# Patient Record
Sex: Female | Born: 1950
Health system: Southern US, Community
[De-identification: ages and names within clinical notes are randomized; demographics above are authoritative.]

## PROBLEM LIST (undated history)

## (undated) DIAGNOSIS — E559 Vitamin D deficiency, unspecified: Secondary | ICD-10-CM

## (undated) DIAGNOSIS — I10 Essential (primary) hypertension: Secondary | ICD-10-CM

## (undated) DIAGNOSIS — R55 Syncope and collapse: Secondary | ICD-10-CM

## (undated) DIAGNOSIS — Z780 Asymptomatic menopausal state: Secondary | ICD-10-CM

## (undated) DIAGNOSIS — R4584 Anhedonia: Secondary | ICD-10-CM

## (undated) DIAGNOSIS — Z9189 Other specified personal risk factors, not elsewhere classified: Secondary | ICD-10-CM

## (undated) DIAGNOSIS — R413 Other amnesia: Secondary | ICD-10-CM

## (undated) DIAGNOSIS — I6529 Occlusion and stenosis of unspecified carotid artery: Secondary | ICD-10-CM

## (undated) DIAGNOSIS — E785 Hyperlipidemia, unspecified: Secondary | ICD-10-CM

## (undated) DIAGNOSIS — B019 Varicella without complication: Secondary | ICD-10-CM

## (undated) DIAGNOSIS — F419 Anxiety disorder, unspecified: Secondary | ICD-10-CM

## (undated) DIAGNOSIS — M810 Age-related osteoporosis without current pathological fracture: Secondary | ICD-10-CM

## (undated) DIAGNOSIS — S43429A Sprain of unspecified rotator cuff capsule, initial encounter: Secondary | ICD-10-CM

## (undated) DIAGNOSIS — R319 Hematuria, unspecified: Secondary | ICD-10-CM

## (undated) HISTORY — DX: Sprain of unspecified rotator cuff capsule, initial encounter: S43.429A

## (undated) HISTORY — DX: Vitamin D deficiency, unspecified: E55.9

## (undated) HISTORY — DX: Other specified personal risk factors, not elsewhere classified: Z91.89

## (undated) HISTORY — DX: Age-related osteoporosis without current pathological fracture: M81.0

## (undated) HISTORY — DX: Varicella without complication: B01.9

## (undated) HISTORY — DX: Anxiety disorder, unspecified: F41.9

## (undated) HISTORY — DX: Essential (primary) hypertension: I10

## (undated) HISTORY — DX: Anhedonia: R45.84

## (undated) HISTORY — DX: Asymptomatic menopausal state: Z78.0

## (undated) HISTORY — DX: Hyperlipidemia, unspecified: E78.5

## (undated) HISTORY — DX: Other amnesia: R41.3

---
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
## 2004-08-11 LAB — HM COLONOSCOPY

## 2015-01-01 LAB — HM COLONOSCOPY

## 2017-12-19 DIAGNOSIS — R319 Hematuria, unspecified: Secondary | ICD-10-CM

## 2017-12-19 HISTORY — DX: Hematuria, unspecified: R31.9

## 2018-02-21 LAB — HM MAMMOGRAPHY

## 2018-09-28 DIAGNOSIS — R55 Syncope and collapse: Secondary | ICD-10-CM

## 2018-09-28 HISTORY — DX: Syncope and collapse: R55

## 2018-10-24 HISTORY — PX: ELECTROCARDIOGRAM: SHX264

## 2018-10-24 HISTORY — PX: US CAROTID DOPPLER BILATERAL (ARMC HX): HXRAD1402

## 2018-10-24 LAB — BASIC METABOLIC PANEL
BUN: 14 (ref 4–21)
Creatinine: 0.9 (ref 0.5–1.1)
Sodium: 133 — AB (ref 137–147)

## 2018-10-24 LAB — TSH: TSH: 1.53 (ref 0.41–5.90)

## 2018-10-24 LAB — CBC AND DIFFERENTIAL: WBC: 4.8

## 2018-10-25 HISTORY — PX: OTHER SURGICAL HISTORY: SHX169

## 2018-11-26 HISTORY — PX: TRANSTHORACIC ECHOCARDIOGRAM: SHX275

## 2019-01-10 HISTORY — PX: OTHER SURGICAL HISTORY: SHX169

## 2019-02-20 ENCOUNTER — Other Ambulatory Visit: Payer: Self-pay

## 2019-02-20 ENCOUNTER — Encounter: Payer: Self-pay | Admitting: Family Medicine

## 2019-02-20 ENCOUNTER — Ambulatory Visit (INDEPENDENT_AMBULATORY_CARE_PROVIDER_SITE_OTHER): Payer: Medicare Other | Admitting: Family Medicine

## 2019-02-20 VITALS — BP 144/88 | HR 63 | Temp 98.2°F | Resp 16 | Ht 63.39 in | Wt 155.2 lb

## 2019-02-20 DIAGNOSIS — I10 Essential (primary) hypertension: Secondary | ICD-10-CM

## 2019-02-20 DIAGNOSIS — Z0000 Encounter for general adult medical examination without abnormal findings: Secondary | ICD-10-CM

## 2019-02-20 DIAGNOSIS — H8110 Benign paroxysmal vertigo, unspecified ear: Secondary | ICD-10-CM

## 2019-02-20 DIAGNOSIS — T671XXA Heat syncope, initial encounter: Secondary | ICD-10-CM

## 2019-02-20 DIAGNOSIS — E663 Overweight: Secondary | ICD-10-CM | POA: Insufficient documentation

## 2019-02-20 DIAGNOSIS — E782 Mixed hyperlipidemia: Secondary | ICD-10-CM

## 2019-02-20 MED ORDER — ROSUVASTATIN CALCIUM 10 MG PO TABS: 10.0000 mg | ORAL_TABLET | Freq: Every day | ORAL | 1 refills | Status: DC

## 2019-02-20 MED ORDER — TRIAMTERENE-HCTZ 37.5-25 MG PO TABS: 1.0000 | ORAL_TABLET | Freq: Every day | ORAL | 1 refills | Status: DC

## 2019-02-20 MED ORDER — BUPROPION HCL ER (XL) 300 MG PO TB24: 300.0000 mg | ORAL_TABLET | Freq: Every day | ORAL | 1 refills | Status: DC

## 2019-02-20 NOTE — Patient Instructions (Addendum)
Hydrate 80 ounces of water a day.  It was a pleasure meeting you today.  Please help [place] help you:  We are honored you have chosen [facility] for your Primary Care home. Below you will find basic instructions that you may need to access in the future. Please help [place] help you by reading the instructions, which cover many of the frequent questions we experience.   Prescription refills and request:  -In order to allow more efficient response time, please call your pharmacy for all refills. They will forward the request electronically to [place]. This allows for the quickest possible response. Request left on a nurse line can take longer to refill, since these are checked as time allows between office patients and other phone calls.  - refill request can take up to 3-5 working days to complete.  - If request is sent electronically and request is appropiate, it is usually completed in 1-2 business days.  - all patients will need to be seen routinely for all chronic medical conditions requiring prescription medications (see follow-up below). If you are overdue for follow up on your condition, you will be asked to make an appointment and we will call in enough medication to cover you until your appointment (up to 30 days).  - all controlled substances will require a face to face visit to request/refill.  - if you desire your prescriptions to go through a new pharmacy, and have an active script at original pharmacy, you will need to call your pharmacy and have scripts transferred to new pharmacy. This is completed between the pharmacy locations and not by your provider.    Results: If any images or labs were ordered, it can take up to 1 week to get results depending on the test ordered and the lab/facility running and resulting the test. - Normal or stable results, which do not need further discussion, may be released to your mychart immediately with attached note to you. A call may not be generated for  normal results. Please make certain to sign up for mychart. If you have questions on how to activate your mychart you can call the front office.  - If your results need further discussion, our office will attempt to contact you via phone, and if unable to reach you after 2 attempts, we will release your abnormal result to your mychart with instructions.  - All results will be automatically released in mychart after 1 week.  - Your provider will provide you with explanation and instruction on all relevant material in your results. Please keep in mind, results and labs may appear confusing or abnormal to the untrained eye, but it does not mean they are actually abnormal for you personally. If you have any questions about your results that are not covered, or you desire more detailed explanation than what was provided, you should make an appointment with your provider to do so.   Our office handles many outgoing and incoming calls daily. If we have not contacted you within 1 week about your results, please check your mychart to see if there is a message first and if not, then contact our office.  In helping with this matter, you help decrease call volume, and therefore allow [place] to be able to respond to patients needs more efficiently.   Acute office visits (sick visit):  An acute visit is intended for a new problem and are scheduled in shorter time slots to allow schedule openings for patients with new problems.  This is the appropriate visit to discuss a new problem. Problems will not be addressed by phone call or Echart message. Appointment is needed if requesting treatment. In order to provide you with excellent quality medical care with proper time for you to explain your problem, have an exam and receive treatment with instructions, these appointments should be limited to one new problem per visit. If you experience a new problem, in which you desire to be addressed, please make an acute office visit, we  save openings on the schedule to accommodate you. Please do not save your new problem for any other type of visit, let us take care of it properly and quickly for you.   Follow up visits:  Depending on your condition(s) your provider will need to see you routinely in order to provide you with quality care and prescribe medication(s). Most chronic conditions (Example: hypertension, Diabetes, depression/anxiety... etc), require visits a couple times a year. Your provider will instruct you on proper follow up for your personal medical conditions and history. Please make certain to make follow up appointments for your condition as instructed. Failing to do so could result in lapse in your medication treatment/refills. If you request a refill, and are overdue to be seen on a condition, we will always provide you with a 30 day script (once) to allow you time to schedule.    Medicare wellness (well visit): - we have a wonderful Nurse Maudie Mercury), that will meet with you and provide you will yearly medicare wellness visits. These visits should occur yearly (can not be scheduled less than 1 calendar year apart) and cover preventive health, immunizations, advance directives and screenings you are entitled to yearly through your medicare benefits. Do not miss out on your entitled benefits, this is when medicare will pay for these benefits to be ordered for you.  These are strongly encouraged by your provider and is the appropriate type of visit to make certain you are up to date with all preventive health benefits. If you have not had your medicare wellness exam in the last 12 months, please make certain to schedule one by calling the office and schedule your medicare wellness with Maudie Mercury as soon as possible.   Yearly physical (well visit):  - Adults are recommended to be seen yearly for physicals. Check with your insurance and date of your last physical, most insurances require one calendar year between physicals. Physicals  include all preventive health topics, screenings, medical exam and labs that are appropriate for gender/age and history. You may have fasting labs needed at this visit. This is a well visit (not a sick visit), new problems should not be covered during this visit (see acute visit).  - Pediatric patients are seen more frequently when they are younger. Your provider will advise you on well child visit timing that is appropriate for your their age. - This is not a medicare wellness visit. Medicare wellness exams do not have an exam portion to the visit. Some medicare companies allow for a physical, some do not allow a yearly physical. If your medicare allows a yearly physical you can schedule the medicare wellness with our nurse Maudie Mercury and have your physical with your provider after, on the same day. Please check with insurance for your full benefits.   Late Policy/No Shows:  - all new patients should arrive 15-30 minutes earlier than appointment to allow [place] time  to  obtain all personal demographics,  insurance information and for you to complete office  paperwork. - All established patients should arrive 10-15 minutes earlier than appointment time to update all information and be checked in .  - In our best efforts to run on time, if you are late for your appointment you will be asked to either reschedule or if able, we will work you back into the schedule. There will be a wait time to work you back in the schedule,  depending on availability.  - If you are unable to make it to your appointment as scheduled, please call 24 hours ahead of time to allow [place] to fill the time slot with someone else who needs to be seen. If you do not cancel your appointment ahead of time, you may be charged a no show fee.       How to Perform the Epley Maneuver The Epley maneuver is an exercise that relieves symptoms of vertigo. Vertigo is the feeling that you or your surroundings are moving when they are not. When you feel  vertigo, you may feel like the room is spinning and have trouble walking. Dizziness is a little different than vertigo. When you are dizzy, you may feel unsteady or light-headed. You can do this maneuver at home whenever you have symptoms of vertigo. You can do it up to 3 times a day until your symptoms go away. Even though the Epley maneuver may relieve your vertigo for a few weeks, it is possible that your symptoms will return. This maneuver relieves vertigo, but it does not relieve dizziness. What are the risks? If it is done correctly, the Epley maneuver is considered safe. Sometimes it can lead to dizziness or nausea that goes away after a short time. If you develop other symptoms, such as changes in vision, weakness, or numbness, stop doing the maneuver and call your health care provider. How to perform the Epley maneuver 1. Sit on the edge of a bed or table with your back straight and your legs extended or hanging over the edge of the bed or table. 2. Turn your head halfway toward the affected ear or side. 3. Lie backward quickly with your head turned until you are lying flat on your back. You may want to position a pillow under your shoulders. 4. Hold this position for 30 seconds. You may experience an attack of vertigo. This is normal. 5. Turn your head to the opposite direction until your unaffected ear is facing the floor. 6. Hold this position for 30 seconds. You may experience an attack of vertigo. This is normal. Hold this position until the vertigo stops. 7. Turn your whole body to the same side as your head. Hold for another 30 seconds. 8. Sit back up. You can repeat this exercise up to 3 times a day. Follow these instructions at home:  After doing the Epley maneuver, you can return to your normal activities.  Ask your health care provider if there is anything you should do at home to prevent vertigo. He or she may recommend that you: ? Keep your head raised (elevated) with two or  more pillows while you sleep. ? Do not sleep on the side of your affected ear. ? Get up slowly from bed. ? Avoid sudden movements during the day. ? Avoid extreme head movement, like looking up or bending over. Contact a health care provider if:  Your vertigo gets worse.  You have other symptoms, including: ? Nausea. ? Vomiting. ? Headache. Get help right away if:  You have vision changes.  You have a severe or worsening headache or neck pain.  You cannot stop vomiting.  You have new numbness or weakness in any part of your body. Summary  Vertigo is the feeling that you or your surroundings are moving when they are not.  The Epley maneuver is an exercise that relieves symptoms of vertigo.  If the Epley maneuver is done correctly, it is considered safe. You can do it up to 3 times a day. This information is not intended to replace advice given to you by your health care provider. Make sure you discuss any questions you have with your health care provider. Document Released: 05/21/2013 Document Revised: 04/28/2017 Document Reviewed: 04/05/2016 Elsevier Patient Education  2020 [employer].

## 2019-02-20 NOTE — Progress Notes (Signed)
Patient ID: Denise Rios, female  DOB: 1951/01/13, 68 y.o.   MRN: [id number] Patient Care Team    Relationship Specialty Notifications Start End  Ma Hillock, DO PCP - General Family Medicine  02/20/19     Chief Complaint  Patient presents with  . Establish Care    patient fainted in may and she went to the hospital. nothing has happened since then. patient brough hospital notes. patient can't remember when she got her dexa, colonoscopy, pap and mamm    Subjective:  Denise Rios is a 68 y.o.  female present for new patient establishment. All past medical history, surgical history, allergies, family history, immunizations, medications and social history were updated in the electronic medical record today. All recent labs, ED visits and hospitalizations within the last year were reviewed.  Anxiety: Patient reports she started to have anxiety and dwelling on dementia about 1 year ago.  She has a strong family history of dementia on her mother side.  6 of 8 children on her mother side have been diagnosed with Alzheimer's.  Her mother died in her 61s with Alzheimer's.  She presented to her PCP with these concerns and she was started on Wellbutrin to help her with her anxiety and focus.  She feels that it is working pretty well at Wellbutrin 300 mg daily.  She states there has been some mild dose changes over the last year and she is happy with the dose she is on currently.  Hypertension/HLD/overweight: Pt reports compliance with Maxide. Blood pressures ranges at home not routinely checked. Patient denies chest pain, shortness of breath or lower extremity edema. Patient reports she was started on this medicine about a year ago.  She has consistently had a higher range blood pressures just above normal.   Syncope/vertigo: Patient reports in May she had an event where she had a syncopal episode.  She reports she had no symptoms prior to passing out other than becoming very dizzy and  then blacked out.  She states it was a an extremely hot day and they were looking for a new home in the area.  She felt extremely hot and tried to walk outside to get some air, and then was found passed out.  She states she was only passed out for less than 2 minutes.  She does think she hit her head on the sidewalk on the way down.  She was taken to the emergency room with a negative work-up.  She states she did hit her head during that time.  Since then she has had some room spinning vertigo when looking towards the left and sitting forward.  She had cardiac echo and event monitoring for work-up for her her syncope without positive findings.  She has no recurrent events.   Event monitoring 01/10/2019 -Predominant normal sinus rhythm.  The heart rate ranged from 54 to 138 bpm and the average heart rate was 75 bpm. There was no atrial fibrillation. There was rare ectopic beats. There was one 5 beat episode of nonsustained ventricular tachycardia. No bradycardia arrhythmias and no pauses. Symptoms correlate with normal sinus rhythm without ectopy.  -Transthoracic echo 11/26/2018: Left ventricle: Cavity size is normal.  Wall thickness is normal.  Systolic function is normal with an estimated EF of 60-65%. Left atrium: Volume index is normal. Right atrium: Normal in size Right ventricle: Cavity size appears normal.  Systolic function is normal.   Depression screen [facility] 2/9 02/20/2019  Decreased Interest 0  Down, Depressed, Hopeless 0  PHQ - 2 Score 0   GAD 7 : Generalized Anxiety Score 02/20/2019  Nervous, Anxious, on Edge 1  Control/stop worrying 1  Worry too much - different things 2  Trouble relaxing 0  Restless 0  Easily annoyed or irritable 0  Afraid - awful might happen 1  Total GAD 7 Score 5  Anxiety Difficulty Not difficult at all          No flowsheet data found.    There is no immunization history on file for this patient.  No exam data present  Past Medical History:   Diagnosis Date  . Anxiety   . Chicken pox   . History of fainting spells of unknown cause   . Hyperlipidemia   . Hypertension   . Syncope 09/2018   No Known Allergies Past Surgical History:  Procedure Laterality Date  . CESAREAN SECTION  1980  . event monitor  01/10/2019   Predominant normal sinus rhythm.  HR 54-1 38.  Average HR 75.  No atrial fib.  Rare ectopic beats.  5 beat episode of nonsustained ventricular tachycardia x1.  No bradycardia arrhythmias.  No pauses.  Normal sinus rhythm without ectopy.  . TRANSTHORACIC ECHOCARDIOGRAM  11/26/2018   EF 60-65%.  Normal study.  Completed presyncope.   Family History  Problem Relation Age of Onset  . Alzheimer's disease Mother        6: 8 children on her mother's side have Alzheimer's.  . Bone cancer Father   . Early death Brother   . Early death Maternal Grandfather   . Heart attack Maternal Grandfather    Social History   Social History Narrative   Marital status/children/pets: married, 2 children.    Education/employment: retired   Engineer, materials:      -smoke alarm in the home:Yes     - wears seatbelt: Yes     - Feels safe in their relationships: Yes    Allergies as of 02/20/2019   No Known Allergies     Medication List       Accurate as of February 20, 2019 11:59 PM. If you have any questions, ask your nurse or doctor.        buPROPion 300 MG 24 hr tablet Commonly known as: WELLBUTRIN XL Take 1 tablet (300 mg total) by mouth daily.   rosuvastatin 10 MG tablet Commonly known as: CRESTOR Take 1 tablet (10 mg total) by mouth at bedtime.   triamterene-hydrochlorothiazide 37.5-25 MG tablet Commonly known as: MAXZIDE-25 Take 1 tablet by mouth daily. What changed: how much to take Changed by: Howard Pouch, DO       All past medical history, surgical history, allergies, family history, immunizations andmedications were updated in the EMR today and reviewed under the history and medication portions of their EMR.     No results found for this or any previous visit (from the past 2160 hour(s)).  Patient was never admitted.   ROS: 14 pt review of systems performed and negative (unless mentioned in an HPI)  Objective: BP (!) 144/88 (BP Location: Left Arm, Patient Position: Sitting, Cuff Size: Normal)   Pulse 63   Temp 98.2 F (36.8 C) (Temporal)   Resp 16   Ht 5' 3.39" (1.61 m)   Wt 155 lb 4 oz (70.4 kg)   SpO2 99%   BMI 27.17 kg/m  Gen: Afebrile. No acute distress. Nontoxic in appearance, well-developed, well-nourished, pleasant Caucasian female. HENT: AT. [place].  MMM Eyes:Pupils Equal  Round Reactive to light, Extraocular movements intact,  Conjunctiva without redness, discharge or icterus. Neck/lymp/endocrine: Supple, no lymphadenopathy, no thyromegaly CV: RRR no murmur, no edema, +2/4 P posterior tibialis pulses.  No carotid bruits. No JVD. Chest: CTAB, no wheeze, rhonchi or crackles.  Abd: Soft. NTND. BS present.  Skin:  Warm and well-perfused. Skin intact. Neuro/Msk:  Normal gait. PERLA. EOMi. Alert. Oriented x3.   Psych: Normal affect, dress and demeanor. Normal speech. Normal thought content and judgment.   Assessment/plan: Denise Rios is a 68 y.o. female present for establish care. Essential hypertension/hyperlipidemia/overweight Mildly above goal today.  Increase Maxide to 1 tab a day from a half a tab.  She will monitor her blood pressure to make sure less than 135/85.  If above goal she will make an appointment to follow-up. -Low-sodium diet and exercise encouraged. - Comp Met (CMET); Future - CBC; Future - TSH; Future - Lipid panel; Future -Follow-up 6 months  Heat syncope, initial encounter -Likely syncope secondary to heat exertion.  Has not recurred and her work-up was normal from a cardiac standpoint.  Benign paroxysmal positional vertigo, unspecified laterality Patient having vertigo symptoms with laying back or sitting up after laying down and looking towards the  left.  Possibly secondary to her syncopal episode and hitting her head.  Discussed further work-up with neurology versus CT of her head.  Also considered vestibular rehab.  She would like to monitor for now.  -Discussed adequate hydration can play a role in BPPV -Patient was given instructions on the Epley maneuver. -Follow-up if desires further work-up.   Greater than 45 minutes was spent with patient, greater than 50% of that time was spent face-to-face    Follow-up 6 months on chronic medical condition.  Note is dictated utilizing voice recognition software. Although note has been proof read prior to signing, occasional typographical errors still can be missed. If any questions arise, please do not hesitate to call for verification.  Electronically signed by: Howard Pouch, DO [place]

## 2019-02-21 ENCOUNTER — Encounter: Payer: Self-pay | Admitting: Family Medicine

## 2019-02-21 DIAGNOSIS — E785 Hyperlipidemia, unspecified: Secondary | ICD-10-CM | POA: Insufficient documentation

## 2019-02-21 DIAGNOSIS — H8110 Benign paroxysmal vertigo, unspecified ear: Secondary | ICD-10-CM | POA: Insufficient documentation

## 2019-02-21 DIAGNOSIS — I10 Essential (primary) hypertension: Secondary | ICD-10-CM | POA: Insufficient documentation

## 2019-02-27 ENCOUNTER — Other Ambulatory Visit: Payer: Self-pay

## 2019-02-27 ENCOUNTER — Ambulatory Visit (INDEPENDENT_AMBULATORY_CARE_PROVIDER_SITE_OTHER): Payer: Medicare Other | Admitting: Family Medicine

## 2019-02-27 DIAGNOSIS — E782 Mixed hyperlipidemia: Secondary | ICD-10-CM | POA: Diagnosis not present

## 2019-02-27 DIAGNOSIS — I10 Essential (primary) hypertension: Secondary | ICD-10-CM

## 2019-02-27 LAB — LIPID PANEL
Cholesterol: 211 mg/dL — ABNORMAL HIGH (ref 0–200)
HDL: 86.6 mg/dL (ref >39.00)
LDL Cholesterol: 108 mg/dL — ABNORMAL HIGH (ref 0–99)
NonHDL: 124.11
Total CHOL/HDL Ratio: 2
Triglycerides: 80 mg/dL (ref 0.0–149.0)
VLDL: 16 mg/dL (ref 0.0–40.0)

## 2019-02-27 LAB — CBC
HCT: 40.1 % (ref 36.0–46.0)
Hemoglobin: 13.1 g/dL (ref 12.0–15.0)
MCHC: 32.8 g/dL (ref 30.0–36.0)
MCV: 86.9 fl (ref 78.0–100.0)
Platelets: 211 10*3/uL (ref 150.0–400.0)
RBC: 4.61 Mil/uL (ref 3.87–5.11)
RDW: 13.6 % (ref 11.5–15.5)
WBC: 4 10*3/uL (ref 4.0–10.5)

## 2019-02-27 LAB — COMPREHENSIVE METABOLIC PANEL
ALT: 13 U/L (ref 0–35)
AST: 17 U/L (ref 0–37)
Albumin: 4.3 g/dL (ref 3.5–5.2)
Alkaline Phosphatase: 58 U/L (ref 39–117)
BUN: 15 mg/dL (ref 6–23)
CO2: 28 mEq/L (ref 19–32)
Calcium: 10 mg/dL (ref 8.4–10.5)
Chloride: 97 mEq/L (ref 96–112)
Creatinine, Ser: 0.97 mg/dL (ref 0.40–1.20)
GFR: 57.38 mL/min — ABNORMAL LOW (ref >60.00)
Glucose, Bld: 82 mg/dL (ref 70–99)
Potassium: 4.3 mEq/L (ref 3.5–5.1)
Sodium: 134 mEq/L — ABNORMAL LOW (ref 135–145)
Total Bilirubin: 0.4 mg/dL (ref 0.2–1.2)
Total Protein: 6.6 g/dL (ref 6.0–8.3)

## 2019-02-27 LAB — TSH: TSH: 1.95 u[IU]/mL (ref 0.35–4.50)

## 2019-03-05 ENCOUNTER — Encounter: Payer: Self-pay | Admitting: Family Medicine

## 2019-03-19 ENCOUNTER — Encounter: Payer: Self-pay | Admitting: Family Medicine

## 2019-03-19 DIAGNOSIS — I6529 Occlusion and stenosis of unspecified carotid artery: Secondary | ICD-10-CM

## 2019-03-19 DIAGNOSIS — M810 Age-related osteoporosis without current pathological fracture: Secondary | ICD-10-CM | POA: Insufficient documentation

## 2019-03-19 HISTORY — DX: Occlusion and stenosis of unspecified carotid artery: I65.29

## 2019-06-21 ENCOUNTER — Ambulatory Visit: Payer: Medicare Other | Attending: Internal Medicine

## 2019-06-21 ENCOUNTER — Ambulatory Visit: Payer: Medicare Other

## 2019-06-21 DIAGNOSIS — Z23 Encounter for immunization: Secondary | ICD-10-CM | POA: Insufficient documentation

## 2019-06-21 NOTE — Progress Notes (Signed)
   Covid-19 Vaccination Clinic  Name:  Denise Rios    MRN: [id number] DOB: [birth year]  06/21/2019  Denise Rios was observed post Covid-19 immunization for 15 minutes without incidence. She was provided with Vaccine Information Sheet and instruction to access the V-Safe system.   Denise Rios was instructed to call 911 with any severe reactions post vaccine: Marland Kitchen Difficulty breathing  . Swelling of your face and throat  . A fast heartbeat  . A bad rash all over your body  . Dizziness and weakness    Immunizations Administered    Name Date Dose VIS Date Route   Pfizer COVID-19 Vaccine 06/21/2019  2:47 PM 0.3 mL 05/10/2019 Intramuscular   Manufacturer: [employer], [employer]   Lot: [id number]   NDC: [address]-0

## 2019-07-11 ENCOUNTER — Ambulatory Visit: Payer: Medicare Other | Attending: Internal Medicine

## 2019-07-11 DIAGNOSIS — Z23 Encounter for immunization: Secondary | ICD-10-CM

## 2019-07-11 NOTE — Progress Notes (Signed)
   Covid-19 Vaccination Clinic  Name:  TIAUNNA BUFORD    MRN: [id number] DOB: 1950-07-05  07/11/2019  Ms. Laneve was observed post Covid-19 immunization for 15 minutes without incidence. She was provided with Vaccine Information Sheet and instruction to access the V-Safe system.   Ms. Ornstein was instructed to call 911 with any severe reactions post vaccine: Marland Kitchen Difficulty breathing  . Swelling of your face and throat  . A fast heartbeat  . A bad rash all over your body  . Dizziness and weakness    Immunizations Administered    Name Date Dose VIS Date Route   Pfizer COVID-19 Vaccine 07/11/2019  1:04 PM 0.3 mL 05/10/2019 Intramuscular   Manufacturer: [employer], [employer]   Lot: [id number]   NDC: [address]-7

## 2019-08-16 ENCOUNTER — Telehealth: Payer: Self-pay

## 2019-08-16 MED ORDER — TRIAMTERENE-HCTZ 37.5-25 MG PO TABS: 1.0000 | ORAL_TABLET | Freq: Every day | ORAL | 0 refills | Status: DC

## 2019-08-16 NOTE — Telephone Encounter (Signed)
Pt was called and she scheduled F/U appt with Provider. 30 day supply sent to pharmacy.

## 2019-08-16 NOTE — Telephone Encounter (Signed)
Patient refill meds    triamterene-hydrochlorothiazide (MAXZIDE-25) 37.5-25 MG tablet [[id number]]   [id number] - [place], 

## 2019-08-23 ENCOUNTER — Encounter: Payer: Self-pay | Admitting: Family Medicine

## 2019-08-23 ENCOUNTER — Ambulatory Visit (INDEPENDENT_AMBULATORY_CARE_PROVIDER_SITE_OTHER): Payer: Medicare Other | Admitting: Family Medicine

## 2019-08-23 ENCOUNTER — Other Ambulatory Visit: Payer: Self-pay

## 2019-08-23 VITALS — BP 142/90 | HR 71 | Temp 98.2°F | Resp 16 | Ht 63.0 in | Wt 156.0 lb

## 2019-08-23 DIAGNOSIS — E663 Overweight: Secondary | ICD-10-CM

## 2019-08-23 DIAGNOSIS — E782 Mixed hyperlipidemia: Secondary | ICD-10-CM | POA: Diagnosis not present

## 2019-08-23 DIAGNOSIS — I10 Essential (primary) hypertension: Secondary | ICD-10-CM | POA: Diagnosis not present

## 2019-08-23 MED ORDER — ROSUVASTATIN CALCIUM 10 MG PO TABS: 10.0000 mg | ORAL_TABLET | Freq: Every day | ORAL | 1 refills | Status: DC

## 2019-08-23 MED ORDER — BUPROPION HCL ER (XL) 300 MG PO TB24: 300.0000 mg | ORAL_TABLET | Freq: Every day | ORAL | 1 refills | Status: DC

## 2019-08-23 MED ORDER — LISINOPRIL 5 MG PO TABS: 5.0000 mg | ORAL_TABLET | Freq: Every day | ORAL | 1 refills | Status: DC

## 2019-08-23 NOTE — Progress Notes (Signed)
Patient ID: Denise Rios, female  DOB: [birth year], 69 y.o.   MRN: [id number] Patient Care Team    Relationship Specialty Notifications Start End  Natalia Leatherwood, DO PCP - General Family Medicine  02/20/19     Chief Complaint  Patient presents with  . Anxiety    Needs refills   . Hypertension    Subjective: Denise Rios is a 69 y.o.  female present for chronic medical condition follow-up Anxiety: Patient reports she started to have anxiety and dwelling on dementia about 1 year ago.  She has a strong family history of dementia on her mother side.  6 of 8 children on her mother side have been diagnosed with Alzheimer's.  Her mother died in her 1s with Alzheimer's.  She presented to her PCP with these concerns and she was started on Wellbutrin to help her with her anxiety and focus.   Patient reports she was she did not have to take the medication but feels it is working for her and is fearful to lower dose or discontinue.  He would like to remain on current dose.  Hypertension/HLD/overweight: Pt reports compliance with Maxide. Blood pressures ranges at home not routinely checked. Patient denies chest pain, shortness of breath, dizziness or lower extremity edema.   Event monitoring 01/10/2019 -Predominant normal sinus rhythm.  The heart rate ranged from 54 to 138 bpm and the average heart rate was 75 bpm. There was no atrial fibrillation. There was rare ectopic beats. There was one 5 beat episode of nonsustained ventricular tachycardia. No bradycardia arrhythmias and no pauses. Symptoms correlate with normal sinus rhythm without ectopy.  -Transthoracic echo 11/26/2018: Left ventricle: Cavity size is normal.  Wall thickness is normal.  Systolic function is normal with an estimated EF of 60-65%. Left atrium: Volume index is normal. Right atrium: Normal in size Right ventricle: Cavity size appears normal.  Systolic function is normal.   Depression screen Denise Rios 2/9 08/23/2019  02/20/2019  Decreased Interest 0 0  Down, Depressed, Hopeless 0 0  PHQ - 2 Score 0 0   GAD 7 : Generalized Anxiety Score 08/23/2019 02/20/2019  Nervous, Anxious, on Edge 0 1  Control/stop worrying 0 1  Worry too much - different things 0 2  Trouble relaxing 0 0  Restless 0 0  Easily annoyed or irritable 0 0  Afraid - awful might happen 0 1  Total GAD 7 Score 0 5  Anxiety Difficulty Not difficult at all Not difficult at all       No flowsheet data found.   Immunization History  Administered Date(s) Administered  . PFIZER SARS-COV-2 Vaccination 06/21/2019, 07/11/2019  . Zoster Recombinat (Shingrix) 03/26/2013    No exam data present  Past Medical History:  Diagnosis Date  . Anhedonia   . Anxiety   . Arteriosclerosis of carotid artery 03/19/2019   Less than 50%  . Chicken pox   . Hematuria 12/19/2017  . History of fainting spells of unknown cause   . Hyperlipidemia   . Hypertension   . Memory loss    prioor PCP records indicate neuro referral was made  . Menopause   . Osteoporosis   . Rotator cuff (capsule) sprain    R>L  . Syncope 09/2018  . Vitamin D deficiency    No Known Allergies Past Surgical History:  Procedure Laterality Date  . CESAREAN SECTION  1980  . ELECTROCARDIOGRAM  10/24/2018   SR. HR74, PR 167, QTC 406, NL-EKG  .  event monitor  01/10/2019   Predominant normal sinus rhythm.  HR 54-1 38.  Average HR 75.  No atrial fib.  Rare ectopic beats.  5 beat episode of nonsustained ventricular tachycardia x1.  No bradycardia arrhythmias.  No pauses.  Normal sinus rhythm without ectopy.  . Image: CT chest  10/25/2018   normal- r/o PE  . TRANSTHORACIC ECHOCARDIOGRAM  11/26/2018   EF 60-65%.  Normal study.  Completed presyncope.  . US CAROTID DOPPLER BILATERAL ([facility] HX)  10/24/2018   Arteriolosclerosis.  Findings consistent with less than 50% stenosis.  Bilateral vertebral blood flow demonstrated.   Family History  Problem Relation Age of Onset  .  Alzheimer's disease Mother        6: 8 children on her mother's side have Alzheimer's.  . Bone cancer Father   . Early death Brother   . Early death Maternal Grandfather   . Heart attack Maternal Grandfather    Social History   Social History Narrative   Marital status/children/pets: married, 2 children.    Education/employment: retired   Engineer, materials:      -smoke alarm in the home:Yes     - wears seatbelt: Yes     - Feels safe in their relationships: Yes    Allergies as of 08/23/2019   No Known Allergies     Medication List       Accurate as of August 23, 2019  3:01 PM. If you have any questions, ask your nurse or doctor.        buPROPion 300 MG 24 hr tablet Commonly known as: WELLBUTRIN XL Take 1 tablet (300 mg total) by mouth daily.   Durezol 0.05 % Emul Generic drug: Difluprednate Place 1 drop into the left eye 3 (three) times daily.   Prolensa 0.07 % Soln Generic drug: Bromfenac Sodium   rosuvastatin 10 MG tablet Commonly known as: CRESTOR Take 1 tablet (10 mg total) by mouth at bedtime.   triamterene-hydrochlorothiazide 37.5-25 MG tablet Commonly known as: MAXZIDE-25 Take 1 tablet by mouth daily.       All past medical history, surgical history, allergies, family history, immunizations andmedications were updated in the EMR today and reviewed under the history and medication portions of their EMR.    No results found for this or any previous visit (from the past 2160 hour(s)).  Patient was never admitted.   ROS: 14 pt review of systems performed and negative (unless mentioned in an HPI)  Objective: BP (!) 142/90 (BP Location: Right Arm, Patient Position: Sitting, Cuff Size: Normal)   Pulse 71   Temp 98.2 F (36.8 C) (Temporal)   Resp 16   Ht 5\' 3"  (1.6 m)   Wt 156 lb (70.8 kg)   SpO2 98%   BMI 27.63 kg/m  Gen: Afebrile. No acute distress.  HENT: AT. [place].  Eyes:Pupils Equal Round Reactive to light, Extraocular movements intact,  Conjunctiva without  redness, discharge or icterus. Neck/lymp/endocrine: Supple, no lymphadenopathy, no thyromegaly CV: RRR no murmur, no edema Chest: CTAB, no wheeze or crackles Neuro:  Normal gait. PERLA. EOMi. Alert. Oriented x3  Psych: Normal affect, dress and demeanor. Normal speech. Normal thought content and judgment.   Assessment/plan: TAFFIE ECKMANN is a 69 y.o. female present for establish care. Essential hypertension/hyperlipidemia/overweight Blood pressures are still above goal despite increase in Maxide last appointment.   Continue Maxide 1 tab daily  Start lisinopril 5 mg daily Continue low-dose Crestor -Low-sodium diet and exercise encouraged. Follow-up 5.5 months for Medicare  wellness along with chronic medical conditions and labs will be collected at that visit.  Return in about 6 months (around 02/23/2020) for medicare wellness (45 min), [facility] (30 min). No orders of the defined types were placed in this encounter.  Meds ordered this encounter  Medications  . buPROPion (WELLBUTRIN XL) 300 MG 24 hr tablet    Sig: Take 1 tablet (300 mg total) by mouth daily.    Dispense:  90 tablet    Refill:  1  . rosuvastatin (CRESTOR) 10 MG tablet    Sig: Take 1 tablet (10 mg total) by mouth at bedtime.    Dispense:  90 tablet    Refill:  1  . lisinopril (ZESTRIL) 5 MG tablet    Sig: Take 1 tablet (5 mg total) by mouth daily.    Dispense:  90 tablet    Refill:  1   Referral Orders  No referral(s) requested today       Note is dictated utilizing voice recognition software. Although note has been proof read prior to signing, occasional typographical errors still can be missed. If any questions arise, please do not hesitate to call for verification.  Electronically signed by: Felix Pacini, DO [facility] [place]

## 2019-08-23 NOTE — Patient Instructions (Signed)
Great to see you today.  I have refilled your meds. Start the lisinopril for added blood pressure protection.    Follow in 6 months

## 2019-08-26 ENCOUNTER — Other Ambulatory Visit: Payer: Self-pay | Admitting: Family Medicine

## 2019-08-27 NOTE — Telephone Encounter (Signed)
MyChart message read.

## 2019-09-13 ENCOUNTER — Other Ambulatory Visit: Payer: Self-pay | Admitting: Family Medicine

## 2019-09-16 ENCOUNTER — Encounter: Payer: Self-pay | Admitting: Family Medicine

## 2019-09-16 NOTE — Telephone Encounter (Signed)
Pt started Lisinopril 08/23/2019. Please advise.

## 2019-09-17 MED ORDER — AMLODIPINE BESYLATE 2.5 MG PO TABS: 2.5000 mg | ORAL_TABLET | Freq: Every day | ORAL | 1 refills | Status: DC

## 2019-09-17 NOTE — Telephone Encounter (Signed)
Spoke w/ Pt- informed of recommendations. Pt verbalized understanding.  

## 2019-09-17 NOTE — Telephone Encounter (Signed)
Please call patient Sorry she is having a side effect of the medication.  The only way to improve the symptoms is to discontinue the medication and start a different blood pressure pill.  Please have her discontinue the lisinopril and start the new medication called amlodipine daily.  Keep same timeline for follow-up per last office visit note.

## 2019-09-17 NOTE — Addendum Note (Signed)
Addended by: Felix Pacini A on: 09/17/2019 12:17 PM   Modules accepted: Orders

## 2019-11-27 ENCOUNTER — Encounter: Payer: Self-pay | Admitting: Family Medicine

## 2019-11-27 ENCOUNTER — Other Ambulatory Visit: Payer: Self-pay

## 2019-11-27 ENCOUNTER — Ambulatory Visit (INDEPENDENT_AMBULATORY_CARE_PROVIDER_SITE_OTHER): Payer: Medicare Other | Admitting: Family Medicine

## 2019-11-27 VITALS — BP 131/85 | HR 73 | Temp 98.3°F | Resp 17 | Ht 63.0 in | Wt 155.5 lb

## 2019-11-27 DIAGNOSIS — R059 Cough, unspecified: Secondary | ICD-10-CM

## 2019-11-27 DIAGNOSIS — R05 Cough: Secondary | ICD-10-CM

## 2019-11-27 MED ORDER — CETIRIZINE HCL 10 MG PO TABS: 10.0000 mg | ORAL_TABLET | Freq: Every day | ORAL | 11 refills | Status: DC

## 2019-11-27 MED ORDER — FLUTICASONE PROPIONATE 50 MCG/ACT NA SUSP
2.0000 | Freq: Every day | NASAL | 6 refills | Status: DC
Start: 2019-11-27 — End: 2020-08-12

## 2019-11-27 NOTE — Progress Notes (Signed)
This visit occurred during the SARS-CoV-2 public health emergency.  Safety protocols were in place, including screening questions prior to the visit, additional usage of staff PPE, and extensive cleaning of exam room while observing appropriate contact time as indicated for disinfecting solutions.    Denise Rios , Aug 05, 1950, 69 y.o., female MRN: [id number] Patient Care Team    Relationship Specialty Notifications Start End  Natalia Leatherwood, DO PCP - General Family Medicine  02/20/19     Chief Complaint  Patient presents with  . Cough    Pt is going to [place] and is worried about her chronic cough. Dry cough.      Subjective: Pt presents for an OV with complaints of daily cough since around March. She was started on lisinopril the day prior to cough onset. Lisinopril was stopped immediately and switched to amlodipine. Pt reports cough has remained. Can occur anytime throughout the day, but worse when she first lays down at night. Cough is not productive, no fever, chills, sinus/allergy symptoms or reflux symptoms. She is not taking an antihistamine. She has had a h/o reflux in the past, with more classic symptoms.   Depression screen [facility] 2/9 08/23/2019 02/20/2019  Decreased Interest 0 0  Down, Depressed, Hopeless 0 0  PHQ - 2 Score 0 0    Allergies  Allergen Reactions  . Lisinopril Cough   Social History   Social History Narrative   Marital status/children/pets: married, 2 children.    Education/employment: retired   Field seismologist:      -smoke alarm in the home:Yes     - wears seatbelt: Yes     - Feels safe in their relationships: Yes   Past Medical History:  Diagnosis Date  . Anhedonia   . Anxiety   . Arteriosclerosis of carotid artery 03/19/2019   Less than 50%  . Chicken pox   . Hematuria 12/19/2017  . History of fainting spells of unknown cause   . Hyperlipidemia   . Hypertension   . Memory loss    prioor PCP records indicate neuro referral was made  .  Menopause   . Osteoporosis   . Rotator cuff (capsule) sprain    R>L  . Syncope 09/2018  . Vitamin D deficiency    Past Surgical History:  Procedure Laterality Date  . CESAREAN SECTION  1980  . ELECTROCARDIOGRAM  10/24/2018   SR. HR74, PR 167, QTC 406, NL-EKG  . event monitor  01/10/2019   Predominant normal sinus rhythm.  HR 54-1 38.  Average HR 75.  No atrial fib.  Rare ectopic beats.  5 beat episode of nonsustained ventricular tachycardia x1.  No bradycardia arrhythmias.  No pauses.  Normal sinus rhythm without ectopy.  . Image: CT chest  10/25/2018   normal- r/o PE  . TRANSTHORACIC ECHOCARDIOGRAM  11/26/2018   EF 60-65%.  Normal study.  Completed presyncope.  . US CAROTID DOPPLER BILATERAL ([facility] HX)  10/24/2018   Arteriolosclerosis.  Findings consistent with less than 50% stenosis.  Bilateral vertebral blood flow demonstrated.   Family History  Problem Relation Age of Onset  . Alzheimer's disease Mother        6: 8 children on her mother's side have Alzheimer's.  . Bone cancer Father   . Early death Brother   . Early death Maternal Grandfather   . Heart attack Maternal Grandfather    Allergies as of 11/27/2019      Reactions   Lisinopril Cough  Medication List       Accurate as of November 27, 2019 11:25 AM. If you have any questions, ask your nurse or doctor.        amLODipine 2.5 MG tablet Commonly known as: NORVASC Take 1 tablet (2.5 mg total) by mouth daily.   buPROPion 300 MG 24 hr tablet Commonly known as: WELLBUTRIN XL Take 1 tablet (300 mg total) by mouth daily.   Durezol 0.05 % Emul Generic drug: Difluprednate Place 1 drop into the left eye 3 (three) times daily.   Prolensa 0.07 % Soln Generic drug: Bromfenac Sodium   rosuvastatin 10 MG tablet Commonly known as: CRESTOR Take 1 tablet (10 mg total) by mouth at bedtime.   triamterene-hydrochlorothiazide 37.5-25 MG tablet Commonly known as: MAXZIDE-25 TAKE 1 TABLET BY MOUTH DAILY       All  past medical history, surgical history, allergies, family history, immunizations andmedications were updated in the EMR today and reviewed under the history and medication portions of their EMR.     ROS: Negative, with the exception of above mentioned in HPI   Objective:  BP 131/85 (BP Location: Left Arm, Patient Position: Sitting, Cuff Size: Normal)   Pulse 73   Temp 98.3 F (36.8 C) (Temporal)   Resp 17   Ht 5\' 3"  (1.6 m)   Wt 155 lb 8 oz (70.5 kg)   SpO2 99%   BMI 27.55 kg/m  Body mass index is 27.55 kg/m. Gen: Afebrile. No acute distress. Nontoxic in appearance, well developed, well nourished. pleasant female.  HENT: AT. [place]. Bilateral TM visualized w/o erythema or effusions.  MMM, no oral lesions. Bilateral nares without erythema, no swelling, mild drainage present.  Throat without erythema or exudates. No cough or hoarseness on exam.  Eyes:Pupils Equal Round Reactive to light, Extraocular movements intact,  Conjunctiva without redness, discharge or icterus. Neck/lymp/endocrine: Supple,no lymphadenopathy  No exam data present No results found. No results found for this or any previous visit (from the past 24 hour(s)).  Assessment/Plan: Denise Rios is a 69 y.o. female present for OV for  Cough Initially cough was thought to be from her start of lisinopril 3 months ago. However tickle cough has remained and occurs daily.  - Start zyrtec QHS - start Flonase nasal spray.  - consider OTC prilosec for silent reflux coverage.  - consider mucinex DM. - Discussed 2 most common causes of noninfectious cough such as hers is allergies vs GERD.   Reviewed expectations re: course of current medical issues.  Discussed self-management of symptoms.  Outlined signs and symptoms indicating need for more acute intervention.  Patient verbalized understanding and all questions were answered.  Patient received an After-Visit Summary.    No orders of the defined types were placed in  this encounter.  No orders of the defined types were placed in this encounter.  Referral Orders  No referral(s) requested today     Note is dictated utilizing voice recognition software. Although note has been proof read prior to signing, occasional typographical errors still can be missed. If any questions arise, please do not hesitate to call for verification.   electronically signed by:  81, DO   [facility] [place]

## 2019-11-27 NOTE — Patient Instructions (Addendum)
Start zyrtec before bed.  Flonase nasal spray each nostril twice a day.   If needed you could elect to go ahead treat the second possible cause of silent reflux with OTC the Prilosec once daily for 2 weeks.   2 most common causes are al;lergies draining down or reflux creeping up.

## 2019-12-18 ENCOUNTER — Other Ambulatory Visit: Payer: Self-pay | Admitting: Family Medicine

## 2020-02-08 ENCOUNTER — Other Ambulatory Visit: Payer: Self-pay | Admitting: Family Medicine

## 2020-03-11 ENCOUNTER — Other Ambulatory Visit: Payer: Self-pay | Admitting: Family Medicine

## 2020-03-16 ENCOUNTER — Other Ambulatory Visit: Payer: Self-pay | Admitting: Family Medicine

## 2020-03-30 ENCOUNTER — Ambulatory Visit (INDEPENDENT_AMBULATORY_CARE_PROVIDER_SITE_OTHER): Payer: Medicare Other | Admitting: Family Medicine

## 2020-03-30 ENCOUNTER — Other Ambulatory Visit: Payer: Self-pay

## 2020-03-30 ENCOUNTER — Encounter: Payer: Self-pay | Admitting: Family Medicine

## 2020-03-30 ENCOUNTER — Ambulatory Visit (HOSPITAL_BASED_OUTPATIENT_CLINIC_OR_DEPARTMENT_OTHER)
Admission: RE | Admit: 2020-03-30 | Discharge: 2020-03-30 | Disposition: A | Discharge status: Home / Self Care | Payer: Medicare Other | Source: Ambulatory Visit | Attending: Family Medicine | Admitting: Family Medicine

## 2020-03-30 VITALS — BP 121/77 | HR 66 | Temp 98.2°F | Ht 62.0 in | Wt 144.0 lb

## 2020-03-30 DIAGNOSIS — M545 Low back pain, unspecified: Secondary | ICD-10-CM | POA: Diagnosis present

## 2020-03-30 MED ORDER — DICLOFENAC SODIUM 75 MG PO TBEC: 75.0000 mg | DELAYED_RELEASE_TABLET | Freq: Two times a day (BID) | ORAL | 5 refills | Status: DC

## 2020-03-30 NOTE — Patient Instructions (Addendum)
Start diclofenac every 12 hours with food.  Have xray completed today and we will call you with results.   If normal we will also refer you to PT.    Lumbar Sprain A lumbar sprain, which is sometimes called a low-back sprain, is a stretch or tear in the bands of tissue that hold bones and joints together (ligaments) in the lower back (lumbar spine). This type of injury occurs when you overextend or stretch these ligaments beyond their limits. Lumbar sprains can range from mild to severe. Mild sprains may involve stretching a ligament without tearing it. These may heal in 1-2 weeks. More severe sprains involve tearing of the ligament. These will cause more pain and may take 6-8 weeks to heal. What are the causes? This condition may be caused by:  Trauma, such as a fall or a hit to the body.  Twisting or overstretching the back. This may result from doing activities that need a lot of energy, such as lifting heavy objects. What increases the risk? This injury is more common in:  Athletes.  People with obesity.  People who do repeated lifting, bending, or other movements that involve their back. What are the signs or symptoms? Symptoms of this condition may include:  Sharp or dull pain in the lower back that does not go away. The pain may extend to the buttocks.  Stiffness or limited range of motion.  Sudden muscle tightening (spasms). How is this diagnosed? This condition may be diagnosed based on:  Your symptoms.  Your medical history.  A physical exam.  Imaging tests, such as: ? X-rays. ? MRI. How is this treated? Treatment for this condition may include:  Resting the injured area.  Applying heat and cold to the affected area.  Medicines for pain and inflammation, such as NSAIDs.  Prescription pain medicine and muscle relaxants may be needed for a short time.  Physical therapy. Follow these instructions at home: Managing pain, stiffness, and swelling       If directed, put ice on the injured area during the first 24 hours after your injury. ? Put ice in a plastic bag. ? Place a towel between your skin and the bag. ? Leave the ice on for 20 minutes, 2-3 times a day.  If directed, apply heat to the affected area as often as told by your health care provider. Use the heat source that your health care provider recommends, such as a moist heat pack or a heating pad. ? Place a towel between your skin and the heat source. ? Leave the heat on for 20-30 minutes. ? Remove the heat if your skin turns bright red. This is especially important if you are unable to feel pain, heat, or cold. You may have a greater risk of getting burned. Activity  Rest and return to your normal activities as told by your health care provider. Ask your health care provider what activities are safe for you.  Do exercises as told by your health care provider. Medicines  Take over-the-counter and prescription medicines only as told by your health care provider.  Ask your health care provider if the medicine prescribed to you: ? Requires you to avoid driving or using heavy machinery. ? Can cause constipation. You may need to take these actions to prevent or treat constipation:  Drink enough fluid to keep your urine pale yellow.  Take over-the-counter or prescription medicines.  Eat foods that are high in fiber, such as beans, whole grains, and fresh  fruits and vegetables.  Limit foods that are high in fat and processed sugars, such as fried or sweet foods. Injury prevention To prevent a future low-back injury:  Always warm up properly before physical activity or sports.  Cool down and stretch after being active.  Use correct form when playing sports and lifting heavy objects. Bend your knees before you lift heavy objects.  Use good posture when sitting and standing.  Stay physically fit and keep a healthy weight. ? Do at least 150 minutes of  moderate-intensity exercise each week, such as brisk walking or water aerobics. ? Do strength exercises at least 2 times each week.  General instructions  Do not use any products that contain nicotine or tobacco, such as cigarettes, e-cigarettes, and chewing tobacco. If you need help quitting, ask your health care provider.  Keep all follow-up visits as told by your health care provider. This is important. Contact a health care provider if:  Your back pain does not improve after 6 weeks of treatment.  Your symptoms get worse. Get help right away if:  Your back pain is severe.  You are unable to stand or walk.  You develop pain in your legs.  You develop weakness in your buttocks or legs.  You have difficulty controlling when you urinate or when you have a bowel movement. ? You have frequent, painful, or bloody urination. ? You have a temperature over 101.93F (38.3C). Summary  A lumbar sprain, which is sometimes called a low-back sprain, is a stretch or tear in the bands of tissue that hold bones and joints together (ligaments) in the lower back (lumbar spine).  Rest and return to your normal activities as told by your health care provider. Ask your health care provider what activities are safe for you.  If directed, apply heat and ice to the affected area as often as told by your health care provider.  Take over-the-counter and prescription medicines only as told by your health care provider.  Contact a health care provider if you have new or worsening symptoms. This information is not intended to replace advice given to you by your health care provider. Make sure you discuss any questions you have with your health care provider. Document Revised: 03/15/2018 Document Reviewed: 03/15/2018 Elsevier Patient Education  2020 [employer].

## 2020-03-30 NOTE — Progress Notes (Signed)
This visit occurred during the SARS-CoV-2 public health emergency.  Safety protocols were in place, including screening questions prior to the visit, additional usage of staff PPE, and extensive cleaning of exam room while observing appropriate contact time as indicated for disinfecting solutions.    Denise Rios , 05-01-1951, 69 y.o., female MRN: [id number] Patient Care Team    Relationship Specialty Notifications Start End  Natalia Leatherwood, DO PCP - General Family Medicine  02/20/19     Chief Complaint  Patient presents with  . Back Pain    pt c/o mid to LBP x years but recently worsen within the last 2 week, but states it is worse when she stands; pt describes pain as moderate dull throbbing pain     Subjective: Pt presents for an OV with complaints of low back pain for many years.  She states she would describe it as her back feeling "tired."She would rest and sit down and the symptoms would resolve.  However,  over the last 2 weeks she has noticed worsening back discomfort in her lower lumbar spine.  She reports the pain is on both sides of her lower back and she feels like her back is spasming whenever she stands for even a few minutes at a time.  Denies any increased activity, lifting or injury over the past 2 weeks or prior to her back.  She denies any prior back surgeries.  She denies any radiation of pain anteriorly or down her legs.  She denies any bladder or bowel discomfort.  She describes the pain as a dull throbbing pain with occasional muscle spasm.  Depression screen [facility] 2/9 03/30/2020 08/23/2019 02/20/2019  Decreased Interest 0 0 0  Down, Depressed, Hopeless 0 0 0  PHQ - 2 Score 0 0 0    Allergies  Allergen Reactions  . Lisinopril Cough   Social History   Social History Narrative   Marital status/children/pets: married, 2 children.    Education/employment: retired   Field seismologist:      -smoke alarm in the home:Yes     - wears seatbelt: Yes     - Feels safe in their  relationships: Yes   Past Medical History:  Diagnosis Date  . Anhedonia   . Anxiety   . Arteriosclerosis of carotid artery 03/19/2019   Less than 50%  . Chicken pox   . Hematuria 12/19/2017  . History of fainting spells of unknown cause   . Hyperlipidemia   . Hypertension   . Memory loss    prioor PCP records indicate neuro referral was made  . Menopause   . Osteoporosis   . Rotator cuff (capsule) sprain    R>L  . Syncope 09/2018  . Vitamin D deficiency    Past Surgical History:  Procedure Laterality Date  . CESAREAN SECTION  1980  . ELECTROCARDIOGRAM  10/24/2018   SR. HR74, PR 167, QTC 406, NL-EKG  . event monitor  01/10/2019   Predominant normal sinus rhythm.  HR 54-1 38.  Average HR 75.  No atrial fib.  Rare ectopic beats.  5 beat episode of nonsustained ventricular tachycardia x1.  No bradycardia arrhythmias.  No pauses.  Normal sinus rhythm without ectopy.  . Image: CT chest  10/25/2018   normal- r/o PE  . TRANSTHORACIC ECHOCARDIOGRAM  11/26/2018   EF 60-65%.  Normal study.  Completed presyncope.  . US CAROTID DOPPLER BILATERAL ([facility] HX)  10/24/2018   Arteriolosclerosis.  Findings consistent with less than 50%  stenosis.  Bilateral vertebral blood flow demonstrated.   Family History  Problem Relation Age of Onset  . Alzheimer's disease Mother        6: 8 children on her mother's side have Alzheimer's.  . Bone cancer Father   . Early death Brother   . Early death Maternal Grandfather   . Heart attack Maternal Grandfather    Allergies as of 03/30/2020      Reactions   Lisinopril Cough      Medication List       Accurate as of March 30, 2020  1:33 PM. If you have any questions, ask your nurse or doctor.        STOP taking these medications   Durezol 0.05 % Emul Generic drug: Difluprednate Stopped by: Felix Pacini, DO   Prolensa 0.07 % Soln Generic drug: Bromfenac Sodium Stopped by: Felix Pacini, DO     TAKE these medications   amLODipine 2.5 MG  tablet Commonly known as: NORVASC TAKE 1 TABLET(2.5 MG) BY MOUTH DAILY   buPROPion 300 MG 24 hr tablet Commonly known as: WELLBUTRIN XL TAKE 1 TABLET(300 MG) BY MOUTH DAILY   cetirizine 10 MG tablet Commonly known as: ZYRTEC Take 1 tablet (10 mg total) by mouth at bedtime.   diclofenac 75 MG EC tablet Commonly known as: VOLTAREN Take 1 tablet (75 mg total) by mouth 2 (two) times daily. Started by: Felix Pacini, DO   fluticasone 50 MCG/ACT nasal spray Commonly known as: FLONASE Place 2 sprays into both nostrils daily.   rosuvastatin 10 MG tablet Commonly known as: CRESTOR TAKE 1 TABLET(10 MG) BY MOUTH AT BEDTIME   triamterene-hydrochlorothiazide 37.5-25 MG tablet Commonly known as: MAXZIDE-25 TAKE 1 TABLET BY MOUTH DAILY       All past medical history, surgical history, allergies, family history, immunizations andmedications were updated in the EMR today and reviewed under the history and medication portions of their EMR.     ROS: Negative, with the exception of above mentioned in HPI   Objective:  BP 121/77   Pulse 66   Temp 98.2 F (36.8 C) (Oral)   Ht 5\' 2"  (1.575 m)   Wt 144 lb (65.3 kg)   SpO2 98%   BMI 26.34 kg/m  Body mass index is 26.34 kg/m. Gen: Afebrile. No acute distress. Nontoxic in appearance, well developed, well nourished.  HENT: AT. [place].  Neuro: Normal gait. PERLA. EOMi. Alert. Oriented x3. Lumbar: No swelling or erythema. Ropiness bilateral lumbar paraspinal muscles.   Muscle strength 5/5 BLE. negative FABRE and negative SLR bilateral bilateral. NV intact distally.  Psych: Normal affect, dress and demeanor. Normal speech. Normal thought content and judgment.  No exam data present No results found. No results found for this or any previous visit (from the past 24 hour(s)).  Assessment/Plan: NAILANI FULL is a 69 y.o. female present for OV for  Lumbar pain Rest, heat.  voltaren BID with food.  Xray today since acutely worsening over last  2 weeks rule out compression fx or disc complication.  - diclofenac (VOLTAREN) 75 MG EC tablet; Take 1 tablet (75 mg total) by mouth 2 (two) times daily.  Dispense: 60 tablet; Refill: 5 - DG Lumbar Spine Complete; Future - consider PT if xray is without acute process.  F/u PRN   Reviewed expectations re: course of current medical issues.  Discussed self-management of symptoms.  Outlined signs and symptoms indicating need for more acute intervention.  Patient verbalized understanding and all questions were  answered.  Patient received an After-Visit Summary.    Orders Placed This Encounter  Procedures  . DG Lumbar Spine Complete   Meds ordered this encounter  Medications  . diclofenac (VOLTAREN) 75 MG EC tablet    Sig: Take 1 tablet (75 mg total) by mouth 2 (two) times daily.    Dispense:  60 tablet    Refill:  5   Referral Orders  No referral(s) requested today     Note is dictated utilizing voice recognition software. Although note has been proof read prior to signing, occasional typographical errors still can be missed. If any questions arise, please do not hesitate to call for verification.   electronically signed by:  Felix Pacini, DO  [facility] - [place]

## 2020-04-01 ENCOUNTER — Telehealth: Payer: Self-pay | Admitting: Family Medicine

## 2020-04-01 DIAGNOSIS — M545 Low back pain, unspecified: Secondary | ICD-10-CM

## 2020-04-01 DIAGNOSIS — M47816 Spondylosis without myelopathy or radiculopathy, lumbar region: Secondary | ICD-10-CM

## 2020-04-01 NOTE — Telephone Encounter (Signed)
Please inform patient her x-ray of her lumbar spine resulted with mild lower back arthritis. Continue the diclofenac twice daily with food to help with daily discomfort. I also placed the order for physical therapy near [facility] area for her. Follow-up after physical therapy if not seeing improvement with medication and physical therapy, sooner if worsening.

## 2020-04-01 NOTE — Telephone Encounter (Signed)
Pt aware of results and understands that PT will call to schedule

## 2020-04-13 ENCOUNTER — Encounter: Payer: Self-pay | Admitting: Physical Therapy

## 2020-04-13 ENCOUNTER — Other Ambulatory Visit: Payer: Self-pay

## 2020-04-13 ENCOUNTER — Ambulatory Visit: Payer: Medicare Other | Attending: Family Medicine | Admitting: Physical Therapy

## 2020-04-13 DIAGNOSIS — M6281 Muscle weakness (generalized): Secondary | ICD-10-CM | POA: Diagnosis present

## 2020-04-13 DIAGNOSIS — M62830 Muscle spasm of back: Secondary | ICD-10-CM | POA: Diagnosis present

## 2020-04-13 DIAGNOSIS — M545 Low back pain, unspecified: Secondary | ICD-10-CM | POA: Insufficient documentation

## 2020-04-13 DIAGNOSIS — R29898 Other symptoms and signs involving the musculoskeletal system: Secondary | ICD-10-CM | POA: Diagnosis present

## 2020-04-13 NOTE — Patient Instructions (Signed)
    Home exercise program created by Denise Rios, PT.  For questions, please contact Denise Rios via phone at [phone] or email at [email or web address]  [facility] [place] [address]  [address] [place], [place], [address] Phone: [phone]   Fax:  [phone]    

## 2020-04-13 NOTE — Therapy (Signed)
[facility] [address]  [address] [place], [place], [address] Phone: [phone]   Fax:  [phone]  Physical Therapy Evaluation  Patient Details  Name: Denise Rios MRN: [id number] Date of Birth: Jun 03, 1950 Referring Provider (PT): Felix Pacini, DO   Encounter Date: 04/13/2020   PT End of Session - 04/13/20 1340    Visit Number 1    Authorization Type Medicare & [employer]etna    PT Start Time 1340    PT Stop Time 1444    PT Time Calculation (min) 64 min    Activity Tolerance Patient tolerated treatment well    Behavior During Therapy [facility] for tasks assessed/performed           Past Medical History:  Diagnosis Date  . Anhedonia   . Anxiety   . Arteriosclerosis of carotid artery 03/19/2019   Less than 50%  . Chicken pox   . Hematuria 12/19/2017  . History of fainting spells of unknown cause   . Hyperlipidemia   . Hypertension   . Memory loss    prioor PCP records indicate neuro referral was made  . Menopause   . Osteoporosis   . Rotator cuff (capsule) sprain    R>L  . Syncope 09/2018  . Vitamin D deficiency     Past Surgical History:  Procedure Laterality Date  . CESAREAN SECTION  1980  . ELECTROCARDIOGRAM  10/24/2018   SR. HR74, PR 167, QTC 406, NL-EKG  . event monitor  01/10/2019   Predominant normal sinus rhythm.  HR 54-1 38.  Average HR 75.  No atrial fib.  Rare ectopic beats.  5 beat episode of nonsustained ventricular tachycardia x1.  No bradycardia arrhythmias.  No pauses.  Normal sinus rhythm without ectopy.  . Image: CT chest  10/25/2018   normal- r/o PE  . TRANSTHORACIC ECHOCARDIOGRAM  11/26/2018   EF 60-65%.  Normal study.  Completed presyncope.  . US CAROTID DOPPLER BILATERAL ([facility] HX)  10/24/2018   Arteriolosclerosis.  Findings consistent with less than 50% stenosis.  Bilateral vertebral blood flow demonstrated.    There were no vitals filed for this visit.    Subjective Assessment -  04/13/20 1344    Subjective Pt reports for the past "how many" years her back would feel tired - if she sat down for a few minutes, back would be relieved. For past month, unable to get this relief so saw MD and diagnosed with facet OA. Stared on Voltaren with good relief - pain much better.    Limitations Standing;House hold activities    How long can you stand comfortably? 5 minutes    Diagnostic tests Lumbar x-ray 03/30/20: Mild bilateral lower lumbar facet arthropathy. No lumbar spine fracture or spondylolisthesis.    Patient Stated Goals "preventative" (to keep pain from coming back)    Currently in Pain? No/denies    Pain Score 0-No pain   up to 10/10 prior to starting Voltaren   Pain Location Back    Pain Orientation Lower    Pain Descriptors / Indicators Throbbing    Pain Type Acute pain;Chronic pain    Pain Radiating Towards n/a    Pain Onset 1 to 4 weeks ago    Pain Frequency Intermittent    Aggravating Factors  standing activities (washing hair)    Pain Relieving Factors Voltaren; sitting (up until ~1 month ago)    Effect of Pain on Daily Activities limits tolerance for standing activites  [facility] - 04/13/20 1340      Assessment   Medical Diagnosis Lumbar pain 2 facet arthropathy    Referring Provider (PT) Felix Pacinienee Kuneff, DO    Onset Date/Surgical Date --   ~1 month   Hand Dominance Right    Next MD Visit March 2022    Prior Therapy none for current condition; remote h/o PT for shoulder issues      Precautions   Precautions None      Restrictions   Weight Bearing Restrictions No      Balance Screen   Has the patient fallen in the past 6 months No    Has the patient had a decrease in activity level because of a fear of falling?  No    Is the patient reluctant to leave their home because of a fear of falling?  No      Home Environment   Living Environment Private residence    Living Arrangements Spouse/significant other    Type of Home  House    Home Access Stairs to enter    Entrance Stairs-Number of Steps 1    Home Layout One level      Prior Function   Level of Independence Independent    Vocation Retired    Leisure no regular exercise, mostly sedentary      Cognition   Overall Cognitive Status Within Functional Limits for tasks assessed      Observation/Other Assessments   Focus on Therapeutic Outcomes (FOTO)  Lumbar - 74% (26% limitation); Predicted 79% (21% limitation)      ROM / Strength   AROM / PROM / Strength AROM;Strength      AROM   AROM Assessment Site Lumbar    Lumbar Flexion hands to mid shins    Lumbar Extension 25% limited    Lumbar - Right Side Bend hand to lateral jt line of knee    Lumbar - Left Side Bend hand to fibular head    Lumbar - Right Rotation 25% limited    Lumbar - Left Rotation 10% limited      Strength   Strength Assessment Site Hip;Knee;Ankle    Right/Left Hip Right;Left    Right Hip Flexion 4/5    Right Hip Extension 4-/5    Right Hip External Rotation  4-/5    Right Hip Internal Rotation 4-/5    Right Hip ABduction 4-/5    Right Hip ADduction 4/5    Left Hip Flexion 4-/5    Left Hip Extension 4-/5    Left Hip External Rotation 4-/5    Left Hip Internal Rotation 4-/5    Left Hip ABduction 4-/5    Left Hip ADduction 4/5    Right/Left Knee Right;Left    Right Knee Flexion 4/5    Right Knee Extension 4+/5    Left Knee Flexion 4/5    Left Knee Extension 4+/5    Right/Left Ankle Right;Left    Right Ankle Dorsiflexion 4-/5    Right Ankle Plantar Flexion 5/5    Left Ankle Dorsiflexion 4-/5    Left Ankle Plantar Flexion 5/5      Flexibility   Soft Tissue Assessment /Muscle Length yes    Hamstrings mod tightness L>R    Quadriceps mild tight quads & hip flexors B    ITB mild tight B    Piriformis very mild tight B      Palpation   Palpation comment Increased muscle tension B lumbar paraspinals but denies ttp  Objective  measurements completed on examination: See above findings.       [facility] Adult PT Treatment/Exercise - 04/13/20 1340      Exercises   Exercises Lumbar      Lumbar Exercises: Stretches   Passive Hamstring Stretch Right;Left;30 seconds;1 rep    Passive Hamstring Stretch Limitations supine with strap    Single Knee to Chest Stretch Right;Left;30 seconds;1 rep    Lower Trunk Rotation Limitations 5 x 5"    Hip Flexor Stretch Right;Left;30 seconds;1 rep    Hip Flexor Stretch Limitations mod thomas with strap    Piriformis Stretch Right;Left;30 seconds;2 reps    Piriformis Stretch Limitations supine KTOS + hip IR/ER    Figure 4 Stretch 30 seconds;1 rep;Supine;With overpressure    Figure 4 Stretch Limitations pt reporting better stretch with KTOS      Lumbar Exercises: Supine   Pelvic Tilt 5 reps;5 seconds                  PT Education - 04/13/20 1442    Person(s) Educated Patient    Methods Explanation;Demonstration;Verbal cues;Tactile cues;Handout    Comprehension Verbalized understanding;Verbal cues required;Tactile cues required;Returned demonstration;Need further instruction            PT Short Term Goals - 04/13/20 1340      PT SHORT TERM GOAL #1   Title Patient will be independent with initial HEP    Status New    Target Date 05/04/20      PT SHORT TERM GOAL #2   Title Patient will verbalize/demonstrate understanding of neutral spine posture and proper body mechanics to reduce strain on lumbar spine    Status New    Target Date 05/04/20             PT Long Term Goals - 04/13/20 1444      PT LONG TERM GOAL #1   Title Patient will be independent with ongoing/advanced HEP    Status New    Target Date 05/25/20      PT LONG TERM GOAL #2   Title Patient to demonstrate appropriate posture and body mechanics needed for daily activities    Status New    Target Date 05/25/20      PT LONG TERM GOAL #3   Title Patient to improve lumbar AROM to [facility] without  pain provocation    Status New    Target Date 05/25/20      PT LONG TERM GOAL #4   Title Patient will demonstrate improved B LE strength to >/= 4+/5 for improved stability and ease of mobility    Status New    Target Date 05/25/20      PT LONG TERM GOAL #5   Title Patient will improve standing tolerance to >/= 30 minutes w/o low back pain interference to allow resumption of normal daily activities    Status New    Target Date 05/25/20      PT LONG TERM GOAL #6   Title Patient to report ability to perform ADLs, household tasks and leisure activities without limitation due to low back pain or fatigue    Status New    Target Date 05/25/20                  Plan - 04/13/20 1444    Clinical Impression Statement Denise Rios is a 69 y/o female who presents to OP PT for acute LBP. She notes a long h/o "tiredness" in her back with prolonged standing  which she could previously relieve by sitting down but as of ~1 month ago she was no longer able to find relief until started on Voltaren by MD.  Deficits include mildly decreased lumbar ROM more due to tightness than pain, limited proximal LE flexibility, increased muscle tension in lumbar paraspinals, and proximal LE weakness. Denise Rios will benefit from skilled PT to address above deficits to reduce myofascial pain and tightness and improve flexibility and core strength to decrease LBP pain and allow for increased participation in desired activities with decreased pain interference.    Personal Factors and Comorbidities Comorbidity 3+;Fitness;Past/Current Experience;Time since onset of injury/illness/exacerbation    Comorbidities OA, osteoporosis, HTN, syncope, anxiety, h/p RTC strain, carotid stenosis    Examination-Activity Limitations Stand    Examination-Participation Restrictions Cleaning;Laundry;Meal Prep;Shop    Clinical Decision Making Low    Rehab Potential Excellent    PT Frequency 2x / week   hopefully tapering to 1x/wk   PT Duration 6 weeks      PT Treatment/Interventions ADLs/Self Care Home Management;Cryotherapy;Electrical Stimulation;Moist Heat;Traction;Ultrasound;Functional mobility training;Therapeutic activities;Therapeutic exercise;Neuromuscular re-education;Patient/family education;Manual techniques;Passive range of motion;Dry needling;Taping;Spinal Manipulations    PT Next Visit Plan Review initial HEP; posture and body mechanics education; lumbopelvic flexibility and strengthening; manual therapy & modalities PRN    PT Home Exercise Plan 11/15 - HS, hip flexor, piriformis/glute stretches, LTR, pelvic tilt    Consulted and Agree with Plan of Care Patient           Patient will benefit from skilled therapeutic intervention in order to improve the following deficits and impairments:  Decreased activity tolerance, Decreased knowledge of precautions, Decreased range of motion, Decreased safety awareness, Decreased strength, Increased fascial restricitons, Increased muscle spasms, Impaired flexibility, Improper body mechanics, Postural dysfunction, Pain  Visit Diagnosis: Acute bilateral low back pain without sciatica  Muscle weakness (generalized)  Muscle spasm of back  Other symptoms and signs involving the musculoskeletal system     Problem List Patient Active Problem List   Diagnosis Date Noted  . Osteoporosis 03/19/2019  . Benign paroxysmal positional vertigo 02/21/2019  . Hyperlipidemia   . Hypertension   . Overweight (BMI 25.0-29.9) 02/20/2019  . Syncope 09/2018    Marry Guan, PT, MPT 04/13/2020, 7:05 PM  [facility] [address]  [address] [place], [place], [address] Phone: [phone]   Fax:  [phone]  Name: Denise Rios MRN: [id number] Date of Birth: August 26, 1950

## 2020-04-15 ENCOUNTER — Other Ambulatory Visit: Payer: Self-pay

## 2020-04-15 ENCOUNTER — Ambulatory Visit: Payer: Medicare Other

## 2020-04-15 DIAGNOSIS — M545 Low back pain, unspecified: Secondary | ICD-10-CM | POA: Diagnosis not present

## 2020-04-15 DIAGNOSIS — R29898 Other symptoms and signs involving the musculoskeletal system: Secondary | ICD-10-CM

## 2020-04-15 DIAGNOSIS — M62830 Muscle spasm of back: Secondary | ICD-10-CM

## 2020-04-15 DIAGNOSIS — M6281 Muscle weakness (generalized): Secondary | ICD-10-CM

## 2020-04-15 NOTE — Therapy (Signed)
[facility] Rehabilitation [facility] [address]  [address] [place], [place], [address] Phone: [phone]   Fax:  [phone]  Physical Therapy Treatment  Patient Details  Name: Denise Rios MRN: [id number] Date of Birth: 03/26/1951 Referring Provider (PT): Felix Pacini, DO   Encounter Date: 04/15/2020   PT End of Session - 04/15/20 1347    Visit Number 2    Number of Visits 12    Date for PT Re-Evaluation 05/25/20    Authorization Type Medicare & Aetna    PT Start Time 1326   Pt. arrived late   PT Stop Time 1359    PT Time Calculation (min) 33 min    Activity Tolerance Patient tolerated treatment well    Behavior During Therapy [facility] for tasks assessed/performed           Past Medical History:  Diagnosis Date  . Anhedonia   . Anxiety   . Arteriosclerosis of carotid artery 03/19/2019   Less than 50%  . Chicken pox   . Hematuria 12/19/2017  . History of fainting spells of unknown cause   . Hyperlipidemia   . Hypertension   . Memory loss    prioor PCP records indicate neuro referral was made  . Menopause   . Osteoporosis   . Rotator cuff (capsule) sprain    R>L  . Syncope 09/2018  . Vitamin D deficiency     Past Surgical History:  Procedure Laterality Date  . CESAREAN SECTION  1980  . ELECTROCARDIOGRAM  10/24/2018   SR. HR74, PR 167, QTC 406, NL-EKG  . event monitor  01/10/2019   Predominant normal sinus rhythm.  HR 54-1 38.  Average HR 75.  No atrial fib.  Rare ectopic beats.  5 beat episode of nonsustained ventricular tachycardia x1.  No bradycardia arrhythmias.  No pauses.  Normal sinus rhythm without ectopy.  . Image: CT chest  10/25/2018   normal- r/o PE  . TRANSTHORACIC ECHOCARDIOGRAM  11/26/2018   EF 60-65%.  Normal study.  Completed presyncope.  . US CAROTID DOPPLER BILATERAL ([facility] HX)  10/24/2018   Arteriolosclerosis.  Findings consistent with less than 50% stenosis.  Bilateral vertebral blood flow demonstrated.     There were no vitals filed for this visit.   Subjective Assessment - 04/15/20 1330    Subjective Pt. doing ok.  Has most pain with prolonged standing.    Diagnostic tests Lumbar x-ray 03/30/20: Mild bilateral lower lumbar facet arthropathy. No lumbar spine fracture or spondylolisthesis.    Patient Stated Goals "preventative" (to keep pain from coming back)    Currently in Pain? No/denies    Pain Score 0-No pain   pain up to a 10/10   Pain Location Back    Pain Orientation Lower    Pain Descriptors / Indicators Throbbing    Pain Type Acute pain;Chronic pain    Pain Frequency Intermittent    Aggravating Factors  Prolonged standing activities                             [facility] Adult PT Treatment/Exercise - 04/15/20 0001      Lumbar Exercises: Stretches   Passive Hamstring Stretch Right;Left;30 seconds;1 rep   cues for appropriate pressure and ROM   Passive Hamstring Stretch Limitations supine with strap    Single Knee to Chest Stretch Right;Left;30 seconds;1 rep    Lower Trunk Rotation Limitations 10 x 5"  Hip Flexor Stretch Right;Left;30 seconds;1 rep   cues for LE support on floor   Hip Flexor Stretch Limitations mod thomas with strap    Piriformis Stretch Right;Left;30 seconds;2 reps   cues required for angle    Piriformis Stretch Limitations supine KTOS + hip IR/ER    Figure 4 Stretch 30 seconds;1 rep;Supine;With overpressure    Figure 4 Stretch Limitations pt reporting better stretch with KTOS      Lumbar Exercises: Aerobic   Nustep vl 4, 6 min (UE/LE)                    PT Short Term Goals - 04/15/20 1347      PT SHORT TERM GOAL #1   Title Patient will be independent with initial HEP    Status On-going    Target Date 05/04/20      PT SHORT TERM GOAL #2   Title Patient will verbalize/demonstrate understanding of neutral spine posture and proper body mechanics to reduce strain on lumbar spine    Status On-going    Target Date 05/04/20              PT Long Term Goals - 04/15/20 1349      PT LONG TERM GOAL #1   Title Patient will be independent with ongoing/advanced HEP    Status On-going      PT LONG TERM GOAL #2   Title Patient to demonstrate appropriate posture and body mechanics needed for daily activities    Status On-going      PT LONG TERM GOAL #3   Title Patient to improve lumbar AROM to [facility] without pain provocation    Status On-going      PT LONG TERM GOAL #4   Title Patient will demonstrate improved B LE strength to >/= 4+/5 for improved stability and ease of mobility    Status On-going      PT LONG TERM GOAL #5   Title Patient will improve standing tolerance to >/= 30 minutes w/o low back pain interference to allow resumption of normal daily activities    Status On-going      PT LONG TERM GOAL #6   Title Patient to report ability to perform ADLs, household tasks and leisure activities without limitation due to low back pain or fatigue    Status On-going                 Plan - 04/15/20 1352    Clinical Impression Statement Pt. arrived late to session thus tx time limited. reviewed HEP with majority of shortened treatment session as pt. required instruction for proper techinque with LE stretches.  Dennie Bible was able to verbalize/demo good understanding of HEP by end of session instrucion.    Comorbidities OA, osteoporosis, HTN, syncope, anxiety, h/p RTC strain, carotid stenosis    Rehab Potential Excellent    PT Frequency 2x / week   Hopefully tapering to 1x/week   PT Duration 6 weeks    PT Treatment/Interventions ADLs/Self Care Home Management;Cryotherapy;Electrical Stimulation;Moist Heat;Traction;Ultrasound;Functional mobility training;Therapeutic activities;Therapeutic exercise;Neuromuscular re-education;Patient/family education;Manual techniques;Passive range of motion;Dry needling;Taping;Spinal Manipulations    PT Next Visit Plan Posture and body mechanics education; lumbopelvic flexibility and  strengthening; manual therapy & modalities PRN    PT Home Exercise Plan 11/15 - HS, hip flexor, piriformis/glute stretches, LTR, pelvic tilt    Consulted and Agree with Plan of Care Patient           Patient will benefit from skilled therapeutic  intervention in order to improve the following deficits and impairments:  Decreased activity tolerance, Decreased knowledge of precautions, Decreased range of motion, Decreased safety awareness, Decreased strength, Increased fascial restricitons, Increased muscle spasms, Impaired flexibility, Improper body mechanics, Postural dysfunction, Pain  Visit Diagnosis: Acute bilateral low back pain without sciatica  Muscle weakness (generalized)  Muscle spasm of back  Other symptoms and signs involving the musculoskeletal system     Problem List Patient Active Problem List   Diagnosis Date Noted  . Osteoporosis 03/19/2019  . Benign paroxysmal positional vertigo 02/21/2019  . Hyperlipidemia   . Hypertension   . Overweight (BMI 25.0-29.9) 02/20/2019  . Syncope 09/2018    Kermit Balo, PTA 04/15/20 3:13 PM   [facility] Outpatient Rehabilitation [facility] [address]  [address] [place], [place], [address] Phone: [phone]   Fax:  [phone]  Name: EMMAJEAN RATLEDGE MRN: [id number] Date of Birth: 1950-07-25

## 2020-04-29 ENCOUNTER — Ambulatory Visit: Payer: Medicare Other | Attending: Family Medicine

## 2020-04-29 ENCOUNTER — Other Ambulatory Visit: Payer: Self-pay

## 2020-04-29 DIAGNOSIS — R29898 Other symptoms and signs involving the musculoskeletal system: Secondary | ICD-10-CM | POA: Insufficient documentation

## 2020-04-29 DIAGNOSIS — M62830 Muscle spasm of back: Secondary | ICD-10-CM | POA: Diagnosis present

## 2020-04-29 DIAGNOSIS — M545 Low back pain, unspecified: Secondary | ICD-10-CM | POA: Diagnosis not present

## 2020-04-29 DIAGNOSIS — M6281 Muscle weakness (generalized): Secondary | ICD-10-CM | POA: Insufficient documentation

## 2020-04-29 NOTE — Therapy (Signed)
[facility] Outpatient Rehabilitation [facility] [address]  [address] [place], [place], [address] Phone: [phone]   Fax:  [phone]  Physical Therapy Treatment  Patient Details  Name: Denise Rios MRN: [id number] Date of Birth: April 28, 1951 Referring Provider (PT): Felix Pacini, DO   Encounter Date: 04/29/2020   PT End of Session - 04/29/20 1320    Visit Number 3    Number of Visits 12    Date for PT Re-Evaluation 05/25/20    Authorization Type Medicare & Aetna    PT Start Time 1316    PT Stop Time 1403    PT Time Calculation (min) 47 min    Activity Tolerance Patient tolerated treatment well    Behavior During Therapy [facility] for tasks assessed/performed           Past Medical History:  Diagnosis Date  . Anhedonia   . Anxiety   . Arteriosclerosis of carotid artery 03/19/2019   Less than 50%  . Chicken pox   . Hematuria 12/19/2017  . History of fainting spells of unknown cause   . Hyperlipidemia   . Hypertension   . Memory loss    prioor PCP records indicate neuro referral was made  . Menopause   . Osteoporosis   . Rotator cuff (capsule) sprain    R>L  . Syncope 09/2018  . Vitamin D deficiency     Past Surgical History:  Procedure Laterality Date  . CESAREAN SECTION  1980  . ELECTROCARDIOGRAM  10/24/2018   SR. HR74, PR 167, QTC 406, NL-EKG  . event monitor  01/10/2019   Predominant normal sinus rhythm.  HR 54-1 38.  Average HR 75.  No atrial fib.  Rare ectopic beats.  5 beat episode of nonsustained ventricular tachycardia x1.  No bradycardia arrhythmias.  No pauses.  Normal sinus rhythm without ectopy.  . Image: CT chest  10/25/2018   normal- r/o PE  . TRANSTHORACIC ECHOCARDIOGRAM  11/26/2018   EF 60-65%.  Normal study.  Completed presyncope.  . US CAROTID DOPPLER BILATERAL ([facility] HX)  10/24/2018   Arteriolosclerosis.  Findings consistent with less than 50% stenosis.  Bilateral vertebral blood flow demonstrated.    There were no  vitals filed for this visit.   Subjective Assessment - 04/29/20 1318    Subjective Pt. noting she has performed her HEP the last three days however has not been getting them done before this.    Diagnostic tests Lumbar x-ray 03/30/20: Mild bilateral lower lumbar facet arthropathy. No lumbar spine fracture or spondylolisthesis.    Patient Stated Goals "preventative" (to keep pain from coming back)    Currently in Pain? No/denies    Pain Score 1    10/10 at worst;  pain up to a 3-4/10 average   Pain Location Back    Pain Orientation Lower    Pain Descriptors / Indicators Throbbing    Pain Type Acute pain;Chronic pain    Aggravating Factors  prolonged standing (after no time at all)    Multiple Pain Sites No                             [facility] Adult PT Treatment/Exercise - 04/29/20 0001      Self-Care   Self-Care Other Self-Care Comments;Posture    Posture Instruction in proper desk posture to reduce cervical strain as pt. sits for prolonged times at a desktop computer     Other Self-Care  Comments  instruction in proper posture and body mechanics with daily activities to reduce lumbar strain       Lumbar Exercises: Stretches   Passive Hamstring Stretch Right;Left;1 rep;30 seconds    Passive Hamstring Stretch Limitations supine with strap    Lower Trunk Rotation Limitations 10 x 5"    Figure 4 Stretch 30 seconds;1 rep;Supine;With overpressure    Figure 4 Stretch Limitations strap assisted - pt. noting better stretch with strap thus updated HEP      Lumbar Exercises: Aerobic   Nustep vl 4, 6 min (UE/LE)      Lumbar Exercises: Standing   Functional Squats 10 reps;3 seconds    Functional Squats Limitations counter       Lumbar Exercises: Seated   Sit to Stand 10 reps    Sit to Stand Limitations sit to stand from mat table without hands       Lumbar Exercises: Supine   Bridge 15 reps    Bridge Limitations half ROM                   PT Education -  04/29/20 1550    Education Details HEP update; counter squat, strap assisted piri stretch    Person(s) Educated Patient    Methods Explanation;Demonstration;Verbal cues;Handout    Comprehension Verbalized understanding;Returned demonstration;Verbal cues required            PT Short Term Goals - 04/29/20 1348      PT SHORT TERM GOAL #1   Title Patient will be independent with initial HEP    Status Achieved    Target Date 05/04/20      PT SHORT TERM GOAL #2   Title Patient will verbalize/demonstrate understanding of neutral spine posture and proper body mechanics to reduce strain on lumbar spine    Status On-going    Target Date 05/04/20             PT Long Term Goals - 04/15/20 1349      PT LONG TERM GOAL #1   Title Patient will be independent with ongoing/advanced HEP    Status On-going      PT LONG TERM GOAL #2   Title Patient to demonstrate appropriate posture and body mechanics needed for daily activities    Status On-going      PT LONG TERM GOAL #3   Title Patient to improve lumbar AROM to [facility] without pain provocation    Status On-going      PT LONG TERM GOAL #4   Title Patient will demonstrate improved B LE strength to >/= 4+/5 for improved stability and ease of mobility    Status On-going      PT LONG TERM GOAL #5   Title Patient will improve standing tolerance to >/= 30 minutes w/o low back pain interference to allow resumption of normal daily activities    Status On-going      PT LONG TERM GOAL #6   Title Patient to report ability to perform ADLs, household tasks and leisure activities without limitation due to low back pain or fatigue    Status On-going                 Plan - 04/29/20 1335    Clinical Impression Statement Pt. noting no issues with HEP.  STG #1.  Reviewed of HEP did reveal that pt. able to report a greater glute stretch laying supine with addition of strap assistance (pt. uses her husbands belt).  HEP  updated with strap-assisted  glute/piri stretch.  Progressed LE strengthening along with counter squat which was tolerated well.  Ended visit with pt. noting no increased pain thus modalities deferred.    Comorbidities OA, osteoporosis, HTN, syncope, anxiety, h/p RTC strain, carotid stenosis    Rehab Potential Excellent    PT Frequency 2x / week   hopefully tapering to 1x/week   PT Duration 6 weeks    PT Treatment/Interventions ADLs/Self Care Home Management;Cryotherapy;Electrical Stimulation;Moist Heat;Traction;Ultrasound;Functional mobility training;Therapeutic activities;Therapeutic exercise;Neuromuscular re-education;Patient/family education;Manual techniques;Passive range of motion;Dry needling;Taping;Spinal Manipulations    PT Next Visit Plan Lumbopelvic flexibility and strengthening; manual therapy & modalities PRN    PT Home Exercise Plan 11/15 - HS, hip flexor, piriformis/glute stretches, LTR, pelvic tilt; 12/01 - strap assisted piri stretch, counter squat    Consulted and Agree with Plan of Care Patient           Patient will benefit from skilled therapeutic intervention in order to improve the following deficits and impairments:  Decreased activity tolerance, Decreased knowledge of precautions, Decreased range of motion, Decreased safety awareness, Decreased strength, Increased fascial restricitons, Increased muscle spasms, Impaired flexibility, Improper body mechanics, Postural dysfunction, Pain  Visit Diagnosis: Acute bilateral low back pain without sciatica  Muscle weakness (generalized)  Muscle spasm of back  Other symptoms and signs involving the musculoskeletal system     Problem List Patient Active Problem List   Diagnosis Date Noted  . Osteoporosis 03/19/2019  . Benign paroxysmal positional vertigo 02/21/2019  . Hyperlipidemia   . Hypertension   . Overweight (BMI 25.0-29.9) 02/20/2019  . Syncope 09/2018    Kermit Balo, PTA 04/29/20 3:55 PM   [facility] [address]  [address] [place], [place], [address] Phone: [phone]   Fax:  [phone]  Name: Denise Rios MRN: [id number] Date of Birth: 06/06/1950

## 2020-04-29 NOTE — Patient Instructions (Addendum)

## 2020-05-05 ENCOUNTER — Ambulatory Visit: Payer: Medicare Other | Admitting: Physical Therapy

## 2020-05-05 ENCOUNTER — Encounter: Payer: Self-pay | Admitting: Physical Therapy

## 2020-05-05 ENCOUNTER — Other Ambulatory Visit: Payer: Self-pay | Admitting: Family Medicine

## 2020-05-05 ENCOUNTER — Other Ambulatory Visit: Payer: Self-pay

## 2020-05-05 DIAGNOSIS — R29898 Other symptoms and signs involving the musculoskeletal system: Secondary | ICD-10-CM

## 2020-05-05 DIAGNOSIS — M62830 Muscle spasm of back: Secondary | ICD-10-CM

## 2020-05-05 DIAGNOSIS — M545 Low back pain, unspecified: Secondary | ICD-10-CM | POA: Diagnosis not present

## 2020-05-05 DIAGNOSIS — M6281 Muscle weakness (generalized): Secondary | ICD-10-CM

## 2020-05-05 NOTE — Patient Instructions (Signed)
    Home exercise program created by Dathan Attia, PT.  For questions, please contact Oakley Kossman via phone at [phone] or email at [email or web address]  [facility] [place] [address]  [address] [place], [place], [address] Phone: [phone]   Fax:  [phone]    

## 2020-05-05 NOTE — Therapy (Signed)
[facility] [place] [address]  [place], [place], [address] Phone: [phone]   Fax:  [phone]  Physical Therapy Treatment  Patient Details  Name: Denise Rios MRN: [id number] Date of Birth: 09-Sep-1950 Referring Provider (PT): Howard Pouch, DO   Encounter Date: 05/05/2020   PT End of Session - 05/05/20 1305    Visit Number 4    Number of Visits 12    Date for PT Re-Evaluation 05/25/20    Authorization Type Medicare & Aetna    PT Start Time 1305    PT Stop Time 1356    PT Time Calculation (min) 51 min    Activity Tolerance Patient tolerated treatment well    Behavior During Therapy [facility] for tasks assessed/performed           Past Medical History:  Diagnosis Date  . Anhedonia   . Anxiety   . Arteriosclerosis of carotid artery 03/19/2019   Less than 50%  . Chicken pox   . Hematuria 12/19/2017  . History of fainting spells of unknown cause   . Hyperlipidemia   . Hypertension   . Memory loss    prioor PCP records indicate neuro referral was made  . Menopause   . Osteoporosis   . Rotator cuff (capsule) sprain    R>L  . Syncope 09/2018  . Vitamin D deficiency     Past Surgical History:  Procedure Laterality Date  . CESAREAN SECTION  1980  . ELECTROCARDIOGRAM  10/24/2018   SR. HR74, PR 167, QTC 406, NL-EKG  . event monitor  01/10/2019   Predominant normal sinus rhythm.  HR 54-1 38.  Average HR 75.  No atrial fib.  Rare ectopic beats.  5 beat episode of nonsustained ventricular tachycardia x1.  No bradycardia arrhythmias.  No pauses.  Normal sinus rhythm without ectopy.  . Image: CT chest  10/25/2018   normal- r/o PE  . TRANSTHORACIC ECHOCARDIOGRAM  11/26/2018   EF 60-65%.  Normal study.  Completed presyncope.  . US CAROTID DOPPLER BILATERAL ([facility] HX)  10/24/2018   Arteriolosclerosis.  Findings consistent with less than 50% stenosis.  Bilateral vertebral blood flow demonstrated.    There were no  vitals filed for this visit.   Subjective Assessment - 05/05/20 1308    Subjective Pt reports pain earlier when she was working in the [place], but no pain currently. She states she took some Rx pain meds and used a back brace that she has purchased OTC which she will use for up to a few hours when the pain get bad. She notes that it doesn't take long for the pain to kick in and this make her really angry.    Diagnostic tests Lumbar x-ray 03/30/20: Mild bilateral lower lumbar facet arthropathy. No lumbar spine fracture or spondylolisthesis.    Patient Stated Goals "preventative" (to keep pain from coming back)    Currently in Pain? No/denies    Pain Score 0-No pain   up to 8/10 while working in the kitchen earlier   Pain Location Back    Pain Orientation Lower                             [facility] Adult PT Treatment/Exercise - 05/05/20 1305      Exercises   Exercises Lumbar      Lumbar Exercises: Stretches   Prone on Elbows Stretch 30 seconds;1 rep    Press  Ups 5 reps;5 seconds    Quadruped Mid Back Stretch 30 seconds;3 reps    Quadruped Mid Back Stretch Limitations 3-way prayer stretch    standing version provided for HEP   Piriformis Stretch Right;Left;30 seconds;2 reps    Piriformis Stretch Limitations supine KTOS + hip ER      Lumbar Exercises: Aerobic   Nustep L5 x 6 min (UE/LE)      Lumbar Exercises: Standing   Functional Squats 10 reps;3 seconds    Functional Squats Limitations cues for posterior wt shift, avoiding knees past toes - pt still reporting knee discomfort      Lumbar Exercises: Supine   Ab Set 10 reps;5 seconds    Pelvic Tilt 10 reps;5 seconds    Clam 10 reps;3 seconds    Clam Limitations alt red TB hip ABD/ER    Bent Knee Raise 10 reps;3 seconds    Bent Knee Raise Limitations red TB brace march    Dead Bug 10 reps;3 seconds    Dead Bug Limitations LE only      Lumbar Exercises: Quadruped   Madcat/Old Horse 10 reps    Straight Leg Raise 10  reps;3 seconds                  PT Education - 05/05/20 1357    Education Details HEP update - red TB brace marching, cat/camel, POE/press up, standing prayer stretch    Person(s) Educated Patient    Methods Explanation;Demonstration;Verbal cues;Handout    Comprehension Verbalized understanding;Verbal cues required;Returned demonstration;Need further instruction            PT Short Term Goals - 05/05/20 1322      PT SHORT TERM GOAL #1   Title Patient will be independent with initial HEP    Status Achieved   04/29/20     PT SHORT TERM GOAL #2   Title Patient will verbalize/demonstrate understanding of neutral spine posture and proper body mechanics to reduce strain on lumbar spine    Status Achieved   05/05/20            PT Long Term Goals - 04/15/20 1349      PT LONG TERM GOAL #1   Title Patient will be independent with ongoing/advanced HEP    Status On-going      PT LONG TERM GOAL #2   Title Patient to demonstrate appropriate posture and body mechanics needed for daily activities    Status On-going      PT LONG TERM GOAL #3   Title Patient to improve lumbar AROM to [facility] without pain provocation    Status On-going      PT LONG TERM GOAL #4   Title Patient will demonstrate improved B LE strength to >/= 4+/5 for improved stability and ease of mobility    Status On-going      PT LONG TERM GOAL #5   Title Patient will improve standing tolerance to >/= 30 minutes w/o low back pain interference to allow resumption of normal daily activities    Status On-going      PT LONG TERM GOAL #6   Title Patient to report ability to perform ADLs, household tasks and leisure activities without limitation due to low back pain or fatigue    Status On-going                 Plan - 05/05/20 1356    Clinical Impression Statement Denise Rios reports good understanding of posture and body mechanics training  and denies any questions/concerns - STG #2 met. She denies pain currently  but continues to note limited tolerance for standing activities to the point where pain sometimes causes her to rely on OTC back brace to be able to complete activities in standing. She reports HEP stretches are going well other than clarification necessary for KTOS piriformis stretch as she was not able to achieve a good stretch. She also notes increased knee pain with counter squat added last visit - review of technique requiring cues to avoid knees forward of toes with better comfort noted but still not pain free at R knee - pt instructed to defer exercise if increased pain persists. Remainder of session focusing on lumbopelvic flexibility and strengthening to improve standing tolerance with HEP updated based on exercises where pt noting most benefit. Session completed with pt denying any pain.    Comorbidities OA, osteoporosis, HTN, syncope, anxiety, h/p RTC strain, carotid stenosis    Rehab Potential Excellent    PT Frequency 2x / week   hopefully tapering to 1x/week   PT Duration 6 weeks    PT Treatment/Interventions ADLs/Self Care Home Management;Cryotherapy;Electrical Stimulation;Moist Heat;Traction;Ultrasound;Functional mobility training;Therapeutic activities;Therapeutic exercise;Neuromuscular re-education;Patient/family education;Manual techniques;Passive range of motion;Dry needling;Taping;Spinal Manipulations    PT Next Visit Plan Lumbopelvic flexibility and strengthening; manual therapy & modalities PRN    PT Home Exercise Plan 11/15 - HS, hip flexor, piriformis/glute stretches, LTR, pelvic tilt; 12/1 - strap assisted piri stretch, counter squat; 12/7 - red TB brace marching, cat/camel, POE/press up, standing prayer stretch    Consulted and Agree with Plan of Care Patient           Patient will benefit from skilled therapeutic intervention in order to improve the following deficits and impairments:  Decreased activity tolerance, Decreased knowledge of precautions, Decreased range of  motion, Decreased safety awareness, Decreased strength, Increased fascial restricitons, Increased muscle spasms, Impaired flexibility, Improper body mechanics, Postural dysfunction, Pain  Visit Diagnosis: Acute bilateral low back pain without sciatica  Muscle weakness (generalized)  Muscle spasm of back  Other symptoms and signs involving the musculoskeletal system     Problem List Patient Active Problem List   Diagnosis Date Noted  . Osteoporosis 03/19/2019  . Benign paroxysmal positional vertigo 02/21/2019  . Hyperlipidemia   . Hypertension   . Overweight (BMI 25.0-29.9) 02/20/2019  . Syncope 09/2018    Percival Spanish, PT, MPT 05/05/2020, 6:35 PM  [facility] [address]  [place], [place], [address] Phone: [phone]   Fax:  [phone]  Name: Denise Rios MRN: [id number] Date of Birth: 1950-07-23

## 2020-05-08 ENCOUNTER — Encounter: Payer: Self-pay | Admitting: Physical Therapy

## 2020-05-08 ENCOUNTER — Ambulatory Visit: Payer: Medicare Other | Admitting: Physical Therapy

## 2020-05-08 ENCOUNTER — Other Ambulatory Visit: Payer: Self-pay

## 2020-05-08 DIAGNOSIS — M545 Low back pain, unspecified: Secondary | ICD-10-CM | POA: Diagnosis not present

## 2020-05-08 DIAGNOSIS — M6281 Muscle weakness (generalized): Secondary | ICD-10-CM

## 2020-05-08 DIAGNOSIS — M62830 Muscle spasm of back: Secondary | ICD-10-CM

## 2020-05-08 DIAGNOSIS — R29898 Other symptoms and signs involving the musculoskeletal system: Secondary | ICD-10-CM

## 2020-05-08 NOTE — Therapy (Signed)
[facility] [place] [address]  [place], [place], [address] Phone: [phone]   Fax:  [phone]  Physical Therapy Treatment  Patient Details  Name: Denise Rios MRN: [id number] Date of Birth: 1951-03-11 Referring Provider (PT): Howard Pouch, DO   Encounter Date: 05/08/2020   PT End of Session - 05/08/20 1101    Visit Number 5    Number of Visits 12    Date for PT Re-Evaluation 05/25/20    Authorization Type Medicare & Aetna    PT Start Time 1101    PT Stop Time 1148    PT Time Calculation (min) 47 min    Activity Tolerance Patient tolerated treatment well    Behavior During Therapy [facility] for tasks assessed/performed           Past Medical History:  Diagnosis Date  . Anhedonia   . Anxiety   . Arteriosclerosis of carotid artery 03/19/2019   Less than 50%  . Chicken pox   . Hematuria 12/19/2017  . History of fainting spells of unknown cause   . Hyperlipidemia   . Hypertension   . Memory loss    prioor PCP records indicate neuro referral was made  . Menopause   . Osteoporosis   . Rotator cuff (capsule) sprain    R>L  . Syncope 09/2018  . Vitamin D deficiency     Past Surgical History:  Procedure Laterality Date  . CESAREAN SECTION  1980  . ELECTROCARDIOGRAM  10/24/2018   SR. HR74, PR 167, QTC 406, NL-EKG  . event monitor  01/10/2019   Predominant normal sinus rhythm.  HR 54-1 38.  Average HR 75.  No atrial fib.  Rare ectopic beats.  5 beat episode of nonsustained ventricular tachycardia x1.  No bradycardia arrhythmias.  No pauses.  Normal sinus rhythm without ectopy.  . Image: CT chest  10/25/2018   normal- r/o PE  . TRANSTHORACIC ECHOCARDIOGRAM  11/26/2018   EF 60-65%.  Normal study.  Completed presyncope.  . US CAROTID DOPPLER BILATERAL ([place] HX)  10/24/2018   Arteriolosclerosis.  Findings consistent with less than 50% stenosis.  Bilateral vertebral blood flow demonstrated.    There were no  vitals filed for this visit.   Subjective Assessment - 05/08/20 1106    Subjective Pt still noting intermittent pain with daily activities but denies pain with exercises.    Diagnostic tests Lumbar x-ray 03/30/20: Mild bilateral lower lumbar facet arthropathy. No lumbar spine fracture or spondylolisthesis.    Patient Stated Goals "preventative" (to keep pain from coming back)    Currently in Pain? No/denies                             [facility] Adult PT Treatment/Exercise - 05/08/20 1102      Exercises   Exercises Lumbar      Lumbar Exercises: Stretches   Piriformis Stretch Left;30 seconds;1 rep    Piriformis Stretch Limitations seated KTOS    Figure 4 Stretch 30 seconds;1 rep;With overpressure;Seated    Figure 4 Stretch Limitations seated figure 4 hip hinge      Lumbar Exercises: Aerobic   Nustep L5 x 6 min (UE/LE)      Lumbar Exercises: Supine   Clam 10 reps;3 seconds    Clam Limitations alt red TB hip ABD/ER    Bent Knee Raise 10 reps;3 seconds    Bent Knee Raise Limitations red TB brace  march - cues for abdominal activation and to stop at 90/90 position    Dead Bug 10 reps;3 seconds   2 sets   Dead Bug Limitations 1st set - LE only; 2nd set - UE/LE    Bridge 10 reps;5 seconds    Bridge Limitations + red TB hip ABD isometric      Lumbar Exercises: Sidelying   Other Sidelying Lumbar Exercises R/L open book stretch 10 x 5"      Lumbar Exercises: Quadruped   Straight Leg Raise 10 reps;3 seconds    Opposite Arm/Leg Raise Right arm/Left leg;Left arm/Right leg;5 reps;3 seconds    Opposite Arm/Leg Raise Limitations more unsteady with difficulty maintaining level torso even with ball added under torso    Other Quadruped Lumbar Exercises R/L fire hydrants 10 x 3"                    PT Short Term Goals - 05/05/20 1322      PT SHORT TERM GOAL #1   Title Patient will be independent with initial HEP    Status Achieved   04/29/20     PT SHORT TERM GOAL #2    Title Patient will verbalize/demonstrate understanding of neutral spine posture and proper body mechanics to reduce strain on lumbar spine    Status Achieved   05/05/20            PT Long Term Goals - 05/08/20 1108      PT LONG TERM GOAL #1   Title Patient will be independent with ongoing/advanced HEP    Status Partially Met    Target Date 05/25/20      PT LONG TERM GOAL #2   Title Patient to demonstrate appropriate posture and body mechanics needed for daily activities    Status Partially Met    Target Date 05/25/20      PT LONG TERM GOAL #3   Title Patient to improve lumbar AROM to [facility] without pain provocation    Status On-going    Target Date 05/25/20      PT LONG TERM GOAL #4   Title Patient will demonstrate improved B LE strength to >/= 4+/5 for improved stability and ease of mobility    Status On-going    Target Date 05/25/20      PT LONG TERM GOAL #5   Title Patient will improve standing tolerance to >/= 30 minutes w/o low back pain interference to allow resumption of normal daily activities    Status On-going    Target Date 05/25/20      PT LONG TERM GOAL #6   Title Patient to report ability to perform ADLs, household tasks and leisure activities without limitation due to low back pain or fatigue    Status On-going    Target Date 05/25/20                 Plan - 05/08/20 1148    Clinical Impression Statement Fraser Din denies pain with exercises but still having intermittent pain with daily activities. She states some stretches no longer creating much sensation of the stretch, therefore provided instruction in alternative versions to help intensify stretches. She reports good understanding of posture and body mechanics but does not note much benefit beyond the hint to place foot on bottom edge of cabinet opening with standing activities at the counter. Continued lumbopelvic strengthening progression with pt continuing to have some difficulty coordinating combined  UE/LE movements but no pain reported with exercises.  Comorbidities OA, osteoporosis, HTN, syncope, anxiety, h/p RTC strain, carotid stenosis    Rehab Potential Excellent    PT Frequency 2x / week   hopefully tapering to 1x/week   PT Duration 6 weeks    PT Treatment/Interventions ADLs/Self Care Home Management;Cryotherapy;Electrical Stimulation;Moist Heat;Traction;Ultrasound;Functional mobility training;Therapeutic activities;Therapeutic exercise;Neuromuscular re-education;Patient/family education;Manual techniques;Passive range of motion;Dry needling;Taping;Spinal Manipulations    PT Next Visit Plan Lumbopelvic flexibility and strengthening; manual therapy & modalities PRN    PT Home Exercise Plan 11/15 - HS, hip flexor, piriformis/glute stretches, LTR, pelvic tilt; 12/1 - strap assisted piri stretch, counter squat; 12/7 - red TB brace marching, cat/camel, POE/press up, standing prayer stretch    Consulted and Agree with Plan of Care Patient           Patient will benefit from skilled therapeutic intervention in order to improve the following deficits and impairments:  Decreased activity tolerance,Decreased knowledge of precautions,Decreased range of motion,Decreased safety awareness,Decreased strength,Increased fascial restricitons,Increased muscle spasms,Impaired flexibility,Improper body mechanics,Postural dysfunction,Pain  Visit Diagnosis: Acute bilateral low back pain without sciatica  Muscle weakness (generalized)  Muscle spasm of back  Other symptoms and signs involving the musculoskeletal system     Problem List Patient Active Problem List   Diagnosis Date Noted  . Osteoporosis 03/19/2019  . Benign paroxysmal positional vertigo 02/21/2019  . Hyperlipidemia   . Hypertension   . Overweight (BMI 25.0-29.9) 02/20/2019  . Syncope 09/2018    Percival Spanish, PT, MPT 05/08/2020, 12:13 PM  [facility] [address]   [place], [place], [address] Phone: [phone]   Fax:  [phone]  Name: JESLYNN HOLLANDER MRN: [id number] Date of Birth: 1951-03-10

## 2020-05-12 ENCOUNTER — Other Ambulatory Visit: Payer: Self-pay

## 2020-05-12 ENCOUNTER — Encounter: Payer: Self-pay | Admitting: Physical Therapy

## 2020-05-12 ENCOUNTER — Ambulatory Visit: Payer: Medicare Other | Admitting: Physical Therapy

## 2020-05-12 DIAGNOSIS — M6281 Muscle weakness (generalized): Secondary | ICD-10-CM

## 2020-05-12 DIAGNOSIS — M545 Low back pain, unspecified: Secondary | ICD-10-CM

## 2020-05-12 DIAGNOSIS — R29898 Other symptoms and signs involving the musculoskeletal system: Secondary | ICD-10-CM

## 2020-05-12 DIAGNOSIS — M62830 Muscle spasm of back: Secondary | ICD-10-CM

## 2020-05-12 NOTE — Patient Instructions (Signed)
    Home exercise program created by Syeda Prickett, PT.  For questions, please contact Arryana Tolleson via phone at [phone] or email at [email or web address]  [facility] [address]  [address] [place], [place], [address] Phone: [phone]   Fax:  [phone]    

## 2020-05-12 NOTE — Therapy (Signed)
[facility] [address]  [place], [place], [address] Phone: [phone]   Fax:  [phone]  Physical Therapy Treatment  Patient Details  Name: Denise Rios MRN: [id number] Date of Birth: November 18, 1950 Referring Provider (PT): Howard Pouch, DO   Encounter Date: 05/12/2020   PT End of Session - 05/12/20 1315    Visit Number 6    Number of Visits 12    Date for PT Re-Evaluation 05/25/20    Authorization Type Medicare & Aetna    PT Start Time 1315    PT Stop Time 1400    PT Time Calculation (min) 45 min    Activity Tolerance Patient tolerated treatment well    Behavior During Therapy [facility] for tasks assessed/performed           Past Medical History:  Diagnosis Date  . Anhedonia   . Anxiety   . Arteriosclerosis of carotid artery 03/19/2019   Less than 50%  . Chicken pox   . Hematuria 12/19/2017  . History of fainting spells of unknown cause   . Hyperlipidemia   . Hypertension   . Memory loss    prioor PCP records indicate neuro referral was made  . Menopause   . Osteoporosis   . Rotator cuff (capsule) sprain    R>L  . Syncope 09/2018  . Vitamin D deficiency     Past Surgical History:  Procedure Laterality Date  . CESAREAN SECTION  1980  . ELECTROCARDIOGRAM  10/24/2018   SR. HR74, PR 167, QTC 406, NL-EKG  . event monitor  01/10/2019   Predominant normal sinus rhythm.  HR 54-1 38.  Average HR 75.  No atrial fib.  Rare ectopic beats.  5 beat episode of nonsustained ventricular tachycardia x1.  No bradycardia arrhythmias.  No pauses.  Normal sinus rhythm without ectopy.  . Image: CT chest  10/25/2018   normal- r/o PE  . TRANSTHORACIC ECHOCARDIOGRAM  11/26/2018   EF 60-65%.  Normal study.  Completed presyncope.  . US CAROTID DOPPLER BILATERAL ([place] HX)  10/24/2018   Arteriolosclerosis.  Findings consistent with less than 50% stenosis.  Bilateral vertebral blood flow demonstrated.    There were no  vitals filed for this visit.   Subjective Assessment - 05/12/20 1318    Subjective Pt reports she is "hopeful that things are moving in the right direction" - able to do more housework w/o triggering her pain.    Diagnostic tests Lumbar x-ray 03/30/20: Mild bilateral lower lumbar facet arthropathy. No lumbar spine fracture or spondylolisthesis.    Patient Stated Goals "preventative" (to keep pain from coming back)    Currently in Pain? No/denies                             [facility] Adult PT Treatment/Exercise - 05/12/20 1315      Exercises   Exercises Lumbar      Lumbar Exercises: Aerobic   Nustep L5 x 6 min (UE/LE)      Lumbar Exercises: Standing   Functional Squats 10 reps;3 seconds    Functional Squats Limitations TRX    Row Both;10 reps;Strengthening;Theraband    Theraband Level (Row) Level 2 (Red)    Row Limitations cues for abdominal bracing & scap retraction    Shoulder Extension Both;10 reps;Strengthening;Theraband    Theraband Level (Shoulder Extension) Level 2 (Red)    Shoulder Extension Limitations cues for abdominal bracing & scap  retraction    Other Standing Lumbar Exercises R/L red TB pallof press & short arc trunk rotation x 10 each      Lumbar Exercises: Quadruped   Opposite Arm/Leg Raise Right arm/Left leg;Left arm/Right leg;10 reps;3 seconds    Opposite Arm/Leg Raise Limitations torso supported on peanut ball    Plank 2 x 20 sec hands to toes; 1 x 5 sec elbows to toes                  PT Education - 05/12/20 1400    Education Details HEP update - red TB rows, retraction/extension & short arc rotation    Person(s) Educated Patient    Methods Explanation;Demonstration;Verbal cues;Handout;Tactile cues    Comprehension Verbalized understanding;Verbal cues required;Tactile cues required;Returned demonstration;Need further instruction            PT Short Term Goals - 05/05/20 1322      PT SHORT TERM GOAL #1   Title Patient will be  independent with initial HEP    Status Achieved   04/29/20     PT SHORT TERM GOAL #2   Title Patient will verbalize/demonstrate understanding of neutral spine posture and proper body mechanics to reduce strain on lumbar spine    Status Achieved   05/05/20            PT Long Term Goals - 05/08/20 1108      PT LONG TERM GOAL #1   Title Patient will be independent with ongoing/advanced HEP    Status Partially Met    Target Date 05/25/20      PT LONG TERM GOAL #2   Title Patient to demonstrate appropriate posture and body mechanics needed for daily activities    Status Partially Met    Target Date 05/25/20      PT LONG TERM GOAL #3   Title Patient to improve lumbar AROM to [facility] without pain provocation    Status On-going    Target Date 05/25/20      PT LONG TERM GOAL #4   Title Patient will demonstrate improved B LE strength to >/= 4+/5 for improved stability and ease of mobility    Status On-going    Target Date 05/25/20      PT LONG TERM GOAL #5   Title Patient will improve standing tolerance to >/= 30 minutes w/o low back pain interference to allow resumption of normal daily activities    Status On-going    Target Date 05/25/20      PT LONG TERM GOAL #6   Title Patient to report ability to perform ADLs, household tasks and leisure activities without limitation due to low back pain or fatigue    Status On-going    Target Date 05/25/20                 Plan - 05/12/20 1321    Clinical Impression Statement Fraser Din reports improving tolerance for household chores with no pain noted recently and she is hopeful that things are improving. Progress strengthening to include upright postural strengthening targeting abdominal and scapular stabilizers along with continued progression on mat exercises with good tolerance and improving stability noted at exercise reps progressed. HEP updated accordingly.    Comorbidities OA, osteoporosis, HTN, syncope, anxiety, h/p RTC strain, carotid  stenosis    Rehab Potential Excellent    PT Frequency 2x / week   hopefully tapering to 1x/week   PT Duration 6 weeks    PT Treatment/Interventions ADLs/Self Care Home  Management;Cryotherapy;Electrical Stimulation;Moist Heat;Traction;Ultrasound;Functional mobility training;Therapeutic activities;Therapeutic exercise;Neuromuscular re-education;Patient/family education;Manual techniques;Passive range of motion;Dry needling;Taping;Spinal Manipulations    PT Next Visit Plan Lumbopelvic flexibility and strengthening; manual therapy & modalities PRN    PT Home Exercise Plan 11/15 - HS, hip flexor, piriformis/glute stretches, LTR, pelvic tilt; 12/1 - strap assisted piri stretch, counter squat; 12/7 - red TB brace marching, cat/camel, POE/press up, standing prayer stretch    Consulted and Agree with Plan of Care Patient           Patient will benefit from skilled therapeutic intervention in order to improve the following deficits and impairments:  Decreased activity tolerance,Decreased knowledge of precautions,Decreased range of motion,Decreased safety awareness,Decreased strength,Increased fascial restricitons,Increased muscle spasms,Impaired flexibility,Improper body mechanics,Postural dysfunction,Pain  Visit Diagnosis: Acute bilateral low back pain without sciatica  Muscle weakness (generalized)  Muscle spasm of back  Other symptoms and signs involving the musculoskeletal system     Problem List Patient Active Problem List   Diagnosis Date Noted  . Osteoporosis 03/19/2019  . Benign paroxysmal positional vertigo 02/21/2019  . Hyperlipidemia   . Hypertension   . Overweight (BMI 25.0-29.9) 02/20/2019  . Syncope 09/2018    Percival Spanish, PT, MPT 05/12/2020, 2:17 PM  [facility] [address]  [place], [place], [address] Phone: [phone]   Fax:  [phone]  Name: CAMREIGH MICHIE MRN: [id number] Date of Birth:  1951/02/28

## 2020-05-15 ENCOUNTER — Other Ambulatory Visit: Payer: Self-pay

## 2020-05-15 ENCOUNTER — Ambulatory Visit: Payer: Medicare Other | Admitting: Physical Therapy

## 2020-05-15 ENCOUNTER — Encounter: Payer: Self-pay | Admitting: Physical Therapy

## 2020-05-15 DIAGNOSIS — M6281 Muscle weakness (generalized): Secondary | ICD-10-CM

## 2020-05-15 DIAGNOSIS — R29898 Other symptoms and signs involving the musculoskeletal system: Secondary | ICD-10-CM

## 2020-05-15 DIAGNOSIS — M62830 Muscle spasm of back: Secondary | ICD-10-CM

## 2020-05-15 DIAGNOSIS — M545 Low back pain, unspecified: Secondary | ICD-10-CM | POA: Diagnosis not present

## 2020-05-15 NOTE — Patient Instructions (Signed)
    Home exercise program created by [name], PT.  For questions, please contact [name] via phone at [phone] or email at [name]  [facility] [place] [address]  [address] [place], [place], [address] Phone: [phone]   Fax:  [phone]    

## 2020-05-15 NOTE — Therapy (Signed)
[facility] [place] [address]  [place], [place], [address] Phone: [phone]   Fax:  [phone]  Physical Therapy Treatment  Patient Details  Name: Denise Rios MRN: [id number] Date of Birth: 1950/06/04 Referring Provider (PT): Howard Pouch, DO   Encounter Date: 05/15/2020   PT End of Session - 05/15/20 1108    Visit Number 7    Number of Visits 12    Date for PT Re-Evaluation 05/25/20    Authorization Type Medicare & Aetna    PT Start Time 1105    PT Stop Time 1147    PT Time Calculation (min) 42 min    Activity Tolerance Patient tolerated treatment well    Behavior During Therapy [facility] for tasks assessed/performed           Past Medical History:  Diagnosis Date  . Anhedonia   . Anxiety   . Arteriosclerosis of carotid artery 03/19/2019   Less than 50%  . Chicken pox   . Hematuria 12/19/2017  . History of fainting spells of unknown cause   . Hyperlipidemia   . Hypertension   . Memory loss    prioor PCP records indicate neuro referral was made  . Menopause   . Osteoporosis   . Rotator cuff (capsule) sprain    R>L  . Syncope 09/2018  . Vitamin D deficiency     Past Surgical History:  Procedure Laterality Date  . CESAREAN SECTION  1980  . ELECTROCARDIOGRAM  10/24/2018   SR. HR74, PR 167, QTC 406, NL-EKG  . event monitor  01/10/2019   Predominant normal sinus rhythm.  HR 54-1 38.  Average HR 75.  No atrial fib.  Rare ectopic beats.  5 beat episode of nonsustained ventricular tachycardia x1.  No bradycardia arrhythmias.  No pauses.  Normal sinus rhythm without ectopy.  . Image: CT chest  10/25/2018   normal- r/o PE  . TRANSTHORACIC ECHOCARDIOGRAM  11/26/2018   EF 60-65%.  Normal study.  Completed presyncope.  . US CAROTID DOPPLER BILATERAL ([place] HX)  10/24/2018   Arteriolosclerosis.  Findings consistent with less than 50% stenosis.  Bilateral vertebral blood flow demonstrated.    There were no  vitals filed for this visit.   Subjective Assessment - 05/15/20 1107    Subjective Pt noting increased post-exercise muscle soreness following last visit which is now resolved. She notes improving activity tolerance but sanding to wash her hair is still limited.    How long can you stand comfortably? 10-15 minutes    Diagnostic tests Lumbar x-ray 03/30/20: Mild bilateral lower lumbar facet arthropathy. No lumbar spine fracture or spondylolisthesis.    Patient Stated Goals "preventative" (to keep pain from coming back)    Currently in Pain? No/denies              [facility] PT Assessment - 05/15/20 1105      Assessment   Medical Diagnosis Lumbar pain 2 facet arthropathy    Referring Provider (PT) Howard Pouch, DO    Next MD Visit March 2022      AROM   Lumbar Flexion hands to ankles    Lumbar Extension WFL    Lumbar - Right Side Bend hand to fibular head    Lumbar - Left Side Bend hand to fibular head    Lumbar - Right Rotation 10% limited    Lumbar - Left Rotation [facility]      Strength   Right Hip Flexion 4+/5  Right Hip Extension 4/5    Right Hip External Rotation  4/5    Right Hip Internal Rotation 4+/5    Right Hip ABduction 4+/5    Right Hip ADduction 4+/5    Left Hip Flexion 4/5    Left Hip Extension 4/5    Left Hip External Rotation 4/5    Left Hip Internal Rotation 4+/5    Left Hip ABduction 4+/5    Left Hip ADduction 4+/5    Right Knee Flexion 5/5    Right Knee Extension 5/5    Left Knee Flexion 5/5    Left Knee Extension 5/5    Right Ankle Dorsiflexion 5/5    Right Ankle Plantar Flexion 5/5    Left Ankle Dorsiflexion 4+/5    Left Ankle Plantar Flexion 5/5                         [facility] Adult PT Treatment/Exercise - 05/15/20 1105      Lumbar Exercises: Aerobic   Nustep L5 x 6 min (UE/LE)      Lumbar Exercises: Supine   Bridge with clamshell 10 reps;5 seconds    Bridge with Ball Squeeze Limitations red TB      Lumbar Exercises: Sidelying    Clam Right;Left;10 reps;3 seconds    Clam Limitations red TB                  PT Education - 05/15/20 1145    Education Details HEP update - red TB clam and bridge clam    Person(s) Educated Patient    Methods Explanation;Demonstration;Verbal cues;Handout    Comprehension Verbalized understanding;Verbal cues required;Returned demonstration;Need further instruction            PT Short Term Goals - 05/05/20 1322      PT SHORT TERM GOAL #1   Title Patient will be independent with initial HEP    Status Achieved   04/29/20     PT SHORT TERM GOAL #2   Title Patient will verbalize/demonstrate understanding of neutral spine posture and proper body mechanics to reduce strain on lumbar spine    Status Achieved   05/05/20            PT Long Term Goals - 05/15/20 1115      PT LONG TERM GOAL #1   Title Patient will be independent with ongoing/advanced HEP    Status Partially Met    Target Date 05/25/20      PT LONG TERM GOAL #2   Title Patient to demonstrate appropriate posture and body mechanics needed for daily activities    Status Partially Met    Target Date 05/25/20      PT LONG TERM GOAL #3   Title Patient to improve lumbar AROM to WFL without pain provocation    Status Achieved   05/15/20     PT LONG TERM GOAL #4   Title Patient will demonstrate improved B LE strength to >/= 4+/5 for improved stability and ease of mobility    Status Partially Met   05/15/20   Target Date 05/25/20      PT LONG TERM GOAL #5   Title Patient will improve standing tolerance to >/= 30 minutes w/o low back pain interference to allow resumption of normal daily activities    Status Partially Met   05/15/20 - standing tolerance improved to 10-15 minutes   Target Date 05/25/20      PT LONG TERM GOAL #  6   Title Patient to report ability to perform ADLs, household tasks and leisure activities without limitation due to low back pain or fatigue    Status Partially Met   05/15/20 - still  notes limited tolerance for standing to wash her hair   Target Date 05/25/20                 Plan - 05/15/20 1147    Clinical Impression Statement Pat reports decreasing pain and increasing activity tolerance but still notes limitation with standing tasks such as standing to wash her hair. Lumbar ROM is now essentially WFL other than mild tightness, but no pain reported. B LE strength is improving but she is still demonstrating mild L>R proximal LE weakness most notably with B hip extension and ER as well as L hip flexion. HEP updated to ensure exercises targeting weakness identified on MMT. She is progressing well toward LTGs with all goals at least partially met and ROM goal met. Discussed progress with pt and anticipate pt should be ready to transition to her HEP +/- 30-day hold after a final HEP review and update next week - pt in agreement.    Comorbidities OA, osteoporosis, HTN, syncope, anxiety, h/p RTC strain, carotid stenosis    Rehab Potential Excellent    PT Frequency 2x / week   hopefully tapering to 1x/week   PT Duration 6 weeks    PT Treatment/Interventions ADLs/Self Care Home Management;Cryotherapy;Electrical Stimulation;Moist Heat;Traction;Ultrasound;Functional mobility training;Therapeutic activities;Therapeutic exercise;Neuromuscular re-education;Patient/family education;Manual techniques;Passive range of motion;Dry needling;Taping;Spinal Manipulations    PT Next Visit Plan HEP review/update in prep for transition to HEP on last scheduled visit; Lumbar FOTO on final visit; lumbopelvic flexibility and strengthening; manual therapy & modalities PRN    PT Home Exercise Plan 11/15 - HS, hip flexor, piriformis/glute stretches, LTR, pelvic tilt; 12/1 - strap assisted piri stretch, counter squat; 12/7 - red TB brace marching, cat/camel, POE/press up, standing prayer stretch    Consulted and Agree with Plan of Care Patient           Patient will benefit from skilled therapeutic  intervention in order to improve the following deficits and impairments:  Decreased activity tolerance,Decreased knowledge of precautions,Decreased range of motion,Decreased safety awareness,Decreased strength,Increased fascial restricitons,Increased muscle spasms,Impaired flexibility,Improper body mechanics,Postural dysfunction,Pain  Visit Diagnosis: Acute bilateral low back pain without sciatica  Muscle weakness (generalized)  Muscle spasm of back  Other symptoms and signs involving the musculoskeletal system     Problem List Patient Active Problem List   Diagnosis Date Noted  . Osteoporosis 03/19/2019  . Benign paroxysmal positional vertigo 02/21/2019  . Hyperlipidemia   . Hypertension   . Overweight (BMI 25.0-29.9) 02/20/2019  . Syncope 09/2018    JoAnne M Kreis, PT, MPT 05/15/2020, 12:37 PM  [facility] [place] [address]  [address] [place], [place], [address] Phone: [phone]   Fax:  [phone]  Name: Gyselle A Defina MRN: [id number] Date of Birth: 12/04/1950   

## 2020-05-18 ENCOUNTER — Ambulatory Visit: Payer: Medicare Other

## 2020-05-20 ENCOUNTER — Other Ambulatory Visit: Payer: Self-pay

## 2020-05-20 ENCOUNTER — Ambulatory Visit: Payer: Medicare Other

## 2020-05-20 DIAGNOSIS — M545 Low back pain, unspecified: Secondary | ICD-10-CM | POA: Diagnosis not present

## 2020-05-20 DIAGNOSIS — R29898 Other symptoms and signs involving the musculoskeletal system: Secondary | ICD-10-CM

## 2020-05-20 DIAGNOSIS — M62830 Muscle spasm of back: Secondary | ICD-10-CM

## 2020-05-20 DIAGNOSIS — M6281 Muscle weakness (generalized): Secondary | ICD-10-CM

## 2020-05-20 NOTE — Therapy (Addendum)
[facility] [place] [address]  [place], [place], [address] Phone: [phone]   Fax:  [phone]  Physical Therapy Treatment / Discharge Summary  Patient Details  Name: Denise Rios MRN: [id number] Date of Birth: 01-02-1951 Referring Provider (PT): Howard Pouch, DO   Encounter Date: 05/20/2020   PT End of Session - 05/20/20 1107    Visit Number 8    Number of Visits 12    Date for PT Re-Evaluation 05/25/20    Authorization Type Medicare & Aetna    PT Start Time 1102    PT Stop Time 1150    PT Time Calculation (min) 48 min    Activity Tolerance Patient tolerated treatment well    Behavior During Therapy [facility] for tasks assessed/performed           Past Medical History:  Diagnosis Date  . Anhedonia   . Anxiety   . Arteriosclerosis of carotid artery 03/19/2019   Less than 50%  . Chicken pox   . Hematuria 12/19/2017  . History of fainting spells of unknown cause   . Hyperlipidemia   . Hypertension   . Memory loss    prioor PCP records indicate neuro referral was made  . Menopause   . Osteoporosis   . Rotator cuff (capsule) sprain    R>L  . Syncope 09/2018  . Vitamin D deficiency     Past Surgical History:  Procedure Laterality Date  . CESAREAN SECTION  1980  . ELECTROCARDIOGRAM  10/24/2018   SR. HR74, PR 167, QTC 406, NL-EKG  . event monitor  01/10/2019   Predominant normal sinus rhythm.  HR 54-1 38.  Average HR 75.  No atrial fib.  Rare ectopic beats.  5 beat episode of nonsustained ventricular tachycardia x1.  No bradycardia arrhythmias.  No pauses.  Normal sinus rhythm without ectopy.  . Image: CT chest  10/25/2018   normal- r/o PE  . TRANSTHORACIC ECHOCARDIOGRAM  11/26/2018   EF 60-65%.  Normal study.  Completed presyncope.  . US CAROTID DOPPLER BILATERAL ([place] HX)  10/24/2018   Arteriolosclerosis.  Findings consistent with less than 50% stenosis.  Bilateral vertebral blood flow demonstrated.     There were no vitals filed for this visit.   Subjective Assessment - 05/20/20 1106    Subjective Pt. reporting she wishes to finish up with therapy today and god on a 30-day hold.    Diagnostic tests Lumbar x-ray 03/30/20: Mild bilateral lower lumbar facet arthropathy. No lumbar spine fracture or spondylolisthesis.    Patient Stated Goals "preventative" (to keep pain from coming back)    Currently in Pain? No/denies    Pain Score 0-No pain    Pain Location Back    Pain Orientation Lower    Pain Type Acute pain;Chronic pain    Multiple Pain Sites No              [facility] PT Assessment - 05/20/20 0001      Observation/Other Assessments   Focus on Therapeutic Outcomes (FOTO)  FOTO: 88% (12% limitation); Predicted 79% (21% limitation)      AROM   AROM Assessment Site Lumbar    Lumbar Flexion hands to ankles    Lumbar Extension WFL    Lumbar - Right Side Bend hand to fibular head    Lumbar - Left Side Bend hand to fibular head    Lumbar - Right Rotation 10% limited    Lumbar - Left Rotation [facility]  Strength   Strength Assessment Site Knee;Hip;Ankle    Right/Left Hip Right;Left    Right Hip Flexion 4+/5    Right Hip Extension 4/5    Right Hip External Rotation  4/5    Right Hip Internal Rotation 4+/5    Right Hip ABduction 4+/5    Right Hip ADduction 4+/5    Left Hip Flexion 4/5    Left Hip Extension 4/5    Left Hip External Rotation 4/5    Left Hip Internal Rotation 4+/5    Left Hip ABduction 4+/5    Left Hip ADduction 4+/5    Right/Left Knee Right;Left    Right Knee Flexion 5/5    Right Knee Extension 5/5    Left Knee Flexion 5/5    Left Knee Extension 5/5    Right Ankle Dorsiflexion 5/5    Right Ankle Plantar Flexion 5/5    Left Ankle Dorsiflexion 4+/5    Left Ankle Plantar Flexion 5/5                         [facility] Adult PT Treatment/Exercise - 05/20/20 0001      Self-Care   Self-Care Other Self-Care Comments    Other Self-Care Comments   review of comprehensive HEP with notes made on handouts to focus patient toward activities to target remaining strength deficit      Lumbar Exercises: Stretches   Lower Trunk Rotation Limitations 10 x 5"      Lumbar Exercises: Aerobic   Nustep L5 x 6 min (UE/LE)      Lumbar Exercises: Supine   Clam 10 reps;3 seconds    Clam Limitations alt red TB hip ABD/ER    Bridge 10 reps;5 seconds    Bridge Limitations + red TB hip ABD isometric                    PT Short Term Goals - 05/05/20 1322      PT SHORT TERM GOAL #1   Title Patient will be independent with initial HEP    Status Achieved   04/29/20     PT SHORT TERM GOAL #2   Title Patient will verbalize/demonstrate understanding of neutral spine posture and proper body mechanics to reduce strain on lumbar spine    Status Achieved   05/05/20            PT Long Term Goals - 05/20/20 1112      PT LONG TERM GOAL #1   Title Patient will be independent with ongoing/advanced HEP    Status Achieved   05/20/20     PT LONG TERM GOAL #2   Title Patient to demonstrate appropriate posture and body mechanics needed for daily activities    Status Achieved   05/20/20     PT LONG TERM GOAL #3   Title Patient to improve lumbar AROM to [facility] without pain provocation    Status Achieved   05/15/20     PT LONG TERM GOAL #4   Title Patient will demonstrate improved B LE strength to >/= 4+/5 for improved stability and ease of mobility    Status Partially Met   05/20/20     PT LONG TERM GOAL #5   Title Patient will improve standing tolerance to >/= 30 minutes w/o low back pain interference to allow resumption of normal daily activities    Status Partially Met   05/20/20 - standing tolerance improved to 10-15 minutes  PT LONG TERM GOAL #6   Title Patient to report ability to perform ADLs, household tasks and leisure activities without limitation due to low back pain or fatigue    Status Partially Met   05/20/20 - still notes  limited tolerance for standing to wash her hair                Plan - 05/20/20 1108    Clinical Impression Statement Pt. wanting to go on 30-day hold from therapy.  Supervising PT approving 30-day hold.  Pt. has met LTG #1 as she verbalizes understanding of ongoing HEP.  Pt. has achieved LTG #2 as she demonstrates and verbalizes good understanding of proper posture and body mechanics with daily activities.  Pt. has previously met LTG #3 for improved lumbar AROM on 05/15/20.  LTG #4 partially met as pt. able to demo 4+/5-5/5 B LE strength with MMT with exception of B hip ER, ext., flexion still at 4/5 strength.  HEP updates previously issued to pt. reviewed today to explain to pt. which activities to focus on to improve remaining strength deficits.  Pt. partially met LTG #5 noting tolerance for 10-15 min walking/standing before being limited by LBP.  Pt. still noting she has limited tolerance for prolonged standing and washing her hair however able to perform all other ADLs without limitation from pain.  LTG #6 partially achieved.  Pt. now on 30-day hold from therapy.    Comorbidities OA, osteoporosis, HTN, syncope, anxiety, h/p RTC strain, carotid stenosis    Rehab Potential Excellent    PT Treatment/Interventions ADLs/Self Care Home Management;Cryotherapy;Electrical Stimulation;Moist Heat;Traction;Ultrasound;Functional mobility training;Therapeutic activities;Therapeutic exercise;Neuromuscular re-education;Patient/family education;Manual techniques;Passive range of motion;Dry needling;Taping;Spinal Manipulations    PT Next Visit Plan 30-day hold    PT Home Exercise Plan 11/15 - HS, hip flexor, piriformis/glute stretches, LTR, pelvic tilt; 12/1 - strap assisted piri stretch, counter squat; 12/7 - red TB brace marching, cat/camel, POE/press up, standing prayer stretch    Consulted and Agree with Plan of Care Patient           Patient will benefit from skilled therapeutic intervention in order  to improve the following deficits and impairments:  Decreased activity tolerance,Decreased knowledge of precautions,Decreased range of motion,Decreased safety awareness,Decreased strength,Increased fascial restricitons,Increased muscle spasms,Impaired flexibility,Improper body mechanics,Postural dysfunction,Pain  Visit Diagnosis: Acute bilateral low back pain without sciatica  Muscle weakness (generalized)  Muscle spasm of back  Other symptoms and signs involving the musculoskeletal system     Problem List Patient Active Problem List   Diagnosis Date Noted  . Osteoporosis 03/19/2019  . Benign paroxysmal positional vertigo 02/21/2019  . Hyperlipidemia   . Hypertension   . Overweight (BMI 25.0-29.9) 02/20/2019  . Syncope 09/2018    Bess Harvest, PTA 05/20/20 12:00 PM   [facility] [address]  [place], [place], [address] Phone: [phone]   Fax:  [phone]  Name: Denise Rios MRN: [id number] Date of Birth: Sep 28, 1950  PHYSICAL THERAPY DISCHARGE SUMMARY  Visits from Start of Care: 8  Current functional level related to goals / functional outcomes:   Refer to above clinical impression for status as of last visit on 05/20/2020. Patient was placed on hold for 30 days and has not needed to return to PT, therefore will proceed with discharge from PT for this episode.   Remaining deficits:   As above.   Education / Equipment:   HEP, Training and development officer education  Plan: Patient agrees  to discharge.  Patient goals were partially met. Patient is being discharged due to being pleased with the current functional level.  ?????     Percival Spanish, PT, MPT 06/26/20, 10:35 AM  [facility] [address]  [place], [place], [address] Phone: [phone]   Fax:  [phone]

## 2020-07-31 ENCOUNTER — Encounter: Payer: Self-pay | Admitting: Family Medicine

## 2020-07-31 ENCOUNTER — Telehealth: Payer: Self-pay | Admitting: Family Medicine

## 2020-07-31 ENCOUNTER — Other Ambulatory Visit: Payer: Self-pay | Admitting: Family Medicine

## 2020-07-31 ENCOUNTER — Other Ambulatory Visit: Payer: Self-pay

## 2020-07-31 MED ORDER — TRIAMTERENE-HCTZ 37.5-25 MG PO TABS
1.0000 | ORAL_TABLET | Freq: Every day | ORAL | 0 refills | Status: DC
Start: 2020-07-31 — End: 2020-08-12

## 2020-07-31 NOTE — Telephone Encounter (Signed)
Refill sent for 30 day supply. Patient was made aware

## 2020-07-31 NOTE — Telephone Encounter (Signed)
Patient requesting refill of triamterene/HCTZ. Scheduled office visit for 08/12/20.

## 2020-08-10 ENCOUNTER — Telehealth: Payer: Self-pay | Admitting: Family Medicine

## 2020-08-10 NOTE — Telephone Encounter (Signed)
Left message for patient to schedule Annual Wellness Visit.  Please schedule with Nurse Health Advisor Martha Stanley, RN at [facility]  °

## 2020-08-11 ENCOUNTER — Other Ambulatory Visit: Payer: Self-pay

## 2020-08-12 ENCOUNTER — Encounter: Payer: Self-pay | Admitting: Family Medicine

## 2020-08-12 ENCOUNTER — Ambulatory Visit (INDEPENDENT_AMBULATORY_CARE_PROVIDER_SITE_OTHER): Payer: Medicare Other | Admitting: Family Medicine

## 2020-08-12 VITALS — BP 112/74 | HR 74 | Temp 98.5°F | Ht 62.0 in | Wt 151.0 lb

## 2020-08-12 DIAGNOSIS — E663 Overweight: Secondary | ICD-10-CM | POA: Diagnosis not present

## 2020-08-12 DIAGNOSIS — E782 Mixed hyperlipidemia: Secondary | ICD-10-CM

## 2020-08-12 DIAGNOSIS — I10 Essential (primary) hypertension: Secondary | ICD-10-CM

## 2020-08-12 DIAGNOSIS — M542 Cervicalgia: Secondary | ICD-10-CM | POA: Insufficient documentation

## 2020-08-12 DIAGNOSIS — M545 Low back pain, unspecified: Secondary | ICD-10-CM | POA: Diagnosis not present

## 2020-08-12 LAB — COMPREHENSIVE METABOLIC PANEL
ALT: 21 U/L (ref 0–35)
AST: 21 U/L (ref 0–37)
Albumin: 4.4 g/dL (ref 3.5–5.2)
Alkaline Phosphatase: 59 U/L (ref 39–117)
BUN: 15 mg/dL (ref 6–23)
CO2: 28 mEq/L (ref 19–32)
Calcium: 9.6 mg/dL (ref 8.4–10.5)
Chloride: 99 mEq/L (ref 96–112)
Creatinine, Ser: 0.82 mg/dL (ref 0.40–1.20)
GFR: 73.76 mL/min (ref >60.00)
Glucose, Bld: 83 mg/dL (ref 70–99)
Potassium: 4.1 mEq/L (ref 3.5–5.1)
Sodium: 136 mEq/L (ref 135–145)
Total Bilirubin: 0.4 mg/dL (ref 0.2–1.2)
Total Protein: 7 g/dL (ref 6.0–8.3)

## 2020-08-12 LAB — CBC
HCT: 39.2 % (ref 36.0–46.0)
Hemoglobin: 13.3 g/dL (ref 12.0–15.0)
MCHC: 34 g/dL (ref 30.0–36.0)
MCV: 87 fl (ref 78.0–100.0)
Platelets: 287 10*3/uL (ref 150.0–400.0)
RBC: 4.5 Mil/uL (ref 3.87–5.11)
RDW: 13.2 % (ref 11.5–15.5)
WBC: 4.1 10*3/uL (ref 4.0–10.5)

## 2020-08-12 LAB — LIPID PANEL
Cholesterol: 249 mg/dL — ABNORMAL HIGH (ref 0–200)
HDL: 96.3 mg/dL (ref >39.00)
LDL Cholesterol: 143 mg/dL — ABNORMAL HIGH (ref 0–99)
NonHDL: 153.12
Total CHOL/HDL Ratio: 3
Triglycerides: 50 mg/dL (ref 0.0–149.0)
VLDL: 10 mg/dL (ref 0.0–40.0)

## 2020-08-12 LAB — TSH: TSH: 1.29 u[IU]/mL (ref 0.35–4.50)

## 2020-08-12 MED ORDER — AMLODIPINE BESYLATE 2.5 MG PO TABS: ORAL_TABLET | ORAL | 1 refills | Status: DC

## 2020-08-12 MED ORDER — BUPROPION HCL ER (XL) 300 MG PO TB24: ORAL_TABLET | ORAL | 1 refills | Status: DC

## 2020-08-12 MED ORDER — CETIRIZINE HCL 10 MG PO TABS: 10.0000 mg | ORAL_TABLET | Freq: Every day | ORAL | 11 refills | Status: DC

## 2020-08-12 MED ORDER — DICLOFENAC SODIUM 75 MG PO TBEC: 75.0000 mg | DELAYED_RELEASE_TABLET | Freq: Two times a day (BID) | ORAL | 5 refills | Status: DC

## 2020-08-12 MED ORDER — FLUTICASONE PROPIONATE 50 MCG/ACT NA SUSP: 2.0000 | Freq: Every day | NASAL | 6 refills | Status: DC

## 2020-08-12 MED ORDER — TRIAMTERENE-HCTZ 37.5-25 MG PO TABS: 1.0000 | ORAL_TABLET | Freq: Every day | ORAL | 1 refills | Status: DC

## 2020-08-12 MED ORDER — ROSUVASTATIN CALCIUM 10 MG PO TABS
ORAL_TABLET | ORAL | 1 refills | Status: DC
Start: 2020-08-12 — End: 2020-08-13

## 2020-08-12 NOTE — Patient Instructions (Addendum)
Great to see you today.  I have refilled your meds.  We will call you with lab results.  Bp looks good.   Next appt in 5.5 months for your chronic conditions.    Cervical Strain and Sprain Rehab Ask your health care provider which exercises are safe for you. Do exercises exactly as told by your health care provider and adjust them as directed. It is normal to feel mild stretching, pulling, tightness, or discomfort as you do these exercises. Stop right away if you feel sudden pain or your pain gets worse. Do not begin these exercises until told by your health care provider. Stretching and range-of-motion exercises Cervical side bending 1. Using good posture, sit on a stable chair or stand up. 2. Without moving your shoulders, slowly tilt your left / right ear to your shoulder until you feel a stretch in the opposite side neck muscles. You should be looking straight ahead. 3. Hold for __________ seconds. 4. Repeat with the other side of your neck. Repeat __________ times. Complete this exercise __________ times a day.   Cervical rotation 1. Using good posture, sit on a stable chair or stand up. 2. Slowly turn your head to the side as if you are looking over your left / right shoulder. ? Keep your eyes level with the ground. ? Stop when you feel a stretch along the side and the back of your neck. 3. Hold for __________ seconds. 4. Repeat this by turning to your other side. Repeat __________ times. Complete this exercise __________ times a day.   Thoracic extension and pectoral stretch 1. Roll a towel or a small blanket so it is about 4 inches (10 cm) in diameter. 2. Lie down on your back on a firm surface. 3. Put the towel lengthwise, under your spine in the middle of your back. It should not be under your shoulder blades. The towel should line up with your spine from your middle back to your lower back. 4. Put your hands behind your head and let your elbows fall out to your sides. 5. Hold  for __________ seconds. Repeat __________ times. Complete this exercise __________ times a day. Strengthening exercises Isometric upper cervical flexion 1. Lie on your back with a thin pillow behind your head and a small rolled-up towel under your neck. 2. Gently tuck your chin toward your chest and nod your head down to look toward your feet. Do not lift your head off the pillow. 3. Hold for __________ seconds. 4. Release the tension slowly. Relax your neck muscles completely before you repeat this exercise. Repeat __________ times. Complete this exercise __________ times a day. Isometric cervical extension 1. Stand about 6 inches (15 cm) away from a wall, with your back facing the wall. 2. Place a soft object, about 6-8 inches (15-20 cm) in diameter, between the back of your head and the wall. A soft object could be a small pillow, a ball, or a folded towel. 3. Gently tilt your head back and press into the soft object. Keep your jaw and forehead relaxed. 4. Hold for __________ seconds. 5. Release the tension slowly. Relax your neck muscles completely before you repeat this exercise. Repeat __________ times. Complete this exercise __________ times a day.   Posture and body mechanics Body mechanics refers to the movements and positions of your body while you do your daily activities. Posture is part of body mechanics. Good posture and healthy body mechanics can help to relieve stress in your body's  tissues and joints. Good posture means that your spine is in its natural S-curve position (your spine is neutral), your shoulders are pulled back slightly, and your head is not tipped forward. The following are general guidelines for applying improved posture and body mechanics to your everyday activities. Sitting 1. When sitting, keep your spine neutral and keep your feet flat on the floor. Use a footrest, if necessary, and keep your thighs parallel to the floor. Avoid rounding your shoulders, and  avoid tilting your head forward. 2. When working at a desk or a computer, keep your desk at a height where your hands are slightly lower than your elbows. Slide your chair under your desk so you are close enough to maintain good posture. 3. When working at a computer, place your monitor at a height where you are looking straight ahead and you do not have to tilt your head forward or downward to look at the screen.   Standing  When standing, keep your spine neutral and keep your feet about hip-width apart. Keep a slight bend in your knees. Your ears, shoulders, and hips should line up.  When you do a task in which you stand in one place for a long time, place one foot up on a stable object that is 2-4 inches (5-10 cm) high, such as a footstool. This helps keep your spine neutral.   Resting When lying down and resting, avoid positions that are most painful for you. Try to support your neck in a neutral position. You can use a contour pillow or a small rolled-up towel. Your pillow should support your neck but not push on it. This information is not intended to replace advice given to you by your health care provider. Make sure you discuss any questions you have with your health care provider. Document Revised: 09/05/2018 Document Reviewed: 02/14/2018 Elsevier Patient Education  2021 [employer]   Neck Exercises Ask your health care provider which exercises are safe for you. Do exercises exactly as told by your health care provider and adjust them as directed. It is normal to feel mild stretching, pulling, tightness, or discomfort as you do these exercises. Stop right away if you feel sudden pain or your pain gets worse. Do not begin these exercises until told by your health care provider. Neck exercises can be important for many reasons. They can improve strength and maintain flexibility in your neck, which will help your upper back and prevent neck pain. Stretching exercises Rotation neck  stretching 5. Sit in a chair or stand up. 6. Place your feet flat on the floor, shoulder width apart. 7. Slowly turn your head (rotate) to the right until a slight stretch is felt. Turn it all the way to the right so you can look over your right shoulder. Do not tilt or tip your head. 8. Hold this position for 10-30 seconds. 9. Slowly turn your head (rotate) to the left until a slight stretch is felt. Turn it all the way to the left so you can look over your left shoulder. Do not tilt or tip your head. 10. Hold this position for 10-30 seconds. Repeat __________ times. Complete this exercise __________ times a day.   Neck retraction 5. Sit in a sturdy chair or stand up. 6. Look straight ahead. Do not bend your neck. 7. Use your fingers to push your chin backward (retraction). Do not bend your neck for this movement. Continue to face straight ahead. If you are doing the exercise  properly, you will feel a slight sensation in your throat and a stretch at the back of your neck. 8. Hold the stretch for 1-2 seconds. Repeat __________ times. Complete this exercise __________ times a day. Strengthening exercises Neck press 6. Lie on your back on a firm bed or on the floor with a pillow under your head. 7. Use your neck muscles to push your head down on the pillow and straighten your spine. 8. Hold the position as well as you can. Keep your head facing up (in a neutral position) and your chin tucked. 9. Slowly count to 5 while holding this position. Repeat __________ times. Complete this exercise __________ times a day. Isometrics These are exercises in which you strengthen the muscles in your neck while keeping your neck still (isometrics). 5. Sit in a supportive chair and place your hand on your forehead. 6. Keep your head and face facing straight ahead. Do not flex or extend your neck while doing isometrics. 7. Push forward with your head and neck while pushing back with your hand. Hold for 10  seconds. 8. Do the sequence again, this time putting your hand against the back of your head. Use your head and neck to push backward against the hand pressure. 9. Finally, do the same exercise on either side of your head, pushing sideways against the pressure of your hand. Repeat __________ times. Complete this exercise __________ times a day. Prone head lifts 6. Lie face-down (prone position), resting on your elbows so that your chest and upper back are raised. 7. Start with your head facing downward, near your chest. Position your chin either on or near your chest. 8. Slowly lift your head upward. Lift until you are looking straight ahead. Then continue lifting your head as far back as you can comfortably stretch. 9. Hold your head up for 5 seconds. Then slowly lower it to your starting position. Repeat __________ times. Complete this exercise __________ times a day. Supine head lifts 4. Lie on your back (supine position), bending your knees to point to the ceiling and keeping your feet flat on the floor. 5. Lift your head slowly off the floor, raising your chin toward your chest. 6. Hold for 5 seconds. Repeat __________ times. Complete this exercise __________ times a day. Scapular retraction 1. Stand with your arms at your sides. Look straight ahead. 2. Slowly pull both shoulders (scapulae) backward and downward (retraction) until you feel a stretch between your shoulder blades in your upper back. 3. Hold for 10-30 seconds. 4. Relax and repeat. Repeat __________ times. Complete this exercise __________ times a day. Contact a health care provider if:  Your neck pain or discomfort gets much worse when you do an exercise.  Your neck pain or discomfort does not improve within 2 hours after you exercise. If you have any of these problems, stop exercising right away. Do not do the exercises again unless your health care provider says that you can. Get help right away if:  You develop  sudden, severe neck pain. If this happens, stop exercising right away. Do not do the exercises again unless your health care provider says that you can. This information is not intended to replace advice given to you by your health care provider. Make sure you discuss any questions you have with your health care provider. Document Revised: 03/14/2018 Document Reviewed: 03/14/2018 Elsevier Patient Education  2021 [employer].

## 2020-08-12 NOTE — Progress Notes (Signed)
Patient ID: Denise Rios, female  DOB: May 08, 1951, 70 y.o.   MRN: [id number] Patient Care Team    Relationship Specialty Notifications Start End  Natalia Leatherwood, DO PCP - General Family Medicine  02/20/19     Chief Complaint  Patient presents with  . Follow-up    cmc   Subjective: Denise Rios is a 70 y.o.  female present for [facility] Anxiety: Patient reports she started to have anxiety and dwelling on dementia about 2020.  She has a strong family history of dementia on her mother side.  6 of 8 children on her mother side have been diagnosed with Alzheimer's.  Her mother died in her 49s with Alzheimer's.  She presented to her PCP with these concerns and she was started on Wellbutrin to help her with her anxiety and focus.  She feels that it is still working rather well for her at Wellbutrin 300 mg daily.    Hypertension/HLD/overweight: Pt reports compliance with Maxide. Blood pressures ranges at home not routinely checked. Patient denies chest pain, shortness of breath, dizziness or lower extremity edema.    Syncope/vertigo: Patient reports in May 2021 she had an event where she had a syncopal episode.  She reports she had no symptoms prior to passing out other than becoming very dizzy and then blacked out.  She states it was a an extremely hot day and they were looking for a new home in the area.  She felt extremely hot and tried to walk outside to get some air, and then was found passed out.  She states she was only passed out for less than 2 minutes.  She does think she hit her head on the sidewalk on the way down.  She was taken to the emergency room with a negative work-up.  She states she did hit her head during that time.  Since then she has had some room spinning vertigo when looking towards the left and sitting forward.  She had cardiac echo and event monitoring for work-up for her her syncope without positive findings.  She has no recurrent events.   Event monitoring  01/10/2019 -Predominant normal sinus rhythm.  The heart rate ranged from 54 to 138 bpm and the average heart rate was 75 bpm. There was no atrial fibrillation. There was rare ectopic beats. There was one 5 beat episode of nonsustained ventricular tachycardia. No bradycardia arrhythmias and no pauses. Symptoms correlate with normal sinus rhythm without ectopy.  -Transthoracic echo 11/26/2018: Left ventricle: Cavity size is normal.  Wall thickness is normal.  Systolic function is normal with an estimated EF of 60-65%. Left atrium: Volume index is normal. Right atrium: Normal in size Right ventricle: Cavity size appears normal.  Systolic function is normal.   Depression screen [facility] 2/9 03/30/2020 08/23/2019 02/20/2019  Decreased Interest 0 0 0  Down, Depressed, Hopeless 0 0 0  PHQ - 2 Score 0 0 0   GAD 7 : Generalized Anxiety Score 08/23/2019 02/20/2019  Nervous, Anxious, on Edge 0 1  Control/stop worrying 0 1  Worry too much - different things 0 2  Trouble relaxing 0 0  Restless 0 0  Easily annoyed or irritable 0 0  Afraid - awful might happen 0 1  Total GAD 7 Score 0 5  Anxiety Difficulty Not difficult at all Not difficult at all          Fall Risk  03/30/2020  Falls in the past year? 0  Number falls  in past yr: 0  Injury with Fall? 0     Immunization History  Administered Date(s) Administered  . PFIZER(Purple Top)SARS-COV-2 Vaccination 06/21/2019, 07/11/2019, 04/15/2020  . Zoster Recombinat (Shingrix) 03/26/2013    No exam data present  Past Medical History:  Diagnosis Date  . Anhedonia   . Anxiety   . Arteriosclerosis of carotid artery 03/19/2019   Less than 50%  . Chicken pox   . Hematuria 12/19/2017  . History of fainting spells of unknown cause   . Hyperlipidemia   . Hypertension   . Memory loss    prioor PCP records indicate neuro referral was made  . Menopause   . Osteoporosis   . Rotator cuff (capsule) sprain    R>L  . Syncope 09/2018  . Vitamin D  deficiency    Allergies  Allergen Reactions  . Lisinopril Cough   Past Surgical History:  Procedure Laterality Date  . CESAREAN SECTION  1980  . ELECTROCARDIOGRAM  10/24/2018   SR. HR74, PR 167, QTC 406, NL-EKG  . event monitor  01/10/2019   Predominant normal sinus rhythm.  HR 54-1 38.  Average HR 75.  No atrial fib.  Rare ectopic beats.  5 beat episode of nonsustained ventricular tachycardia x1.  No bradycardia arrhythmias.  No pauses.  Normal sinus rhythm without ectopy.  . Image: CT chest  10/25/2018   normal- r/o PE  . TRANSTHORACIC ECHOCARDIOGRAM  11/26/2018   EF 60-65%.  Normal study.  Completed presyncope.  . US CAROTID DOPPLER BILATERAL ([facility] HX)  10/24/2018   Arteriolosclerosis.  Findings consistent with less than 50% stenosis.  Bilateral vertebral blood flow demonstrated.   Family History  Problem Relation Age of Onset  . Alzheimer's disease Mother        6: 8 children on her mother's side have Alzheimer's.  . Bone cancer Father   . Early death Brother   . Early death Maternal Grandfather   . Heart attack Maternal Grandfather    Social History   Social History Narrative   Marital status/children/pets: married, 2 children.    Education/employment: retired   Field seismologist:      -smoke alarm in the home:Yes     - wears seatbelt: Yes     - Feels safe in their relationships: Yes    Allergies as of 08/12/2020      Reactions   Lisinopril Cough      Medication List       Accurate as of August 12, 2020  3:55 PM. If you have any questions, ask your nurse or doctor.        amLODipine 2.5 MG tablet Commonly known as: NORVASC TAKE 1 TABLET(2.5 MG) BY MOUTH DAILY   buPROPion 300 MG 24 hr tablet Commonly known as: WELLBUTRIN XL TAKE 1 TABLET(300 MG) BY MOUTH DAILY   cetirizine 10 MG tablet Commonly known as: ZYRTEC Take 1 tablet (10 mg total) by mouth at bedtime.   diclofenac 75 MG EC tablet Commonly known as: VOLTAREN Take 1 tablet (75 mg total) by mouth 2 (two)  times daily.   fluticasone 50 MCG/ACT nasal spray Commonly known as: FLONASE Place 2 sprays into both nostrils daily.   rosuvastatin 10 MG tablet Commonly known as: CRESTOR TAKE 1 TABLET(10 MG) BY MOUTH AT BEDTIME   triamterene-hydrochlorothiazide 37.5-25 MG tablet Commonly known as: MAXZIDE-25 Take 1 tablet by mouth daily.       All past medical history, surgical history, allergies, family history, immunizations andmedications were updated in the  EMR today and reviewed under the history and medication portions of their EMR.    No results found for this or any previous visit (from the past 2160 hour(s)).  Patient was never admitted.   ROS: 14 pt review of systems performed and negative (unless mentioned in an HPI)  Objective: BP 112/74   Pulse 74   Temp 98.5 F (36.9 C) (Oral)   Ht 5\' 2"  (1.575 m)   Wt 151 lb (68.5 kg)   SpO2 99%   BMI 27.62 kg/m  Gen: Afebrile. No acute distress.  Nontoxic, pleasant female HENT: AT. [place]. Eyes:Pupils Equal Round Reactive to light, Extraocular movements intact,  Conjunctiva without redness, discharge or icterus. Neck/lymp/endocrine: Supple, no lymphadenopathy, no thyromegaly CV: RRR no murmur, no edema, +2/4 P posterior tibialis pulses Chest: CTAB, no wheeze or crackles Abd: Soft. NTND. BS present.  Skin: No rashes, purpura or petechiae.  Neuro:  Normal gait. PERLA. EOMi. Alert. Oriented x3 Psych: Normal affect, dress and demeanor. Normal speech. Normal thought content and judgment.   Assessment/plan: TENICIA GURAL is a 70 y.o. female present for [facility] Essential hypertension/hyperlipidemia/overweight Stable.  Continue Maxide 1 tab daily Longer pressure to make sure less than 135/85.  If above goal she will make an appointment to follow-up. -Low-sodium diet and exercise encouraged. CBC, CMP, TSH and lipids collected today.  She did eat half of an orange this morning. -Follow-up 5.5 months   Benign paroxysmal positional vertigo,  unspecified laterality Patient having vertigo symptoms with laying back or sitting up after laying down and looking towards the left.  Possibly secondary to her syncopal episode and hitting her head.  Discussed further work-up with neurology versus CT of her head.  Also considered vestibular rehab.  She would like to monitor for now.  -Discussed adequate hydration can play a role in BPPV -Patient was given instructions on the Epley maneuver. -Follow-up if desires further work-up.   Note is dictated utilizing voice recognition software. Although note has been proof read prior to signing, occasional typographical errors still can be missed. If any questions arise, please do not hesitate to call for verification.  Electronically signed by: [facility], DO [facility] [place]

## 2020-08-13 ENCOUNTER — Telehealth: Payer: Self-pay | Admitting: Family Medicine

## 2020-08-13 MED ORDER — ROSUVASTATIN CALCIUM 20 MG PO TABS: ORAL_TABLET | ORAL | 3 refills | Status: DC

## 2020-08-13 NOTE — Telephone Encounter (Signed)
Please call patient Liver, kidney and thyroid function are normal Blood cell counts and electrolytes are normal Glucose was normal. Cholesterol panel was drastically different from when last checked.  Her LDL went up from 108 to 143.   -I have refilled her Crestor at the next higher dose which is 20 mg.  She is currently prescribed 10 mg.  -We will recheck her LDL at her next appointment in 5.5 months.  If she asks: The LDL is not affected on whether she was fasting or not that day.

## 2020-08-13 NOTE — Telephone Encounter (Signed)
Spoke with pt regarding labs and instructions.   

## 2020-09-02 ENCOUNTER — Ambulatory Visit (INDEPENDENT_AMBULATORY_CARE_PROVIDER_SITE_OTHER): Payer: Medicare Other

## 2020-09-02 VITALS — Ht 62.0 in | Wt 151.0 lb

## 2020-09-02 DIAGNOSIS — Z0000 Encounter for general adult medical examination without abnormal findings: Secondary | ICD-10-CM | POA: Diagnosis not present

## 2020-09-02 NOTE — Progress Notes (Signed)
Subjective:   Denise Rios is a 70 y.o. female who presents for an Initial Medicare Annual Wellness Visit.   I connected with Denise Rios today by telephone and verified that I am speaking with the correct person using two identifiers. Location patient: home Location provider: work Persons participating in the virtual visit: patient, Engineer, civil (consulting).    I discussed the limitations, risks, security and privacy concerns of performing an evaluation and management service by telephone and the availability of in person appointments. I also discussed with the patient that there may be a patient responsible charge related to this service. The patient expressed understanding and verbally consented to this telephonic visit.    Interactive audio and video telecommunications were attempted between this provider and patient, however failed, due to patient having technical difficulties OR patient did not have access to video capability.  We continued and completed visit with audio only.  Some vital signs may be absent or patient reported.   Time Spent with patient on telephone encounter: 25 minutes  Review of Systems     Cardiac Risk Factors include: advanced age (>23men, >51 women);hypertension;dyslipidemia;sedentary lifestyle     Objective:    Today's Vitals   09/02/20 1330  Weight: 151 lb (68.5 kg)  Height: 5\' 2"  (1.575 m)   Body mass index is 27.62 kg/m.  Advanced Directives 09/02/2020 04/13/2020  Does Patient Have a Medical Advance Directive? No No  Would patient like information on creating a medical advance directive? Yes (MAU/Ambulatory/Procedural Areas - Information given) No - Patient declined    Current Medications (verified) Outpatient Encounter Medications as of 09/02/2020  Medication Sig  . amLODipine (NORVASC) 2.5 MG tablet TAKE 1 TABLET(2.5 MG) BY MOUTH DAILY  . buPROPion (WELLBUTRIN XL) 300 MG 24 hr tablet TAKE 1 TABLET(300 MG) BY MOUTH DAILY  . cetirizine (ZYRTEC) 10 MG tablet  Take 1 tablet (10 mg total) by mouth at bedtime.  . diclofenac (VOLTAREN) 75 MG EC tablet Take 1 tablet (75 mg total) by mouth 2 (two) times daily.  . fluticasone (FLONASE) 50 MCG/ACT nasal spray Place 2 sprays into both nostrils daily.  . rosuvastatin (CRESTOR) 20 MG tablet TAKE 1 TABLET(10 MG) BY MOUTH AT BEDTIME  . triamterene-hydrochlorothiazide (MAXZIDE-25) 37.5-25 MG tablet Take 1 tablet by mouth daily.   No facility-administered encounter medications on file as of 09/02/2020.    Allergies (verified) Lisinopril   History: Past Medical History:  Diagnosis Date  . Anhedonia   . Anxiety   . Arteriosclerosis of carotid artery 03/19/2019   Less than 50%  . Chicken pox   . Hematuria 12/19/2017  . History of fainting spells of unknown cause   . Hyperlipidemia   . Hypertension   . Memory loss    prioor PCP records indicate neuro referral was made  . Menopause   . Osteoporosis   . Rotator cuff (capsule) sprain    R>L  . Syncope 09/2018  . Vitamin D deficiency    Past Surgical History:  Procedure Laterality Date  . CESAREAN SECTION  1980  . ELECTROCARDIOGRAM  10/24/2018   SR. HR74, PR 167, QTC 406, NL-EKG  . event monitor  01/10/2019   Predominant normal sinus rhythm.  HR 54-1 38.  Average HR 75.  No atrial fib.  Rare ectopic beats.  5 beat episode of nonsustained ventricular tachycardia x1.  No bradycardia arrhythmias.  No pauses.  Normal sinus rhythm without ectopy.  . Image: CT chest  10/25/2018   normal- r/o PE  .  TRANSTHORACIC ECHOCARDIOGRAM  11/26/2018   EF 60-65%.  Normal study.  Completed presyncope.  . US CAROTID DOPPLER BILATERAL ([facility] HX)  10/24/2018   Arteriolosclerosis.  Findings consistent with less than 50% stenosis.  Bilateral vertebral blood flow demonstrated.   Family History  Problem Relation Age of Onset  . Alzheimer's disease Mother        6: 8 children on her mother's side have Alzheimer's.  . Bone cancer Father   . Early death Brother   . Early  death Maternal Grandfather   . Heart attack Maternal Grandfather    Social History   Socioeconomic History  . Marital status: Married    Spouse name: Not on file  . Number of children: Not on file  . Years of education: Not on file  . Highest education level: Not on file  Occupational History  . Occupation: retired  Tobacco Use  . Smoking status: Never Smoker  . Smokeless tobacco: Never Used  Vaping Use  . Vaping Use: Never used  Substance and Sexual Activity  . Alcohol use: Yes  . Drug use: Never  . Sexual activity: Not Currently    Partners: Male  Other Topics Concern  . Not on file  Social History Narrative   Marital status/children/pets: married, 2 children.    Education/employment: retired   Field seismologist:      -smoke alarm in the home:Yes     - wears seatbelt: Yes     - Feels safe in their relationships: Yes   Social Determinants of Health   Financial Resource Strain: Low Risk   . Difficulty of Paying Living Expenses: Not hard at all  Food Insecurity: No Food Insecurity  . Worried About Programme researcher, broadcasting/film/video in the Last Year: Never true  . Ran Out of Food in the Last Year: Never true  Transportation Needs: No Transportation Needs  . Lack of Transportation (Medical): No  . Lack of Transportation (Non-Medical): No  Physical Activity: Inactive  . Days of Exercise per Week: 0 days  . Minutes of Exercise per Session: 0 min  Stress: No Stress Concern Present  . Feeling of Stress : Not at all  Social Connections: Moderately Isolated  . Frequency of Communication with Friends and Family: More than three times a week  . Frequency of Social Gatherings with Friends and Family: More than three times a week  . Attends Religious Services: Never  . Active Member of Clubs or Organizations: No  . Attends Banker Meetings: Never  . Marital Status: Married    Tobacco Counseling Counseling given: Not Answered   Clinical Intake:  Pre-visit preparation completed:  Yes  Pain : No/denies pain     Nutritional Status: BMI 25 -29 Overweight Nutritional Risks: None Diabetes: No  How often do you need to have someone help you when you read instructions, pamphlets, or other written materials from your doctor or pharmacy?: 1 - Never  Diabetic?No  Interpreter Needed?: No  Information entered by :: Thomasenia Sales LPN   Activities of Daily Living In your present state of health, do you have any difficulty performing the following activities: 09/02/2020  Hearing? N  Vision? N  Difficulty concentrating or making decisions? N  Walking or climbing stairs? N  Dressing or bathing? N  Doing errands, shopping? N  Preparing Food and eating ? N  Using the Toilet? N  In the past six months, have you accidently leaked urine? N  Do you have problems with loss of  bowel control? N  Managing your Medications? N  Managing your Finances? N  Housekeeping or managing your Housekeeping? N  Some recent data might be hidden    Patient Care Team: Natalia LeatherwoodKuneff, Renee A, DO as PCP - General (Family Medicine)  Indicate any recent Medical Services you may have received from other than [facility] providers in the past year (date may be approximate).     Assessment:   This is a routine wellness examination for Denise Hashimotoatricia.  Hearing/Vision screen  Hearing Screening   125Hz  250Hz  500Hz  1000Hz  2000Hz  3000Hz  4000Hz  6000Hz  8000Hz   Right ear:           Left ear:           Comments: No issues  Vision Screening Comments: Last eye exam-05/2020-Dr. Sellars  Dietary issues and exercise activities discussed: Exercise limited by: None identified  Goals    . Patient Stated     Would like to lose about 5 pounds, drink more & eat healthier      Depression Screen PHQ 2/9 Scores 09/02/2020 03/30/2020 08/23/2019 02/20/2019  PHQ - 2 Score 0 0 0 0    Fall Risk Fall Risk  09/02/2020 03/30/2020  Falls in the past year? 0 0  Number falls in past yr: 0 0  Injury with Fall? 0 0  Follow up Falls  prevention discussed -    FALL RISK PREVENTION PERTAINING TO THE HOME:  Any stairs in or around the home? No  Home free of loose throw rugs in walkways, pet beds, electrical cords, etc? Yes  Adequate lighting in your home to reduce risk of falls? Yes   ASSISTIVE DEVICES UTILIZED TO PREVENT FALLS:  Life alert? No  Use of a cane, walker or w/c? No  Grab bars in the bathroom? Yes  Shower chair or bench in shower? No  Elevated toilet seat or a handicapped toilet? No   TIMED UP AND GO:  Was the test performed? No phone visit.    Cognitive Function:Normal cognitive status assessed by this Nurse Health Advisor. No abnormalities found.          Immunizations Immunization History  Administered Date(s) Administered  . PFIZER(Purple Top)SARS-COV-2 Vaccination 06/21/2019, 07/11/2019, 04/15/2020  . Zoster Recombinat (Shingrix) 03/26/2013    TDAP status: Due, Education has been provided regarding the importance of this vaccine. Advised may receive this vaccine at local pharmacy or Health Dept. Aware to provide a copy of the vaccination record if obtained from local pharmacy or Health Dept. Verbalized acceptance and understanding.  Flu Vaccine status: Declined, Education has been provided regarding the importance of this vaccine but patient still declined. Advised may receive this vaccine at local pharmacy or Health Dept. Aware to provide a copy of the vaccination record if obtained from local pharmacy or Health Dept. Verbalized acceptance and understanding.  Pneumococcal vaccine status: Declined,  Education has been provided regarding the importance of this vaccine but patient still declined. Advised may receive this vaccine at local pharmacy or Health Dept. Aware to provide a copy of the vaccination record if obtained from local pharmacy or Health Dept. Verbalized acceptance and understanding.   Covid-19 vaccine status: Declined, Education has been provided regarding the importance of  this vaccine but patient still declined. Advised may receive this vaccine at local pharmacy or Health Dept.or vaccine clinic. Aware to provide a copy of the vaccination record if obtained from local pharmacy or Health Dept. Verbalized acceptance and understanding.  Qualifies for Shingles Vaccine? Yes   Zostavax completed  Yes   Shingrix Completed?: No.    Education has been provided regarding the importance of this vaccine. Patient has been advised to call insurance company to determine out of pocket expense if they have not yet received this vaccine. Advised may also receive vaccine at local pharmacy or Health Dept. Verbalized acceptance and understanding.  Screening Tests Health Maintenance  Topic Date Due  . MAMMOGRAM  08/12/2021 (Originally 02/22/2020)  . DEXA SCAN  08/12/2021 (Originally 12/28/2019)  . TETANUS/TDAP  08/12/2021 (Originally 09/16/1969)  . PNA vac Low Risk Adult (1 of 2 - PCV13) 08/12/2021 (Originally 09/15/2017)  . INFLUENZA VACCINE  12/28/2020  . COLONOSCOPY (Pts 45-49yrs Insurance coverage will need to be confirmed)  05/30/2024  . COVID-19 Vaccine  Completed  . HPV VACCINES  Aged Out  . Hepatitis C Screening  Discontinued    Health Maintenance  There are no preventive care reminders to display for this patient.  Colorectal cancer screening: Type of screening: Colonoscopy. Completed 05/30/2014. Repeat every 10 years  Mammogram status: Due-Declined  Bone Density status: Due-Declined  Lung Cancer Screening: (Low Dose CT Chest recommended if Age 53-80 years, 30 pack-year currently smoking OR have quit w/in 15years.) does not qualify.     Additional Screening:  Hepatitis C Screening: does qualify; Declined  Vision Screening: Recommended annual ophthalmology exams for early detection of glaucoma and other disorders of the eye. Is the patient up to date with their annual eye exam?  Yes  Who is the provider or what is the name of the office in which the patient attends  annual eye exams? Dr. Jaquita Rector   Dental Screening: Recommended annual dental exams for proper oral hygiene  Community Resource Referral / Chronic Care Management: CRR required this visit?  No   CCM required this visit?  No      Plan:     I have personally reviewed and noted the following in the patient's chart:   . Medical and social history . Use of alcohol, tobacco or illicit drugs  . Current medications and supplements . Functional ability and status . Nutritional status . Physical activity . Advanced directives . List of other physicians . Hospitalizations, surgeries, and ER visits in previous 12 months . Vitals . Screenings to include cognitive, depression, and falls . Referrals and appointments  In addition, I have reviewed and discussed with patient certain preventive protocols, quality metrics, and best practice recommendations. A written personalized care plan for preventive services as well as general preventive health recommendations were provided to patient.   Due to this being a telephonic visit, the after visit summary with patients personalized plan was offered to patient via mail or my-chart.  Patient would like to access on my-chart.   Roanna Raider, LPN   12/02/2829  Nurse Health Advisor  Nurse Notes: None

## 2020-09-02 NOTE — Patient Instructions (Signed)
Denise Rios , Thank you for taking time to complete your Medicare Wellness Visit. I appreciate your ongoing commitment to your health goals. Please review the following plan we discussed and let me know if I can assist you in the future.   Screening recommendations/referrals: Colonoscopy: Completed 05/30/2014-Due 05/30/2024 Mammogram: Declined today. Please call the office to schedule if you change your mind. Bone Density: Declined today. Please call the office to schedule if you change your mind. Recommended yearly ophthalmology/optometry visit for glaucoma screening and checkup Recommended yearly dental visit for hygiene and checkup  Vaccinations: Influenza vaccine: Declined Pneumococcal vaccine: Due-May obtain vaccine at pur office or your local pharmacy. Tdap vaccine: Discuss with pharmacy Shingles vaccine:  Discuss with pharmacy Covid-19:Completed vaccines  Advanced directives: Information mailed today  Conditions/risks identified: See problem list  Next appointment: Follow up in one year for your annual wellness visit 09/08/2021 @ 1:30   Preventive Care 65 Years and Older, Female Preventive care refers to lifestyle choices and visits with your health care provider that can promote health and wellness. What does preventive care include?  A yearly physical exam. This is also called an annual well check.  Dental exams once or twice a year.  Routine eye exams. Ask your health care provider how often you should have your eyes checked.  Personal lifestyle choices, including:  Daily care of your teeth and gums.  Regular physical activity.  Eating a healthy diet.  Avoiding tobacco and drug use.  Limiting alcohol use.  Practicing safe sex.  Taking low-dose aspirin every day.  Taking vitamin and mineral supplements as recommended by your health care provider. What happens during an annual well check? The services and screenings done by your health care provider during your  annual well check will depend on your age, overall health, lifestyle risk factors, and family history of disease. Counseling  Your health care provider may ask you questions about your:  Alcohol use.  Tobacco use.  Drug use.  Emotional well-being.  Home and relationship well-being.  Sexual activity.  Eating habits.  History of falls.  Memory and ability to understand (cognition).  Work and work Astronomer.  Reproductive health. Screening  You may have the following tests or measurements:  Height, weight, and BMI.  Blood pressure.  Lipid and cholesterol levels. These may be checked every 5 years, or more frequently if you are over 9 years old.  Skin check.  Lung cancer screening. You may have this screening every year starting at age 67 if you have a 30-pack-year history of smoking and currently smoke or have quit within the past 15 years.  Fecal occult blood test (FOBT) of the stool. You may have this test every year starting at age 75.  Flexible sigmoidoscopy or colonoscopy. You may have a sigmoidoscopy every 5 years or a colonoscopy every 10 years starting at age 76.  Hepatitis C blood test.  Hepatitis B blood test.  Sexually transmitted disease (STD) testing.  Diabetes screening. This is done by checking your blood sugar (glucose) after you have not eaten for a while (fasting). You may have this done every 1-3 years.  Bone density scan. This is done to screen for osteoporosis. You may have this done starting at age 29.  Mammogram. This may be done every 1-2 years. Talk to your health care provider about how often you should have regular mammograms. Talk with your health care provider about your test results, treatment options, and if necessary, the need for more tests.  Vaccines  Your health care provider may recommend certain vaccines, such as:  Influenza vaccine. This is recommended every year.  Tetanus, diphtheria, and acellular pertussis (Tdap, Td)  vaccine. You may need a Td booster every 10 years.  Zoster vaccine. You may need this after age 53.  Pneumococcal 13-valent conjugate (PCV13) vaccine. One dose is recommended after age 19.  Pneumococcal polysaccharide (PPSV23) vaccine. One dose is recommended after age 40. Talk to your health care provider about which screenings and vaccines you need and how often you need them. This information is not intended to replace advice given to you by your health care provider. Make sure you discuss any questions you have with your health care provider. Document Released: 06/12/2015 Document Revised: 02/03/2016 Document Reviewed: 03/17/2015 Elsevier Interactive Patient Education  2017 [employer] Prevention in the Home Falls can cause injuries. They can happen to people of all ages. There are many things you can do to make your home safe and to help prevent falls. What can I do on the outside of my home?  Regularly fix the edges of walkways and driveways and fix any cracks.  Remove anything that might make you trip as you walk through a door, such as a raised step or threshold.  Trim any bushes or trees on the path to your home.  Use bright outdoor lighting.  Clear any walking paths of anything that might make someone trip, such as rocks or tools.  Regularly check to see if handrails are loose or broken. Make sure that both sides of any steps have handrails.  Any raised decks and porches should have guardrails on the edges.  Have any leaves, snow, or ice cleared regularly.  Use sand or salt on walking paths during winter.  Clean up any spills in your garage right away. This includes oil or grease spills. What can I do in the bathroom?  Use night lights.  Install grab bars by the toilet and in the tub and shower. Do not use towel bars as grab bars.  Use non-skid mats or decals in the tub or shower.  If you need to sit down in the shower, use a plastic, non-slip  stool.  Keep the floor dry. Clean up any water that spills on the floor as soon as it happens.  Remove soap buildup in the tub or shower regularly.  Attach bath mats securely with double-sided non-slip rug tape.  Do not have throw rugs and other things on the floor that can make you trip. What can I do in the bedroom?  Use night lights.  Make sure that you have a light by your bed that is easy to reach.  Do not use any sheets or blankets that are too big for your bed. They should not hang down onto the floor.  Have a firm chair that has side arms. You can use this for support while you get dressed.  Do not have throw rugs and other things on the floor that can make you trip. What can I do in the kitchen?  Clean up any spills right away.  Avoid walking on wet floors.  Keep items that you use a lot in easy-to-reach places.  If you need to reach something above you, use a strong step stool that has a grab bar.  Keep electrical cords out of the way.  Do not use floor polish or wax that makes floors slippery. If you must use wax, use non-skid floor wax.  Do not have throw rugs and other things on the floor that can make you trip. What can I do with my stairs?  Do not leave any items on the stairs.  Make sure that there are handrails on both sides of the stairs and use them. Fix handrails that are broken or loose. Make sure that handrails are as long as the stairways.  Check any carpeting to make sure that it is firmly attached to the stairs. Fix any carpet that is loose or worn.  Avoid having throw rugs at the top or bottom of the stairs. If you do have throw rugs, attach them to the floor with carpet tape.  Make sure that you have a light switch at the top of the stairs and the bottom of the stairs. If you do not have them, ask someone to add them for you. What else can I do to help prevent falls?  Wear shoes that:  Do not have high heels.  Have rubber bottoms.  Are  comfortable and fit you well.  Are closed at the toe. Do not wear sandals.  If you use a stepladder:  Make sure that it is fully opened. Do not climb a closed stepladder.  Make sure that both sides of the stepladder are locked into place.  Ask someone to hold it for you, if possible.  Clearly mark and make sure that you can see:  Any grab bars or handrails.  First and last steps.  Where the edge of each step is.  Use tools that help you move around (mobility aids) if they are needed. These include:  Canes.  Walkers.  Scooters.  Crutches.  Turn on the lights when you go into a dark area. Replace any light bulbs as soon as they burn out.  Set up your furniture so you have a clear path. Avoid moving your furniture around.  If any of your floors are uneven, fix them.  If there are any pets around you, be aware of where they are.  Review your medicines with your doctor. Some medicines can make you feel dizzy. This can increase your chance of falling. Ask your doctor what other things that you can do to help prevent falls. This information is not intended to replace advice given to you by your health care provider. Make sure you discuss any questions you have with your health care provider. Document Released: 03/12/2009 Document Revised: 10/22/2015 Document Reviewed: 06/20/2014 Elsevier Interactive Patient Education  2017 [employer].

## 2020-10-16 ENCOUNTER — Other Ambulatory Visit: Payer: Self-pay

## 2020-10-16 MED ORDER — BUPROPION HCL ER (XL) 300 MG PO TB24: ORAL_TABLET | ORAL | 1 refills | Status: DC

## 2021-02-12 ENCOUNTER — Ambulatory Visit (INDEPENDENT_AMBULATORY_CARE_PROVIDER_SITE_OTHER): Payer: Medicare Other | Admitting: Family Medicine

## 2021-02-12 ENCOUNTER — Encounter: Payer: Self-pay | Admitting: Family Medicine

## 2021-02-12 ENCOUNTER — Other Ambulatory Visit: Payer: Self-pay

## 2021-02-12 VITALS — BP 129/84 | HR 70 | Temp 98.1°F | Ht 62.0 in | Wt 148.0 lb

## 2021-02-12 DIAGNOSIS — M545 Low back pain, unspecified: Secondary | ICD-10-CM

## 2021-02-12 DIAGNOSIS — Z1231 Encounter for screening mammogram for malignant neoplasm of breast: Secondary | ICD-10-CM

## 2021-02-12 DIAGNOSIS — E663 Overweight: Secondary | ICD-10-CM | POA: Diagnosis not present

## 2021-02-12 DIAGNOSIS — F419 Anxiety disorder, unspecified: Secondary | ICD-10-CM | POA: Insufficient documentation

## 2021-02-12 DIAGNOSIS — M542 Cervicalgia: Secondary | ICD-10-CM

## 2021-02-12 DIAGNOSIS — M810 Age-related osteoporosis without current pathological fracture: Secondary | ICD-10-CM

## 2021-02-12 DIAGNOSIS — I10 Essential (primary) hypertension: Secondary | ICD-10-CM | POA: Diagnosis not present

## 2021-02-12 DIAGNOSIS — E2839 Other primary ovarian failure: Secondary | ICD-10-CM

## 2021-02-12 DIAGNOSIS — E782 Mixed hyperlipidemia: Secondary | ICD-10-CM

## 2021-02-12 DIAGNOSIS — H8110 Benign paroxysmal vertigo, unspecified ear: Secondary | ICD-10-CM

## 2021-02-12 LAB — HEPATIC FUNCTION PANEL
ALT: 15 U/L (ref 0–35)
AST: 20 U/L (ref 0–37)
Albumin: 4.4 g/dL (ref 3.5–5.2)
Alkaline Phosphatase: 57 U/L (ref 39–117)
Bilirubin, Direct: 0.1 mg/dL (ref 0.0–0.3)
Total Bilirubin: 0.3 mg/dL (ref 0.2–1.2)
Total Protein: 6.9 g/dL (ref 6.0–8.3)

## 2021-02-12 LAB — LDL CHOLESTEROL, DIRECT: Direct LDL: 98 mg/dL

## 2021-02-12 MED ORDER — FLUTICASONE PROPIONATE 50 MCG/ACT NA SUSP: 2.0000 | Freq: Every day | NASAL | 6 refills | Status: DC

## 2021-02-12 MED ORDER — BUPROPION HCL ER (XL) 300 MG PO TB24: ORAL_TABLET | ORAL | 1 refills | Status: DC

## 2021-02-12 MED ORDER — CETIRIZINE HCL 10 MG PO TABS: 10.0000 mg | ORAL_TABLET | Freq: Every day | ORAL | 11 refills | Status: DC

## 2021-02-12 MED ORDER — TRIAMTERENE-HCTZ 37.5-25 MG PO TABS: 1.0000 | ORAL_TABLET | Freq: Every day | ORAL | 1 refills | Status: DC

## 2021-02-12 MED ORDER — AMLODIPINE BESYLATE 2.5 MG PO TABS: ORAL_TABLET | ORAL | 1 refills | Status: DC

## 2021-02-12 MED ORDER — ROSUVASTATIN CALCIUM 20 MG PO TABS: ORAL_TABLET | ORAL | 3 refills | Status: DC

## 2021-02-12 MED ORDER — DICLOFENAC SODIUM 75 MG PO TBEC: 75.0000 mg | DELAYED_RELEASE_TABLET | Freq: Two times a day (BID) | ORAL | 5 refills | Status: DC

## 2021-02-12 NOTE — Progress Notes (Addendum)
Patient ID: Denise Rios, female  DOB: May 06, 1951, 70 y.o.   MRN: [id number] Patient Care Team    Relationship Specialty Notifications Start End  Natalia Leatherwood, DO PCP - General Family Medicine  02/20/19     Chief Complaint  Patient presents with   Hyperlipidemia    Pt is not fasting   Subjective: Denise Rios is a 70 y.o.  female present for [facility] Anxiety: Patient reports she started to have anxiety 2/2 to dwelling on dementia about 2020.  She has a strong family history of dementia on her mother side.  6 of 8 children on her mother side have been diagnosed with Alzheimer's.  Her mother died in her 57s with Alzheimer's.  She presented to her PCP with these concerns and she was started on Wellbutrin to help her with her anxiety and focus.  She feels that it is still working rather well for her at Wellbutrin 300 mg daily. She would like to continue medication .    Hypertension/HLD/overweight: Pt reports compliance  with Maxide. Blood pressures ranges at home not routinely checked. Patient denies chest pain, shortness of breath, dizziness or lower extremity edema.  She is now routinely taking crestor for her cholesterol, last LDL increased a fair amount to 143.    Syncope/vertigo: Patient reports in May 2021 she had an event where she had a syncopal episode.  She reports she had no symptoms prior to passing out other than becoming very dizzy and then blacked out.  She states it was a an extremely hot day and they were looking for a new home in the area.  She felt extremely hot and tried to walk outside to get some air, and then was found passed out.  She states she was only passed out for less than 2 minutes.  She does think she hit her head on the sidewalk on the way down.  She was taken to the emergency room with a negative work-up.  She states she did hit her head during that time.  Since then she has had some room spinning vertigo when looking towards the left and sitting forward.   She had cardiac echo and event monitoring for work-up for her her syncope without positive findings.  She has no recurrent events.   Event monitoring 01/10/2019 -Predominant normal sinus rhythm.  The heart rate ranged from 54 to 138 bpm and the average heart rate was 75 bpm. There was no atrial fibrillation. There was rare ectopic beats. There was one 5 beat episode of nonsustained ventricular tachycardia. No bradycardia arrhythmias and no pauses. Symptoms correlate with normal sinus rhythm without ectopy.  -Transthoracic echo 11/26/2018: Left ventricle: Cavity size is normal.  Wall thickness is normal.  Systolic function is normal with an estimated EF of 60-65%. Left atrium: Volume index is normal. Right atrium: Normal in size Right ventricle: Cavity size appears normal.  Systolic function is normal.   Depression screen [facility] 2/9 09/02/2020 03/30/2020 08/23/2019 02/20/2019  Decreased Interest 0 0 0 0  Down, Depressed, Hopeless 0 0 0 0  PHQ - 2 Score 0 0 0 0   GAD 7 : Generalized Anxiety Score 08/23/2019 02/20/2019  Nervous, Anxious, on Edge 0 1  Control/stop worrying 0 1  Worry too much - different things 0 2  Trouble relaxing 0 0  Restless 0 0  Easily annoyed or irritable 0 0  Afraid - awful might happen 0 1  Total GAD 7 Score 0 5  Anxiety Difficulty Not difficult at all Not difficult at all          Fall Risk  09/02/2020 03/30/2020  Falls in the past year? 0 0  Number falls in past yr: 0 0  Injury with Fall? 0 0  Follow up Falls prevention discussed -     Immunization History  Administered Date(s) Administered   PFIZER(Purple Top)SARS-COV-2 Vaccination 06/21/2019, 07/11/2019, 04/15/2020   Zoster Recombinat (Shingrix) 03/26/2013    No results found.  Past Medical History:  Diagnosis Date   Anhedonia    Anxiety    Arteriosclerosis of carotid artery 03/19/2019   Less than 50%   Chicken pox    Hematuria 12/19/2017   History of fainting spells of unknown cause     Hyperlipidemia    Hypertension    Memory loss    prioor PCP records indicate neuro referral was made   Menopause    Osteoporosis    Rotator cuff (capsule) sprain    R>L   Syncope 09/2018   Vitamin D deficiency    Allergies  Allergen Reactions   Lisinopril Cough   Past Surgical History:  Procedure Laterality Date   CESAREAN SECTION  1980   ELECTROCARDIOGRAM  10/24/2018   SR. HR74, PR 167, QTC 406, NL-EKG   event monitor  01/10/2019   Predominant normal sinus rhythm.  HR 54-1 38.  Average HR 75.  No atrial fib.  Rare ectopic beats.  5 beat episode of nonsustained ventricular tachycardia x1.  No bradycardia arrhythmias.  No pauses.  Normal sinus rhythm without ectopy.   Image: CT chest  10/25/2018   normal- r/o PE   TRANSTHORACIC ECHOCARDIOGRAM  11/26/2018   EF 60-65%.  Normal study.  Completed presyncope.   US CAROTID DOPPLER BILATERAL ([facility] HX)  10/24/2018   Arteriolosclerosis.  Findings consistent with less than 50% stenosis.  Bilateral vertebral blood flow demonstrated.   Family History  Problem Relation Age of Onset   Alzheimer's disease Mother        6: 8 children on her mother's side have Alzheimer's.   Bone cancer Father    Early death Brother    Early death Maternal Grandfather    Heart attack Maternal Grandfather    Social History   Social History Narrative   Marital status/children/pets: married, 2 children.    Education/employment: retired   Field seismologist:      -smoke alarm in the home:Yes     - wears seatbelt: Yes     - Feels safe in their relationships: Yes    Allergies as of 02/12/2021       Reactions   Lisinopril Cough        Medication List        Accurate as of February 12, 2021  1:36 PM. If you have any questions, ask your nurse or doctor.          amLODipine 2.5 MG tablet Commonly known as: NORVASC TAKE 1 TABLET(2.5 MG) BY MOUTH DAILY   buPROPion 300 MG 24 hr tablet Commonly known as: WELLBUTRIN XL TAKE 1 TABLET(300 MG) BY MOUTH  DAILY   cetirizine 10 MG tablet Commonly known as: ZYRTEC Take 1 tablet (10 mg total) by mouth at bedtime.   diclofenac 75 MG EC tablet Commonly known as: VOLTAREN Take 1 tablet (75 mg total) by mouth 2 (two) times daily.   fluticasone 50 MCG/ACT nasal spray Commonly known as: FLONASE Place 2 sprays into both nostrils daily.   rosuvastatin 20 MG tablet  Commonly known as: CRESTOR TAKE 1 TABLET(10 MG) BY MOUTH AT BEDTIME   triamterene-hydrochlorothiazide 37.5-25 MG tablet Commonly known as: MAXZIDE-25 Take 1 tablet by mouth daily.        All past medical history, surgical history, allergies, family history, immunizations andmedications were updated in the EMR today and reviewed under the history and medication portions of their EMR.    No results found for this or any previous visit (from the past 2160 hour(s)).  Patient was never admitted.   ROS: 14 pt review of systems performed and negative (unless mentioned in an HPI)  Objective: BP 129/84   Pulse 70   Temp 98.1 F (36.7 C) (Oral)   Ht 5\' 2"  (1.575 m)   Wt 148 lb (67.1 kg)   SpO2 96%   BMI 27.07 kg/m  Gen: Afebrile. No acute distress.  HENT: AT. [place]. No cough or shortness of breath Eyes:Pupils Equal Round Reactive to light, Extraocular movements intact,  Conjunctiva without redness, discharge or icterus. Neck/lymp/endocrine: Supple,no lymphadenopathy, no thyromegaly CV: RRR no murmur, no edema Chest: CTAB, no wheeze or crackles Neuro:Normal gait. PERLA. EOMi. Alert. Oriented x3 Psych: Normal affect, dress and demeanor. Normal speech. Normal thought content and judgment.   Assessment/plan: Denise Rios is a 70 y.o. female present for [facility] Essential hypertension/hyperlipidemia/overweight Stable.  Continue Maxide 1 tab daily Continue Crestor: LFT and LDL collected today Goal : < 135/85.  If above goal she will make an appointment to follow-up. - Direct LDL - Hepatic function panel -Low-sodium diet and  exercise encouraged. -Follow-up 5.5 months  Anxiousness: - stable. continue Wellbutrin 300 mg QD  Benign paroxysmal positional vertigo, unspecified laterality Patient having vertigo symptoms with laying back or sitting up after laying down and looking towards the left.  Possibly secondary to her syncopal episode and hitting her head.  Discussed further work-up with neurology versus CT of her head.  Also considered vestibular rehab.  She would like to monitor for now.  -Discussed adequate hydration can play a role in BPPV -Patient was given instructions on the Epley maneuver. -Follow-up if desires further work-up.  Lumbar pain - diclofenac (VOLTAREN) 75 MG EC tablet; Take 1 tablet (75 mg total) by mouth 2 (two) times daily.  Dispense: 60 tablet; Refill: 5   Breast cancer screening by mammogram - MM 3D SCREEN BREAST BILATERAL; Future Osteoporosis, unspecified osteoporosis type, unspecified pathological fracture presence/Estrogen deficiency/ - DG Bone Density; Future    Note is dictated utilizing voice recognition software. Although note has been proof read prior to signing, occasional typographical errors still can be missed. If any questions arise, please do not hesitate to call for verification.  Electronically signed by: [facility], DO [facility] [place]

## 2021-02-12 NOTE — Patient Instructions (Signed)
Great to see you today.  I have refilled the medication(s) we provide.   If labs were collected, we will inform you of lab results once received either by echart message or telephone call.   - echart message- for normal results that have been seen by the patient already.   - telephone call: abnormal results or if patient has not viewed results in their echart.  

## 2021-03-29 ENCOUNTER — Encounter (HOSPITAL_BASED_OUTPATIENT_CLINIC_OR_DEPARTMENT_OTHER): Payer: Self-pay

## 2021-03-29 ENCOUNTER — Ambulatory Visit (HOSPITAL_BASED_OUTPATIENT_CLINIC_OR_DEPARTMENT_OTHER)
Admission: RE | Admit: 2021-03-29 | Discharge: 2021-03-29 | Disposition: A | Discharge status: Home / Self Care | Payer: Medicare Other | Source: Ambulatory Visit | Attending: Family Medicine | Admitting: Family Medicine

## 2021-03-29 ENCOUNTER — Other Ambulatory Visit: Payer: Self-pay

## 2021-03-29 DIAGNOSIS — Z1231 Encounter for screening mammogram for malignant neoplasm of breast: Secondary | ICD-10-CM

## 2021-03-29 DIAGNOSIS — E2839 Other primary ovarian failure: Secondary | ICD-10-CM | POA: Insufficient documentation

## 2021-03-30 ENCOUNTER — Telehealth: Payer: Self-pay | Admitting: Family Medicine

## 2021-03-30 MED ORDER — ALENDRONATE SODIUM 70 MG PO TABS: 70.0000 mg | ORAL_TABLET | ORAL | 11 refills | Status: DC

## 2021-03-30 NOTE — Telephone Encounter (Signed)
Please inform patient T-score of-3.5. This is rather significant osteoporosis.  He has had osteoporosis for some time, and by her prior records she was offered prescribed medication and had declined. If she would like to consider starting Fosamax or attempt to get Prolia injections every 6 months approved, we can start this process for her.    General recommendations for osteoporosis: 1.) Drink alcohol in moderation only: No more than 4 drinks a day for men and no more than 2 drinks a day for women.  2.) Decrease caffeine consumption to no  More than 2.5 cups per day 3.) Exercise: weight bearing (walking counts), strength and balance training. 4.) No smoking.  5.) Sunlight/Ultraviolet light exposure 30 minutes a day/5 days a week. 6.)  Maintaining adequate vitamin D and calcium levels, supplementing Calcium 1200 mg daily and vitamin D 1000 units daily

## 2021-03-30 NOTE — Telephone Encounter (Signed)
Fosamax prescribed.  

## 2021-03-30 NOTE — Addendum Note (Signed)
Addended by: Felix Pacini A on: 03/30/2021 03:21 PM   Modules accepted: Orders

## 2021-03-30 NOTE — Telephone Encounter (Signed)
Spoke with pt regarding labs and instructions. Pt will like to try the Fosamax. Precautions of med discussed.

## 2021-04-06 ENCOUNTER — Encounter: Payer: Self-pay | Admitting: Family Medicine

## 2021-04-14 ENCOUNTER — Encounter: Payer: Self-pay | Admitting: Family Medicine

## 2021-05-14 ENCOUNTER — Other Ambulatory Visit: Payer: Self-pay | Admitting: Family Medicine

## 2021-06-07 ENCOUNTER — Other Ambulatory Visit: Payer: Self-pay | Admitting: Family Medicine

## 2021-06-07 ENCOUNTER — Encounter: Payer: Self-pay | Admitting: Family Medicine

## 2021-06-08 ENCOUNTER — Other Ambulatory Visit: Payer: Self-pay | Admitting: Family Medicine

## 2021-06-18 ENCOUNTER — Other Ambulatory Visit: Payer: Self-pay | Admitting: Family Medicine

## 2021-06-21 NOTE — Telephone Encounter (Signed)
Spoke with pharmacies and pt regarding transferred medication. Pharmacy will be transferring last medication today.

## 2021-07-30 ENCOUNTER — Ambulatory Visit (INDEPENDENT_AMBULATORY_CARE_PROVIDER_SITE_OTHER): Payer: Medicare Other | Admitting: Family Medicine

## 2021-07-30 ENCOUNTER — Other Ambulatory Visit: Payer: Self-pay

## 2021-07-30 ENCOUNTER — Encounter: Payer: Self-pay | Admitting: Family Medicine

## 2021-07-30 VITALS — BP 106/73 | HR 71 | Temp 98.6°F | Ht 62.0 in | Wt 142.0 lb

## 2021-07-30 DIAGNOSIS — M810 Age-related osteoporosis without current pathological fracture: Secondary | ICD-10-CM

## 2021-07-30 DIAGNOSIS — I10 Essential (primary) hypertension: Secondary | ICD-10-CM | POA: Diagnosis not present

## 2021-07-30 DIAGNOSIS — F419 Anxiety disorder, unspecified: Secondary | ICD-10-CM

## 2021-07-30 DIAGNOSIS — M545 Low back pain, unspecified: Secondary | ICD-10-CM | POA: Insufficient documentation

## 2021-07-30 DIAGNOSIS — E782 Mixed hyperlipidemia: Secondary | ICD-10-CM

## 2021-07-30 MED ORDER — BUPROPION HCL ER (XL) 300 MG PO TB24: ORAL_TABLET | ORAL | 1 refills | Status: DC

## 2021-07-30 MED ORDER — AMLODIPINE BESYLATE 2.5 MG PO TABS: ORAL_TABLET | ORAL | 1 refills | Status: DC

## 2021-07-30 MED ORDER — DICLOFENAC SODIUM 75 MG PO TBEC: 75.0000 mg | DELAYED_RELEASE_TABLET | Freq: Two times a day (BID) | ORAL | 5 refills | Status: DC

## 2021-07-30 MED ORDER — TRIAMTERENE-HCTZ 37.5-25 MG PO TABS: 1.0000 | ORAL_TABLET | Freq: Every day | ORAL | 1 refills | Status: DC

## 2021-07-30 MED ORDER — FLUTICASONE PROPIONATE 50 MCG/ACT NA SUSP: 2.0000 | Freq: Every day | NASAL | 6 refills | Status: DC

## 2021-07-30 NOTE — Progress Notes (Signed)
Patient ID: Denise Rios, female  DOB: 12/20/1950, 71 y.o.   MRN: [id number] Patient Care Team    Relationship Specialty Notifications Start End  Natalia Leatherwood, DO PCP - General Family Medicine  02/20/19     Chief Complaint  Patient presents with   Anxiety    CMC; pt is not fasting    Subjective: Denise Rios is a 71 y.o.  female present for [facility] Anxiety: Patient reports she started to have anxiety 2/2 to dwelling on dementia about 2020.  She has a strong family history of dementia on her mother side.  6 of 8 children on her mother side have been diagnosed with Alzheimer's.  Her mother died in her 65s with Alzheimer's.  She presented to her PCP with these concerns and she was started on Wellbutrin to help her with her anxiety and focus.  She feels that it is working very well  at Wellbutrin 300 mg daily. She would like to continue medication .    Hypertension/HLD/overweight: Pt reports compliance with Maxide. Blood pressures ranges at home not routinely checked. Patient denies chest pain, shortness of breath, dizziness or lower extremity edema.   She is now routinely taking crestor for her cholesterol, last LDL increased a fair amount to 143.    Syncope/vertigo: Patient reports in May 2021 she had an event where she had a syncopal episode.  She reports she had no symptoms prior to passing out other than becoming very dizzy and then blacked out.  She states it was a an extremely hot day and they were looking for a new home in the area.  She felt extremely hot and tried to walk outside to get some air, and then was found passed out.  She states she was only passed out for less than 2 minutes.  She does think she hit her head on the sidewalk on the way down.  She was taken to the emergency room with a negative work-up.  She states she did hit her head during that time.  Since then she has had some room spinning vertigo when looking towards the left and sitting forward.  She had cardiac  echo and event monitoring for work-up for her her syncope without positive findings.  She has no recurrent events.   Event monitoring 01/10/2019 -Predominant normal sinus rhythm.  The heart rate ranged from 54 to 138 bpm and the average heart rate was 75 bpm. There was no atrial fibrillation. There was rare ectopic beats. There was one 5 beat episode of nonsustained ventricular tachycardia. No bradycardia arrhythmias and no pauses. Symptoms correlate with normal sinus rhythm without ectopy.  -Transthoracic echo 11/26/2018: Left ventricle: Cavity size is normal.  Wall thickness is normal.  Systolic function is normal with an estimated EF of 60-65%. Left atrium: Volume index is normal. Right atrium: Normal in size Right ventricle: Cavity size appears normal.  Systolic function is normal.   Depression screen [facility] 2/9 07/30/2021 09/02/2020 03/30/2020 08/23/2019 02/20/2019  Decreased Interest 0 0 0 0 0  Down, Depressed, Hopeless 0 0 0 0 0  PHQ - 2 Score 0 0 0 0 0  Altered sleeping 1 - - - -  Tired, decreased energy 0 - - - -  Change in appetite 2 - - - -  Feeling bad or failure about yourself  0 - - - -  Trouble concentrating 1 - - - -  Moving slowly or fidgety/restless 0 - - - -  Suicidal thoughts 0 - - - -  PHQ-9 Score 4 - - - -   GAD 7 : Generalized Anxiety Score 07/30/2021 08/23/2019 02/20/2019  Nervous, Anxious, on Edge 0 0 1  Control/stop worrying 0 0 1  Worry too much - different things 0 0 2  Trouble relaxing 0 0 0  Restless 0 0 0  Easily annoyed or irritable 0 0 0  Afraid - awful might happen 0 0 1  Total GAD 7 Score 0 0 5  Anxiety Difficulty - Not difficult at all Not difficult at all          Fall Risk  07/30/2021 09/02/2020 03/30/2020  Falls in the past year? 0 0 0  Number falls in past yr: 0 0 0  Injury with Fall? 0 0 0  Risk for fall due to : No Fall Risks - -  Follow up Falls evaluation completed Falls prevention discussed -     Immunization History  Administered  Date(s) Administered   PFIZER(Purple Top)SARS-COV-2 Vaccination 06/21/2019, 07/11/2019, 04/15/2020   Zoster Recombinat (Shingrix) 03/26/2013    No results found.  Past Medical History:  Diagnosis Date   Anhedonia    Anxiety    Arteriosclerosis of carotid artery 03/19/2019   Less than 50%   Chicken pox    Hematuria 12/19/2017   History of fainting spells of unknown cause    Hyperlipidemia    Hypertension    Memory loss    prioor PCP records indicate neuro referral was made   Menopause    Osteoporosis    Rotator cuff (capsule) sprain    R>L   Syncope 09/2018   Vitamin D deficiency    Allergies  Allergen Reactions   Lisinopril Cough   Past Surgical History:  Procedure Laterality Date   CESAREAN SECTION  1980   ELECTROCARDIOGRAM  10/24/2018   SR. HR74, PR 167, QTC 406, NL-EKG   event monitor  01/10/2019   Predominant normal sinus rhythm.  HR 54-1 38.  Average HR 75.  No atrial fib.  Rare ectopic beats.  5 beat episode of nonsustained ventricular tachycardia x1.  No bradycardia arrhythmias.  No pauses.  Normal sinus rhythm without ectopy.   Image: CT chest  10/25/2018   normal- r/o PE   TRANSTHORACIC ECHOCARDIOGRAM  11/26/2018   EF 60-65%.  Normal study.  Completed presyncope.   US CAROTID DOPPLER BILATERAL ([facility] HX)  10/24/2018   Arteriolosclerosis.  Findings consistent with less than 50% stenosis.  Bilateral vertebral blood flow demonstrated.   Family History  Problem Relation Age of Onset   Alzheimer's disease Mother        6: 8 children on her mother's side have Alzheimer's.   Bone cancer Father    Early death Brother    Early death Maternal Grandfather    Heart attack Maternal Grandfather    Social History   Social History Narrative   Marital status/children/pets: married, 2 children.    Education/employment: retired   Field seismologistafety:      -smoke alarm in the home:Yes     - wears seatbelt: Yes     - Feels safe in their relationships: Yes    Allergies as of  07/30/2021       Reactions   Lisinopril Cough        Medication List        Accurate as of July 30, 2021  1:22 PM. If you have any questions, ask your nurse or doctor.  alendronate 70 MG tablet Commonly known as: FOSAMAX Take 1 tablet (70 mg total) by mouth every 7 (seven) days. Take with a full glass of water on an empty stomach. Must remain upright for 30 minutes   amLODipine 2.5 MG tablet Commonly known as: NORVASC TAKE 1 TABLET(2.5 MG) BY MOUTH DAILY   buPROPion 300 MG 24 hr tablet Commonly known as: WELLBUTRIN XL TAKE 1 TABLET(300 MG) BY MOUTH DAILY   cetirizine 10 MG tablet Commonly known as: ZYRTEC Take 1 tablet (10 mg total) by mouth at bedtime.   diclofenac 75 MG EC tablet Commonly known as: VOLTAREN Take 1 tablet (75 mg total) by mouth 2 (two) times daily.   fluticasone 50 MCG/ACT nasal spray Commonly known as: FLONASE Place 2 sprays into both nostrils daily.   rosuvastatin 20 MG tablet Commonly known as: CRESTOR TAKE 1 TABLET(10 MG) BY MOUTH AT BEDTIME   triamterene-hydrochlorothiazide 37.5-25 MG tablet Commonly known as: MAXZIDE-25 Take 1 tablet by mouth daily.        All past medical history, surgical history, allergies, family history, immunizations andmedications were updated in the EMR today and reviewed under the history and medication portions of their EMR.    No results found for this or any previous visit (from the past 2160 hour(s)).  Patient was never admitted.   ROS: 14 pt review of systems performed and negative (unless mentioned in an HPI)  Objective: BP 106/73    Pulse 71    Temp 98.6 F (37 C) (Oral)    Ht 5\' 2"  (1.575 m)    Wt 142 lb (64.4 kg)    SpO2 98%    BMI 25.97 kg/m  Physical Exam Vitals and nursing note reviewed.  Constitutional:      General: She is not in acute distress.    Appearance: Normal appearance. She is not ill-appearing, toxic-appearing or diaphoretic.  HENT:     Head: Normocephalic and  atraumatic.     Mouth/Throat:     Mouth: Mucous membranes are moist.  Eyes:     General: No scleral icterus.       Right eye: No discharge.        Left eye: No discharge.     Extraocular Movements: Extraocular movements intact.     Conjunctiva/sclera: Conjunctivae normal.     Pupils: Pupils are equal, round, and reactive to light.  Cardiovascular:     Rate and Rhythm: Normal rate and regular rhythm.  Pulmonary:     Effort: Pulmonary effort is normal. No respiratory distress.     Breath sounds: Normal breath sounds. No wheezing, rhonchi or rales.  Musculoskeletal:     Cervical back: Neck supple. No tenderness.     Right lower leg: No edema.     Left lower leg: No edema.  Lymphadenopathy:     Cervical: No cervical adenopathy.  Skin:    General: Skin is warm and dry.     Coloration: Skin is not jaundiced or pale.     Findings: No erythema or rash.  Neurological:     Mental Status: She is alert and oriented to person, place, and time. Mental status is at baseline.     Motor: No weakness.     Gait: Gait normal.  Psychiatric:        Mood and Affect: Mood normal.        Behavior: Behavior normal.        Thought Content: Thought content normal.  Judgment: Judgment normal.    Assessment/plan: Denise Rios is a 71 y.o. female present for [facility] Essential hypertension/hyperlipidemia/overweight Stable Continue Maxide 1 tab daily Amlodipine 2.5 mg qd Continue  Crestor: Goal : < 135/85.  If above goal she will make an appointment to follow-up. -Low-sodium diet and exercise encouraged. -Follow-up 5.5 months  Anxiousness: - stable.  Continue Wellbutrin 300 mg QD  Benign paroxysmal positional vertigo, unspecified laterality Patient having vertigo symptoms with laying back or sitting up after laying down and looking towards the left.  Possibly secondary to her syncopal episode and hitting her head.  Discussed further work-up with neurology versus CT of her head.  Also  considered vestibular rehab.  She would like to monitor for now.  -Discussed adequate hydration can play a role in BPPV -Patient was given instructions on the Epley maneuver. -Follow-up if desires further work-up.  Lumbar pain Stable.  - diclofenac (VOLTAREN) 75 MG EC tablet; Take 1 tablet (75 mg total) by mouth 2 (two) times daily.  Dispense: 60 tablet; Refill: 5     Note is dictated utilizing voice recognition software. Although note has been proof read prior to signing, occasional typographical errors still can be missed. If any questions arise, please do not hesitate to call for verification.  Electronically signed by: Felix Pacini, DO [facility] [place]

## 2021-09-08 ENCOUNTER — Ambulatory Visit (INDEPENDENT_AMBULATORY_CARE_PROVIDER_SITE_OTHER): Payer: Medicare Other

## 2021-09-08 ENCOUNTER — Ambulatory Visit: Payer: Medicare Other

## 2021-09-08 DIAGNOSIS — Z0000 Encounter for general adult medical examination without abnormal findings: Secondary | ICD-10-CM

## 2021-09-08 NOTE — Patient Instructions (Signed)
Denise Rios , ?Thank you for taking time to come for your Medicare Wellness Visit. I appreciate your ongoing commitment to your health goals. Please review the following plan we discussed and let me know if I can assist you in the future.  ? ?Screening recommendations/referrals: ?Colonoscopy: Done 05/30/14 repeat every 10 years  ?Mammogram: Done 03/29/21 repeat every year  ?Bone Density: Done 03/29/21 repeat every 3 years  ?Recommended yearly ophthalmology/optometry visit for glaucoma screening and checkup ?Recommended yearly dental visit for hygiene and checkup ? ?Vaccinations: ?Influenza vaccine: Declined  ?Pneumococcal vaccine: declined and discussed  ?Tdap vaccine: Due and discussed  ?Shingles vaccine: 1st dose 03/26/13    ?Covid-19:Completed 1/22, 2/11, 04/15/20  ? ?Advanced directives: Please bring a copy of your health care power of attorney and living will to the office at your convenience. ? ?Conditions/risks identified: None at this time  ? ?Next appointment: Follow up in one year for your annual wellness visit  ? ? ?Preventive Care 69 Years and Older, Female ?Preventive care refers to lifestyle choices and visits with your health care provider that can promote health and wellness. ?What does preventive care include? ?A yearly physical exam. This is also called an annual well check. ?Dental exams once or twice a year. ?Routine eye exams. Ask your health care provider how often you should have your eyes checked. ?Personal lifestyle choices, including: ?Daily care of your teeth and gums. ?Regular physical activity. ?Eating a healthy diet. ?Avoiding tobacco and drug use. ?Limiting alcohol use. ?Practicing safe sex. ?Taking low-dose aspirin every day. ?Taking vitamin and mineral supplements as recommended by your health care provider. ?What happens during an annual well check? ?The services and screenings done by your health care provider during your annual well check will depend on your age, overall health,  lifestyle risk factors, and family history of disease. ?Counseling  ?Your health care provider may ask you questions about your: ?Alcohol use. ?Tobacco use. ?Drug use. ?Emotional well-being. ?Home and relationship well-being. ?Sexual activity. ?Eating habits. ?History of falls. ?Memory and ability to understand (cognition). ?Work and work Astronomer. ?Reproductive health. ?Screening  ?You may have the following tests or measurements: ?Height, weight, and BMI. ?Blood pressure. ?Lipid and cholesterol levels. These may be checked every 5 years, or more frequently if you are over 75 years old. ?Skin check. ?Lung cancer screening. You may have this screening every year starting at age 21 if you have a 30-pack-year history of smoking and currently smoke or have quit within the past 15 years. ?Fecal occult blood test (FOBT) of the stool. You may have this test every year starting at age 38. ?Flexible sigmoidoscopy or colonoscopy. You may have a sigmoidoscopy every 5 years or a colonoscopy every 10 years starting at age 26. ?Hepatitis C blood test. ?Hepatitis B blood test. ?Sexually transmitted disease (STD) testing. ?Diabetes screening. This is done by checking your blood sugar (glucose) after you have not eaten for a while (fasting). You may have this done every 1-3 years. ?Bone density scan. This is done to screen for osteoporosis. You may have this done starting at age 22. ?Mammogram. This may be done every 1-2 years. Talk to your health care provider about how often you should have regular mammograms. ?Talk with your health care provider about your test results, treatment options, and if necessary, the need for more tests. ?Vaccines  ?Your health care provider may recommend certain vaccines, such as: ?Influenza vaccine. This is recommended every year. ?Tetanus, diphtheria, and acellular pertussis (Tdap, Td)  vaccine. You may need a Td booster every 10 years. ?Zoster vaccine. You may need this after age  19. ?Pneumococcal 13-valent conjugate (PCV13) vaccine. One dose is recommended after age 41. ?Pneumococcal polysaccharide (PPSV23) vaccine. One dose is recommended after age 42. ?Talk to your health care provider about which screenings and vaccines you need and how often you need them. ?This information is not intended to replace advice given to you by your health care provider. Make sure you discuss any questions you have with your health care provider. ?Document Released: 06/12/2015 Document Revised: 02/03/2016 Document Reviewed: 03/17/2015 ?Elsevier Interactive Patient Education ? 2017 [place]. ? ?Fall Prevention in the Home ?Falls can cause injuries. They can happen to people of all ages. There are many things you can do to make your home safe and to help prevent falls. ?What can I do on the outside of my home? ?Regularly fix the edges of walkways and driveways and fix any cracks. ?Remove anything that might make you trip as you walk through a door, such as a raised step or threshold. ?Trim any bushes or trees on the path to your home. ?Use bright outdoor lighting. ?Clear any walking paths of anything that might make someone trip, such as rocks or tools. ?Regularly check to see if handrails are loose or broken. Make sure that both sides of any steps have handrails. ?Any raised decks and porches should have guardrails on the edges. ?Have any leaves, snow, or ice cleared regularly. ?Use sand or salt on walking paths during winter. ?Clean up any spills in your garage right away. This includes oil or grease spills. ?What can I do in the bathroom? ?Use night lights. ?Install grab bars by the toilet and in the tub and shower. Do not use towel bars as grab bars. ?Use non-skid mats or decals in the tub or shower. ?If you need to sit down in the shower, use a plastic, non-slip stool. ?Keep the floor dry. Clean up any water that spills on the floor as soon as it happens. ?Remove soap buildup in the tub or shower  regularly. ?Attach bath mats securely with double-sided non-slip rug tape. ?Do not have throw rugs and other things on the floor that can make you trip. ?What can I do in the bedroom? ?Use night lights. ?Make sure that you have a light by your bed that is easy to reach. ?Do not use any sheets or blankets that are too big for your bed. They should not hang down onto the floor. ?Have a firm chair that has side arms. You can use this for support while you get dressed. ?Do not have throw rugs and other things on the floor that can make you trip. ?What can I do in the kitchen? ?Clean up any spills right away. ?Avoid walking on wet floors. ?Keep items that you use a lot in easy-to-reach places. ?If you need to reach something above you, use a strong step stool that has a grab bar. ?Keep electrical cords out of the way. ?Do not use floor polish or wax that makes floors slippery. If you must use wax, use non-skid floor wax. ?Do not have throw rugs and other things on the floor that can make you trip. ?What can I do with my stairs? ?Do not leave any items on the stairs. ?Make sure that there are handrails on both sides of the stairs and use them. Fix handrails that are broken or loose. Make sure that handrails are as long  as the stairways. ?Check any carpeting to make sure that it is firmly attached to the stairs. Fix any carpet that is loose or worn. ?Avoid having throw rugs at the top or bottom of the stairs. If you do have throw rugs, attach them to the floor with carpet tape. ?Make sure that you have a light switch at the top of the stairs and the bottom of the stairs. If you do not have them, ask someone to add them for you. ?What else can I do to help prevent falls? ?Wear shoes that: ?Do not have high heels. ?Have rubber bottoms. ?Are comfortable and fit you well. ?Are closed at the toe. Do not wear sandals. ?If you use a stepladder: ?Make sure that it is fully opened. Do not climb a closed stepladder. ?Make sure that  both sides of the stepladder are locked into place. ?Ask someone to hold it for you, if possible. ?Clearly mark and make sure that you can see: ?Any grab bars or handrails. ?First and last steps. ?Where the edge of each step i

## 2021-09-08 NOTE — Progress Notes (Addendum)
Virtual Visit via Telephone Note ? ?I connected with  Denise Rios on 09/08/21 at  1:00 PM EDT by telephone and verified that I am speaking with the correct person using two identifiers. ? ?Medicare Annual Wellness visit completed telephonically due to Covid-19 pandemic.  ? ?Persons participating in this call: This Health Coach and this patient.  ? ?Location: ?Patient: Home  ?Provider: Office ?  ?I discussed the limitations, risks, security and privacy concerns of performing an evaluation and management service by telephone and the availability of in person appointments. The patient expressed understanding and agreed to proceed. ? ?Unable to perform video visit due to video visit attempted and failed and/or patient does not have video capability.  ? ?Some vital signs may be absent or patient reported.  ? ?Marzella Schleinina H Nasif Bos, LPN ? ? ?Subjective:  ? Denise Mediciatricia A Rios is a 71 y.o. female who presents for Medicare Annual (Subsequent) preventive examination. ? ?Review of Systems    ? ?Cardiac Risk Factors include: advanced age (>2755men, 18>65 women);hypertension;dyslipidemia ? ?   ?Objective:  ?  ?There were no vitals filed for this visit. ?There is no height or weight on file to calculate BMI. ? ? ?  09/08/2021  ?  1:10 PM 09/02/2020  ?  1:33 PM 04/13/2020  ?  1:43 PM  ?Advanced Directives  ?Does Patient Have a Medical Advance Directive? Yes No No  ?Type of Estate agentAdvance Directive Healthcare Power of Attorney    ?Copy of Healthcare Power of Attorney in Chart? No - copy requested    ?Would patient like information on creating a medical advance directive?  Yes (MAU/Ambulatory/Procedural Areas - Information given) No - Patient declined  ? ? ?Current Medications (verified) ?Outpatient Encounter Medications as of 09/08/2021  ?Medication Sig  ? alendronate (FOSAMAX) 70 MG tablet Take 1 tablet (70 mg total) by mouth every 7 (seven) days. Take with a full glass of water on an empty stomach. Must remain upright for 30 minutes  ? amLODipine  (NORVASC) 2.5 MG tablet TAKE 1 TABLET(2.5 MG) BY MOUTH DAILY  ? buPROPion (WELLBUTRIN XL) 300 MG 24 hr tablet TAKE 1 TABLET(300 MG) BY MOUTH DAILY  ? diclofenac (VOLTAREN) 75 MG EC tablet Take 1 tablet (75 mg total) by mouth 2 (two) times daily.  ? fluticasone (FLONASE) 50 MCG/ACT nasal spray Place 2 sprays into both nostrils daily.  ? rosuvastatin (CRESTOR) 20 MG tablet TAKE 1 TABLET(10 MG) BY MOUTH AT BEDTIME  ? triamterene-hydrochlorothiazide (MAXZIDE-25) 37.5-25 MG tablet Take 1 tablet by mouth daily.  ? [DISCONTINUED] cetirizine (ZYRTEC) 10 MG tablet Take 1 tablet (10 mg total) by mouth at bedtime.  ? ?No facility-administered encounter medications on file as of 09/08/2021.  ? ? ?Allergies (verified) ?Lisinopril  ? ?History: ?Past Medical History:  ?Diagnosis Date  ? Anhedonia   ? Anxiety   ? Arteriosclerosis of carotid artery 03/19/2019  ? Less than 50%  ? Chicken pox   ? Hematuria 12/19/2017  ? History of fainting spells of unknown cause   ? Hyperlipidemia   ? Hypertension   ? Memory loss   ? prioor PCP records indicate neuro referral was made  ? Menopause   ? Osteoporosis   ? Rotator cuff (capsule) sprain   ? R>L  ? Syncope 09/2018  ? Vitamin D deficiency   ? ?Past Surgical History:  ?Procedure Laterality Date  ? CESAREAN SECTION  1980  ? ELECTROCARDIOGRAM  10/24/2018  ? SR. HR74, PR 167, QTC 406, NL-EKG  ? event  monitor  01/10/2019  ? Predominant normal sinus rhythm.  HR 54-1 38.  Average HR 75.  No atrial fib.  Rare ectopic beats.  5 beat episode of nonsustained ventricular tachycardia x1.  No bradycardia arrhythmias.  No pauses.  Normal sinus rhythm without ectopy.  ? Image: CT chest  10/25/2018  ? normal- r/o PE  ? TRANSTHORACIC ECHOCARDIOGRAM  11/26/2018  ? EF 60-65%.  Normal study.  Completed presyncope.  ? US CAROTID DOPPLER BILATERAL ([facility] HX)  10/24/2018  ? Arteriolosclerosis.  Findings consistent with less than 50% stenosis.  Bilateral vertebral blood flow demonstrated.  ? ?Family History  ?Problem  Relation Age of Onset  ? Alzheimer's disease Mother   ?     6: 8 children on her mother's side have Alzheimer's.  ? Bone cancer Father   ? Early death Brother   ? Early death Maternal Grandfather   ? Heart attack Maternal Grandfather   ? ?Social History  ? ?Socioeconomic History  ? Marital status: Married  ?  Spouse name: Not on file  ? Number of children: Not on file  ? Years of education: Not on file  ? Highest education level: Not on file  ?Occupational History  ? Occupation: retired  ?Tobacco Use  ? Smoking status: Never  ? Smokeless tobacco: Never  ?Vaping Use  ? Vaping Use: Never used  ?Substance and Sexual Activity  ? Alcohol use: Yes  ? Drug use: Never  ? Sexual activity: Not Currently  ?  Partners: Male  ?Other Topics Concern  ? Not on file  ?Social History Narrative  ? Marital status/children/pets: married, 2 children.   ? Education/employment: retired  ? Safety:   ?   -smoke alarm in the home:Yes  ?   - wears seatbelt: Yes  ?   - Feels safe in their relationships: Yes  ? ?Social Determinants of Health  ? ?Financial Resource Strain: Low Risk   ? Difficulty of Paying Living Expenses: Not hard at all  ?Food Insecurity: No Food Insecurity  ? Worried About Programme researcher, broadcasting/film/video in the Last Year: Never true  ? Ran Out of Food in the Last Year: Never true  ?Transportation Needs: No Transportation Needs  ? Lack of Transportation (Medical): No  ? Lack of Transportation (Non-Medical): No  ?Physical Activity: Inactive  ? Days of Exercise per Week: 0 days  ? Minutes of Exercise per Session: 0 min  ?Stress: No Stress Concern Present  ? Feeling of Stress : Not at all  ?Social Connections: Moderately Integrated  ? Frequency of Communication with Friends and Family: More than three times a week  ? Frequency of Social Gatherings with Friends and Family: Not on file  ? Attends Religious Services: More than 4 times per year  ? Active Member of Clubs or Organizations: No  ? Attends Banker Meetings: Never  ?  Marital Status: Married  ? ? ?Tobacco Counseling ?Counseling given: Not Answered ? ? ?Clinical Intake: ? ?Pre-visit preparation completed: Yes ? ?Pain : No/denies pain ? ?  ? ?BMI - recorded: 25.97 ?Nutritional Status: BMI 25 -29 Overweight ?Nutritional Risks: None ? ?  ? ?Diabetic?no ? ?Interpreter Needed?: No ? ?Information entered by :: Lanier Ensign, LPN ? ? ?Activities of Daily Living ? ?  09/08/2021  ?  1:12 PM  ?In your present state of health, do you have any difficulty performing the following activities:  ?Hearing? 1  ?Comment slight loss  ?Vision? 0  ?Difficulty concentrating or  making decisions? 0  ?Walking or climbing stairs? 0  ?Dressing or bathing? 0  ?Doing errands, shopping? 0  ?Preparing Food and eating ? N  ?Using the Toilet? N  ?In the past six months, have you accidently leaked urine? N  ?Do you have problems with loss of bowel control? N  ?Managing your Medications? N  ?Managing your Finances? N  ?Housekeeping or managing your Housekeeping? N  ? ? ?Patient Care Team: ?Natalia Leatherwood, DO as PCP - General (Family Medicine) ? ?Indicate any recent Medical Services you may have received from other than [facility] providers in the past year (date may be approximate). ? ?   ?Assessment:  ? This is a routine wellness examination for Denise Rios. ? ?Hearing/Vision screen ?Hearing Screening - Comments:: Pt stated maybe slight hearing loss  ?Vision Screening - Comments:: Pt follows up with Dr sellers for annual eye exams  ? ?Dietary issues and exercise activities discussed: ?Current Exercise Habits: The patient does not participate in regular exercise at present ? ? Goals Addressed   ? ?  ?  ?  ?  ? This Visit's Progress  ?  Patient Stated     ?  None at this time ?  ? ?  ? ?Depression Screen ? ?  09/08/2021  ?  1:09 PM 07/30/2021  ?  1:10 PM 09/02/2020  ?  1:37 PM 03/30/2020  ?  1:02 PM 08/23/2019  ?  2:38 PM 02/20/2019  ?  1:12 PM  ?PHQ 2/9 Scores  ?PHQ - 2 Score 0 0 0 0 0 0  ?PHQ- 9 Score  4      ?  ?Fall Risk ? ?   09/08/2021  ?  1:11 PM 07/30/2021  ?  1:03 PM 09/02/2020  ?  1:37 PM 03/30/2020  ?  1:02 PM  ?Fall Risk   ?Falls in the past year? 0 0 0 0  ?Number falls in past yr: 0 0 0 0  ?Injury with Fall? 0 0 0 0  ?Risk for f

## 2021-11-12 ENCOUNTER — Other Ambulatory Visit: Payer: Self-pay | Admitting: Family Medicine

## 2022-01-14 ENCOUNTER — Ambulatory Visit (INDEPENDENT_AMBULATORY_CARE_PROVIDER_SITE_OTHER): Payer: Medicare Other | Admitting: Family Medicine

## 2022-01-14 ENCOUNTER — Encounter: Payer: Self-pay | Admitting: Family Medicine

## 2022-01-14 VITALS — BP 131/80 | HR 77 | Temp 98.6°F | Ht 62.0 in | Wt 142.0 lb

## 2022-01-14 DIAGNOSIS — M949 Disorder of cartilage, unspecified: Secondary | ICD-10-CM

## 2022-01-14 DIAGNOSIS — I10 Essential (primary) hypertension: Secondary | ICD-10-CM

## 2022-01-14 DIAGNOSIS — E782 Mixed hyperlipidemia: Secondary | ICD-10-CM | POA: Diagnosis not present

## 2022-01-14 DIAGNOSIS — M545 Low back pain, unspecified: Secondary | ICD-10-CM | POA: Diagnosis not present

## 2022-01-14 DIAGNOSIS — M810 Age-related osteoporosis without current pathological fracture: Secondary | ICD-10-CM | POA: Diagnosis not present

## 2022-01-14 DIAGNOSIS — M899 Disorder of bone, unspecified: Secondary | ICD-10-CM | POA: Diagnosis not present

## 2022-01-14 DIAGNOSIS — F419 Anxiety disorder, unspecified: Secondary | ICD-10-CM

## 2022-01-14 LAB — COMPREHENSIVE METABOLIC PANEL
ALT: 18 U/L (ref 0–35)
AST: 21 U/L (ref 0–37)
Albumin: 4.3 g/dL (ref 3.5–5.2)
Alkaline Phosphatase: 36 U/L — ABNORMAL LOW (ref 39–117)
BUN: 14 mg/dL (ref 6–23)
CO2: 28 mEq/L (ref 19–32)
Calcium: 9.4 mg/dL (ref 8.4–10.5)
Chloride: 102 mEq/L (ref 96–112)
Creatinine, Ser: 0.89 mg/dL (ref 0.40–1.20)
GFR: 66.19 mL/min (ref >60.00)
Glucose, Bld: 86 mg/dL (ref 70–99)
Potassium: 4.2 mEq/L (ref 3.5–5.1)
Sodium: 137 mEq/L (ref 135–145)
Total Bilirubin: 0.4 mg/dL (ref 0.2–1.2)
Total Protein: 6.5 g/dL (ref 6.0–8.3)

## 2022-01-14 LAB — CBC
HCT: 37.9 % (ref 36.0–46.0)
Hemoglobin: 12.8 g/dL (ref 12.0–15.0)
MCHC: 33.7 g/dL (ref 30.0–36.0)
MCV: 87.7 fl (ref 78.0–100.0)
Platelets: 237 10*3/uL (ref 150.0–400.0)
RBC: 4.32 Mil/uL (ref 3.87–5.11)
RDW: 14.5 % (ref 11.5–15.5)
WBC: 4.7 10*3/uL (ref 4.0–10.5)

## 2022-01-14 LAB — TSH: TSH: 1.26 u[IU]/mL (ref 0.35–5.50)

## 2022-01-14 LAB — LIPID PANEL
Cholesterol: 196 mg/dL (ref 0–200)
HDL: 77.8 mg/dL (ref >39.00)
LDL Cholesterol: 105 mg/dL — ABNORMAL HIGH (ref 0–99)
NonHDL: 117.72
Total CHOL/HDL Ratio: 3
Triglycerides: 65 mg/dL (ref 0.0–149.0)
VLDL: 13 mg/dL (ref 0.0–40.0)

## 2022-01-14 LAB — VITAMIN D 25 HYDROXY (VIT D DEFICIENCY, FRACTURES): VITD: 26.19 ng/mL — ABNORMAL LOW (ref 30.00–100.00)

## 2022-01-14 MED ORDER — TRIAMTERENE-HCTZ 37.5-25 MG PO TABS
1.0000 | ORAL_TABLET | Freq: Every day | ORAL | 1 refills | Status: DC
Start: 2022-01-14 — End: 2022-06-30

## 2022-01-14 MED ORDER — ROSUVASTATIN CALCIUM 20 MG PO TABS
ORAL_TABLET | ORAL | 3 refills | Status: DC
Start: 2022-01-14 — End: 2022-12-21

## 2022-01-14 MED ORDER — BUPROPION HCL ER (XL) 300 MG PO TB24: ORAL_TABLET | ORAL | 1 refills | Status: DC

## 2022-01-14 MED ORDER — FLUTICASONE PROPIONATE 50 MCG/ACT NA SUSP: 2.0000 | Freq: Every day | NASAL | 6 refills | Status: DC

## 2022-01-14 MED ORDER — ALENDRONATE SODIUM 70 MG PO TABS: 70.0000 mg | ORAL_TABLET | ORAL | 3 refills | Status: DC

## 2022-01-14 MED ORDER — AMLODIPINE BESYLATE 2.5 MG PO TABS: ORAL_TABLET | ORAL | 1 refills | Status: DC

## 2022-01-14 MED ORDER — DICLOFENAC SODIUM 75 MG PO TBEC: 75.0000 mg | DELAYED_RELEASE_TABLET | Freq: Two times a day (BID) | ORAL | 5 refills | Status: DC

## 2022-01-14 NOTE — Progress Notes (Signed)
Patient ID: Denise Rios, female  DOB: 11-09-1950, 71 y.o.   MRN: [id number] Patient Care Team    Relationship Specialty Notifications Start End  Ma Hillock, DO PCP - General Family Medicine  02/20/19     Chief Complaint  Patient presents with   Hypertension    Cmc; pt is not fasting   Subjective: Denise Rios is a 71 y.o.  female present for [facility] Anxiety: Patient reports she started to have anxiety 2/2 to dwelling on dementia about 2020.  She has a strong family history of dementia on her mother side.  6 of 8 children on her mother side have been diagnosed with Alzheimer's.  Her mother died in her 52s with Alzheimer's.  She presented to her PCP with these concerns and she was started on Wellbutrin to help her with her anxiety and focus.  She feels that it is working very well  at Wellbutrin 300 mg daily.  Hypertension/HLD/overweight: Pt reports compliance with Maxide. Blood pressures ranges at home not routinely checked. Patient denies chest pain, shortness of breath, dizziness or lower extremity edema.  She is now routinely taking crestor for her cholesterol  Syncope/vertigo:  No current episodes.  Prior note: Patient reports in May 2021 she had an event where she had a syncopal episode.  She reports she had no symptoms prior to passing out other than becoming very dizzy and then blacked out.  She states it was a an extremely hot day and they were looking for a new home in the area.  She felt extremely hot and tried to walk outside to get some air, and then was found passed out.  She states she was only passed out for less than 2 minutes.  She does think she hit her head on the sidewalk on the way down.  She was taken to the emergency room with a negative work-up.  She states she did hit her head during that time.  Since then she has had some room spinning vertigo when looking towards the left and sitting forward.  She had cardiac echo and event monitoring for work-up for her  her syncope without positive findings.   Event monitoring 01/10/2019 -Predominant normal sinus rhythm.  The heart rate ranged from 54 to 138 bpm and the average heart rate was 75 bpm. There was no atrial fibrillation. There was rare ectopic beats. There was one 5 beat episode of nonsustained ventricular tachycardia. No bradycardia arrhythmias and no pauses. Symptoms correlate with normal sinus rhythm without ectopy.  -Transthoracic echo 11/26/2018: Left ventricle: Cavity size is normal.  Wall thickness is normal.  Systolic function is normal with an estimated EF of 60-65%. Left atrium: Volume index is normal. Right atrium: Normal in size Right ventricle: Cavity size appears normal.  Systolic function is normal.      01/14/2022    9:45 AM 09/08/2021    1:09 PM 07/30/2021    1:10 PM 09/02/2020    1:37 PM 03/30/2020    1:02 PM  Depression screen PHQ 2/9  Decreased Interest 0 0 0 0 0  Down, Depressed, Hopeless 0 0 0 0 0  PHQ - 2 Score 0 0 0 0 0  Altered sleeping 2  1    Tired, decreased energy 0  0    Change in appetite 0  2    Feeling bad or failure about yourself  1  0    Trouble concentrating 0  1    Moving  slowly or fidgety/restless 0  0    Suicidal thoughts 0  0    PHQ-9 Score 3  4        01/14/2022    9:45 AM 07/30/2021    1:10 PM 08/23/2019    2:41 PM 02/20/2019    1:13 PM  GAD 7 : Generalized Anxiety Score  Nervous, Anxious, on Edge 0 0 0 1  Control/stop worrying 0 0 0 1  Worry too much - different things 0 0 0 2  Trouble relaxing 1 0 0 0  Restless 0 0 0 0  Easily annoyed or irritable 0 0 0 0  Afraid - awful might happen 1 0 0 1  Total GAD 7 Score 2 0 0 5  Anxiety Difficulty   Not difficult at all Not difficult at all             01/13/2022    9:03 PM 09/08/2021    1:11 PM 07/30/2021    1:03 PM 09/02/2020    1:37 PM 03/30/2020    1:02 PM  [place] in the past year? 0 0 0 0 0  Number falls in past yr:  0 0 0 0  Injury with Fall?  0 0 0 0  Risk for fall due  to :  Impaired vision No Fall Risks    Follow up  Falls prevention discussed Falls evaluation completed Falls prevention discussed      Immunization History  Administered Date(s) Administered   PFIZER(Purple Top)SARS-COV-2 Vaccination 06/21/2019, 07/11/2019, 04/15/2020   Zoster Recombinat (Shingrix) 03/26/2013    No results found.  Past Medical History:  Diagnosis Date   Anhedonia    Anxiety    Arteriosclerosis of carotid artery 03/19/2019   Less than 50%   Chicken pox    Hematuria 12/19/2017   History of fainting spells of unknown cause    Hyperlipidemia    Hypertension    Memory loss    prioor PCP records indicate neuro referral was made   Menopause    Osteoporosis    Rotator cuff (capsule) sprain    R>L   Syncope 09/2018   Vitamin D deficiency    Allergies  Allergen Reactions   Lisinopril Cough   Past Surgical History:  Procedure Laterality Date   CESAREAN SECTION  1980   ELECTROCARDIOGRAM  10/24/2018   SR. HR74, PR 167, QTC 406, NL-EKG   event monitor  01/10/2019   Predominant normal sinus rhythm.  HR 54-1 38.  Average HR 75.  No atrial fib.  Rare ectopic beats.  5 beat episode of nonsustained ventricular tachycardia x1.  No bradycardia arrhythmias.  No pauses.  Normal sinus rhythm without ectopy.   Image: CT chest  10/25/2018   normal- r/o PE   TRANSTHORACIC ECHOCARDIOGRAM  11/26/2018   EF 60-65%.  Normal study.  Completed presyncope.   US CAROTID DOPPLER BILATERAL ([place] HX)  10/24/2018   Arteriolosclerosis.  Findings consistent with less than 50% stenosis.  Bilateral vertebral blood flow demonstrated.   Family History  Problem Relation Age of Onset   Alzheimer's disease Mother        6: 8 children on her mother's side have Alzheimer's.   Bone cancer Father    Early death Brother    Early death Maternal Grandfather    Heart attack Maternal Grandfather    Social History   Social History Narrative   Marital status/children/pets: married, 2 children.     Education/employment: retired  Safety:      -smoke alarm in the home:Yes     - wears seatbelt: Yes     - Feels safe in their relationships: Yes    Allergies as of 01/14/2022       Reactions   Lisinopril Cough        Medication List        Accurate as of January 14, 2022  9:59 AM. If you have any questions, ask your nurse or doctor.          alendronate 70 MG tablet Commonly known as: FOSAMAX Take 1 tablet (70 mg total) by mouth every 7 (seven) days. Take with a full glass of water on an empty stomach. Must remain upright for 30 minutes   amLODipine 2.5 MG tablet Commonly known as: NORVASC TAKE 1 TABLET(2.5 MG) BY MOUTH DAILY   buPROPion 300 MG 24 hr tablet Commonly known as: WELLBUTRIN XL TAKE 1 TABLET(300 MG) BY MOUTH DAILY   diclofenac 75 MG EC tablet Commonly known as: VOLTAREN Take 1 tablet (75 mg total) by mouth 2 (two) times daily.   fluticasone 50 MCG/ACT nasal spray Commonly known as: FLONASE Place 2 sprays into both nostrils daily.   rosuvastatin 20 MG tablet Commonly known as: CRESTOR TAKE 1 TABLET(10 MG) BY MOUTH AT BEDTIME   triamterene-hydrochlorothiazide 37.5-25 MG tablet Commonly known as: MAXZIDE-25 Take 1 tablet by mouth daily.        All past medical history, surgical history, allergies, family history, immunizations andmedications were updated in the EMR today and reviewed under the history and medication portions of their EMR.    No results found for this or any previous visit (from the past 2160 hour(s)).  Patient was never admitted.   ROS: 14 pt review of systems performed and negative (unless mentioned in an HPI)  Objective: BP 131/80   Pulse 77   Temp 98.6 F (37 C) (Oral)   Ht '5\' 2"'  (1.575 m)   Wt 142 lb (64.4 kg)   SpO2 99%   BMI 25.97 kg/m  Physical Exam Vitals and nursing note reviewed.  Constitutional:      General: She is not in acute distress.    Appearance: Normal appearance. She is not ill-appearing,  toxic-appearing or diaphoretic.  HENT:     Head: Normocephalic and atraumatic.     Mouth/Throat:     Mouth: Mucous membranes are moist.  Eyes:     General: No scleral icterus.       Right eye: No discharge.        Left eye: No discharge.     Extraocular Movements: Extraocular movements intact.     Conjunctiva/sclera: Conjunctivae normal.     Pupils: Pupils are equal, round, and reactive to light.  Cardiovascular:     Rate and Rhythm: Normal rate and regular rhythm.  Pulmonary:     Effort: Pulmonary effort is normal. No respiratory distress.     Breath sounds: Normal breath sounds. No wheezing, rhonchi or rales.  Musculoskeletal:     Cervical back: Neck supple. No tenderness.     Right lower leg: No edema.     Left lower leg: No edema.  Lymphadenopathy:     Cervical: No cervical adenopathy.  Skin:    General: Skin is warm and dry.     Coloration: Skin is not jaundiced or pale.     Findings: No erythema or rash.  Neurological:     Mental Status: She is alert and oriented to  person, place, and time. Mental status is at baseline.     Motor: No weakness.     Gait: Gait normal.  Psychiatric:        Mood and Affect: Mood normal.        Behavior: Behavior normal.        Thought Content: Thought content normal.        Judgment: Judgment normal.     Assessment/plan: Denise Rios is a 71 y.o. female present for [facility] Essential hypertension/hyperlipidemia/overweight Stable Continue  Maxide 1 tab daily Continue Amlodipine 2.5 mg qd Continue  Crestor: Goal : < 135/85.  If above goal she will make an appointment to follow-up. -Low-sodium diet and exercise encouraged.  Anxiousness: Stable Continue Wellbutrin 300 mg QD  Benign paroxysmal positional vertigo, unspecified laterality Patient having vertigo symptoms with laying back or sitting up after laying down and looking towards the left.  Possibly secondary to her syncopal episode and hitting her head.  Discussed further  work-up with neurology versus CT of her head.  Also considered vestibular rehab.  She would like to monitor for now.  -Discussed adequate hydration can play a role in BPPV -Patient was given instructions on the Epley maneuver. -Follow-up if desires further work-up.  Lumbar pain stable - continue diclofenac (VOLTAREN) 75 MG EC tablet; Take 1 tablet (75 mg total) by mouth 2 (two) times daily.  Dispense: 60 tablet; Refill: 5  Return in about 24 weeks (around 07/01/2022) for Routine chronic condition follow-up.   Meds ordered this encounter  Medications   amLODipine (NORVASC) 2.5 MG tablet    Sig: TAKE 1 TABLET(2.5 MG) BY MOUTH DAILY    Dispense:  90 tablet    Refill:  1   buPROPion (WELLBUTRIN XL) 300 MG 24 hr tablet    Sig: TAKE 1 TABLET(300 MG) BY MOUTH DAILY    Dispense:  90 tablet    Refill:  1   diclofenac (VOLTAREN) 75 MG EC tablet    Sig: Take 1 tablet (75 mg total) by mouth 2 (two) times daily.    Dispense:  60 tablet    Refill:  5   fluticasone (FLONASE) 50 MCG/ACT nasal spray    Sig: Place 2 sprays into both nostrils daily.    Dispense:  16 g    Refill:  6   alendronate (FOSAMAX) 70 MG tablet    Sig: Take 1 tablet (70 mg total) by mouth every 7 (seven) days. Take with a full glass of water on an empty stomach. Must remain upright for 30 minutes    Dispense:  12 tablet    Refill:  3   rosuvastatin (CRESTOR) 20 MG tablet    Sig: TAKE 1 TABLET(10 MG) BY MOUTH AT BEDTIME    Dispense:  90 tablet    Refill:  3   triamterene-hydrochlorothiazide (MAXZIDE-25) 37.5-25 MG tablet    Sig: Take 1 tablet by mouth daily.    Dispense:  90 tablet    Refill:  1   Orders Placed This Encounter  Procedures   CBC   Comp Met (CMET)   TSH   Lipid panel   Vitamin D (25 hydroxy)      Note is dictated utilizing voice recognition software. Although note has been proof read prior to signing, occasional typographical errors still can be missed. If any questions arise, please do not  hesitate to call for verification.  Electronically signed by: Howard Pouch, DO [place]

## 2022-01-14 NOTE — Patient Instructions (Signed)
No follow-ups on file.        Great to see you today.  I have refilled the medication(s) we provide.   If labs were collected, we will inform you of lab results once received either by echart message or telephone call.   - echart message- for normal results that have been seen by the patient already.   - telephone call: abnormal results or if patient has not viewed results in their echart.  

## 2022-02-10 ENCOUNTER — Encounter: Payer: Self-pay | Admitting: Family Medicine

## 2022-02-10 DIAGNOSIS — H9190 Unspecified hearing loss, unspecified ear: Secondary | ICD-10-CM

## 2022-02-10 NOTE — Telephone Encounter (Signed)
Please advise on referral

## 2022-02-11 NOTE — Telephone Encounter (Signed)
Ok to place

## 2022-03-02 ENCOUNTER — Other Ambulatory Visit: Payer: Self-pay

## 2022-04-19 DIAGNOSIS — H903 Sensorineural hearing loss, bilateral: Secondary | ICD-10-CM | POA: Insufficient documentation

## 2022-04-24 ENCOUNTER — Other Ambulatory Visit: Payer: Self-pay | Admitting: Family Medicine

## 2022-07-01 ENCOUNTER — Ambulatory Visit (INDEPENDENT_AMBULATORY_CARE_PROVIDER_SITE_OTHER): Payer: Medicare Other | Admitting: Family Medicine

## 2022-07-01 ENCOUNTER — Encounter: Payer: Self-pay | Admitting: Family Medicine

## 2022-07-01 VITALS — BP 121/76 | HR 64 | Temp 98.8°F | Wt 147.6 lb

## 2022-07-01 DIAGNOSIS — I10 Essential (primary) hypertension: Secondary | ICD-10-CM

## 2022-07-01 DIAGNOSIS — M545 Low back pain, unspecified: Secondary | ICD-10-CM

## 2022-07-01 DIAGNOSIS — E782 Mixed hyperlipidemia: Secondary | ICD-10-CM

## 2022-07-01 DIAGNOSIS — F419 Anxiety disorder, unspecified: Secondary | ICD-10-CM

## 2022-07-01 MED ORDER — TRIAMTERENE-HCTZ 37.5-25 MG PO TABS
1.0000 | ORAL_TABLET | Freq: Every day | ORAL | 1 refills | Status: DC
Start: 2022-07-01 — End: 2022-12-06

## 2022-07-01 MED ORDER — AMLODIPINE BESYLATE 2.5 MG PO TABS: ORAL_TABLET | ORAL | 1 refills | Status: DC

## 2022-07-01 MED ORDER — DICLOFENAC SODIUM 75 MG PO TBEC: 75.0000 mg | DELAYED_RELEASE_TABLET | Freq: Two times a day (BID) | ORAL | 1 refills | Status: DC

## 2022-07-01 MED ORDER — BUPROPION HCL ER (XL) 300 MG PO TB24: ORAL_TABLET | ORAL | 1 refills | Status: DC

## 2022-07-01 NOTE — Progress Notes (Signed)
Patient ID: Denise Rios, female  DOB: 1950-11-22, 72 y.o.   MRN: [id number] Patient Care Team    Relationship Specialty Notifications Start End  Ma Hillock, DO PCP - General Family Medicine  02/20/19     Chief Complaint  Patient presents with   Hypertension   Subjective: Denise Rios is a 72 y.o.  female present for [facility] Anxiety: Patient reports she started to have anxiety 2/2 to dwelling on dementia about 2020.  She has a strong family history of dementia on her mother side.  6 of 8 children on her mother side have been diagnosed with Alzheimer's.  Her mother died in her 66s with Alzheimer's.  She presented to her PCP with these concerns and she was started on Wellbutrin to help her with her anxiety and focus.   She feels the [place] 300 mg daily is working very well for her.    Hypertension/HLD/overweight: Pt reports compliance with amlodipine 2.5 mg daily, Crestor 20 mg daily and with Maxide. Blood pressures ranges at home not routinely checked. Patient denies chest pain, shortness of breath, dizziness or lower extremity edema.  She is now routinely taking crestor for her cholesterol  Syncope/vertigo:  No current episodes.  Prior note: Patient reports in May 2021 she had an event where she had a syncopal episode.  She reports she had no symptoms prior to passing out other than becoming very dizzy and then blacked out.  She states it was a an extremely hot day and they were looking for a new home in the area.  She felt extremely hot and tried to walk outside to get some air, and then was found passed out.  She states she was only passed out for less than 2 minutes.  She does think she hit her head on the sidewalk on the way down.  She was taken to the emergency room with a negative work-up.  She states she did hit her head during that time.  Since then she has had some room spinning vertigo when looking towards the left and sitting forward.  She had cardiac echo and event  monitoring for work-up for her her syncope without positive findings.   Event monitoring 01/10/2019 -Predominant normal sinus rhythm.  The heart rate ranged from 54 to 138 bpm and the average heart rate was 75 bpm. There was no atrial fibrillation. There was rare ectopic beats. There was one 5 beat episode of nonsustained ventricular tachycardia. No bradycardia arrhythmias and no pauses. Symptoms correlate with normal sinus rhythm without ectopy.  -Transthoracic echo 11/26/2018: Left ventricle: Cavity size is normal.  Wall thickness is normal.  Systolic function is normal with an estimated EF of 60-65%. Left atrium: Volume index is normal. Right atrium: Normal in size Right ventricle: Cavity size appears normal.  Systolic function is normal.      01/14/2022    9:45 AM 09/08/2021    1:09 PM 07/30/2021    1:10 PM 09/02/2020    1:37 PM 03/30/2020    1:02 PM  Depression screen PHQ 2/9  Decreased Interest 0 0 0 0 0  Down, Depressed, Hopeless 0 0 0 0 0  PHQ - 2 Score 0 0 0 0 0  Altered sleeping 2  1    Tired, decreased energy 0  0    Change in appetite 0  2    Feeling bad or failure about yourself  1  0    Trouble concentrating 0  1  Moving slowly or fidgety/restless 0  0    Suicidal thoughts 0  0    PHQ-9 Score 3  4        01/14/2022    9:45 AM 07/30/2021    1:10 PM 08/23/2019    2:41 PM 02/20/2019    1:13 PM  GAD 7 : Generalized Anxiety Score  Nervous, Anxious, on Edge 0 0 0 1  Control/stop worrying 0 0 0 1  Worry too much - different things 0 0 0 2  Trouble relaxing 1 0 0 0  Restless 0 0 0 0  Easily annoyed or irritable 0 0 0 0  Afraid - awful might happen 1 0 0 1  Total GAD 7 Score 2 0 0 5  Anxiety Difficulty   Not difficult at all Not difficult at all           01/13/2022    9:03 PM 09/08/2021    1:11 PM 07/30/2021    1:03 PM 09/02/2020    1:37 PM 03/30/2020    1:02 PM  [place] in the past year? 0 0 0 0 0  Number falls in past yr:  0 0 0 0  Injury with Fall?   0 0 0 0  Risk for fall due to :  Impaired vision No Fall Risks    Follow up  Falls prevention discussed Falls evaluation completed Falls prevention discussed      Immunization History  Administered Date(s) Administered   PFIZER(Purple Top)SARS-COV-2 Vaccination 06/21/2019, 07/11/2019, 04/15/2020   Zoster Recombinat (Shingrix) 03/26/2013, 12/28/2021    No results found.  Past Medical History:  Diagnosis Date   Anhedonia    Anxiety    Arteriosclerosis of carotid artery 03/19/2019   Less than 50%   Chicken pox    Hematuria 12/19/2017   History of fainting spells of unknown cause    Hyperlipidemia    Hypertension    Memory loss    prioor PCP records indicate neuro referral was made   Menopause    Osteoporosis    Rotator cuff (capsule) sprain    R>L   Syncope 09/2018   Vitamin D deficiency    Allergies  Allergen Reactions   Lisinopril Cough   Past Surgical History:  Procedure Laterality Date   CESAREAN SECTION  1980   ELECTROCARDIOGRAM  10/24/2018   SR. HR74, PR 167, QTC 406, NL-EKG   event monitor  01/10/2019   Predominant normal sinus rhythm.  HR 54-1 38.  Average HR 75.  No atrial fib.  Rare ectopic beats.  5 beat episode of nonsustained ventricular tachycardia x1.  No bradycardia arrhythmias.  No pauses.  Normal sinus rhythm without ectopy.   Image: CT chest  10/25/2018   normal- r/o PE   TRANSTHORACIC ECHOCARDIOGRAM  11/26/2018   EF 60-65%.  Normal study.  Completed presyncope.   US CAROTID DOPPLER BILATERAL ([place] HX)  10/24/2018   Arteriolosclerosis.  Findings consistent with less than 50% stenosis.  Bilateral vertebral blood flow demonstrated.   Family History  Problem Relation Age of Onset   Alzheimer's disease Mother        6: 8 children on her mother's side have Alzheimer's.   Bone cancer Father    Early death Brother    Early death Maternal Grandfather    Heart attack Maternal Grandfather    Social History   Social History Narrative   Marital  status/children/pets: married, 2 children.    Education/employment: retired  Safety:      -smoke alarm in the home:Yes     - wears seatbelt: Yes     - Feels safe in their relationships: Yes    Allergies as of 07/01/2022       Reactions   Lisinopril Cough        Medication List        Accurate as of July 01, 2022  1:18 PM. If you have any questions, ask your nurse or doctor.          alendronate 70 MG tablet Commonly known as: FOSAMAX Take 1 tablet (70 mg total) by mouth every 7 (seven) days. Take with a full glass of water on an empty stomach. Must remain upright for 30 minutes   amLODipine 2.5 MG tablet Commonly known as: NORVASC TAKE 1 TABLET(2.5 MG) BY MOUTH DAILY   buPROPion 300 MG 24 hr tablet Commonly known as: WELLBUTRIN XL TAKE 1 TABLET(300 MG) BY MOUTH DAILY   diclofenac 75 MG EC tablet Commonly known as: VOLTAREN Take 1 tablet (75 mg total) by mouth 2 (two) times daily.   fluticasone 50 MCG/ACT nasal spray Commonly known as: FLONASE Place 2 sprays into both nostrils daily.   rosuvastatin 20 MG tablet Commonly known as: CRESTOR TAKE 1 TABLET(10 MG) BY MOUTH AT BEDTIME   triamterene-hydrochlorothiazide 37.5-25 MG tablet Commonly known as: MAXZIDE-25 Take 1 tablet by mouth daily.        All past medical history, surgical history, allergies, family history, immunizations andmedications were updated in the EMR today and reviewed under the history and medication portions of their EMR.    No results found for this or any previous visit (from the past 2160 hour(s)).  Patient was never admitted.   ROS: 14 pt review of systems performed and negative (unless mentioned in an HPI)  Objective: BP 121/76   Pulse 64   Temp 98.8 F (37.1 C)   Wt 147 lb 9.6 oz (67 kg)   BMI 27.00 kg/m  Physical Exam Vitals and nursing note reviewed.  Constitutional:      General: She is not in acute distress.    Appearance: Normal appearance. She is not  ill-appearing, toxic-appearing or diaphoretic.  HENT:     Head: Normocephalic and atraumatic.  Eyes:     General: No scleral icterus.       Right eye: No discharge.        Left eye: No discharge.     Extraocular Movements: Extraocular movements intact.     Conjunctiva/sclera: Conjunctivae normal.     Pupils: Pupils are equal, round, and reactive to light.  Cardiovascular:     Rate and Rhythm: Normal rate and regular rhythm.  Pulmonary:     Effort: Pulmonary effort is normal. No respiratory distress.     Breath sounds: Normal breath sounds. No wheezing, rhonchi or rales.  Musculoskeletal:     Right lower leg: No edema.     Left lower leg: No edema.  Skin:    General: Skin is warm.     Findings: No rash.  Neurological:     Mental Status: She is alert and oriented to person, place, and time. Mental status is at baseline.     Motor: No weakness.     Gait: Gait normal.  Psychiatric:        Mood and Affect: Mood normal.        Behavior: Behavior normal.        Thought Content: Thought content normal.  Judgment: Judgment normal.     Assessment/plan: Denise Rios is a 72 y.o. female present for [facility] Essential hypertension/hyperlipidemia/overweight Stable Continue Maxide 1 tab daily Continue amlodipine 2.5 mg qd Continue Crestor: Goal : < 135/85.  If above goal she will make an appointment to follow-up. -Low-sodium diet and exercise encouraged. Labs due next visit  Anxiousness: Stable Continue Wellbutrin 300 mg QD  Lumbar pain Stable Continue diclofenac (VOLTAREN) 75 MG EC tablet; Take 1 tablet (75 mg total) by mouth 2 (two) times daily.  Dispense: 60 tablet; Refill: 5  Return in about 24 weeks (around 12/16/2022).   Meds ordered this encounter  Medications   triamterene-hydrochlorothiazide (MAXZIDE-25) 37.5-25 MG tablet    Sig: Take 1 tablet by mouth daily.    Dispense:  90 tablet    Refill:  1   diclofenac (VOLTAREN) 75 MG EC tablet    Sig: Take 1 tablet  (75 mg total) by mouth 2 (two) times daily.    Dispense:  180 tablet    Refill:  1   buPROPion (WELLBUTRIN XL) 300 MG 24 hr tablet    Sig: TAKE 1 TABLET(300 MG) BY MOUTH DAILY    Dispense:  90 tablet    Refill:  1   amLODipine (NORVASC) 2.5 MG tablet    Sig: TAKE 1 TABLET(2.5 MG) BY MOUTH DAILY    Dispense:  90 tablet    Refill:  1   No orders of the defined types were placed in this encounter.     Note is dictated utilizing voice recognition software. Although note has been proof read prior to signing, occasional typographical errors still can be missed. If any questions arise, please do not hesitate to call for verification.  Electronically signed by: Felix Pacini, DO [facility] [place]

## 2022-07-01 NOTE — Patient Instructions (Addendum)
Return in about 24 weeks (around 12/16/2022).        Great to see you today.  I have refilled the medication(s) we provide.   If labs were collected, we will inform you of lab results once received either by echart message or telephone call.   - echart message- for normal results that have been seen by the patient already.   - telephone call: abnormal results or if patient has not viewed results in their echart.   Labs will be due at the next visit.

## 2022-08-26 IMAGING — MG MM DIGITAL SCREENING BILAT W/ TOMO AND CAD
8 series · 9 of 24 positions shown · non-contrast
Comparison: Previous exam(s).

CLINICAL DATA: Screening.

EXAM:
DIGITAL SCREENING BILATERAL MAMMOGRAM WITH TOMOSYNTHESIS AND CAD
TECHNIQUE: Bilateral screening digital craniocaudal and mediolateral oblique
mammograms were obtained. Bilateral screening digital breast
tomosynthesis was performed. The images were evaluated with
computer-aided detection.

[L CC synth-2D]
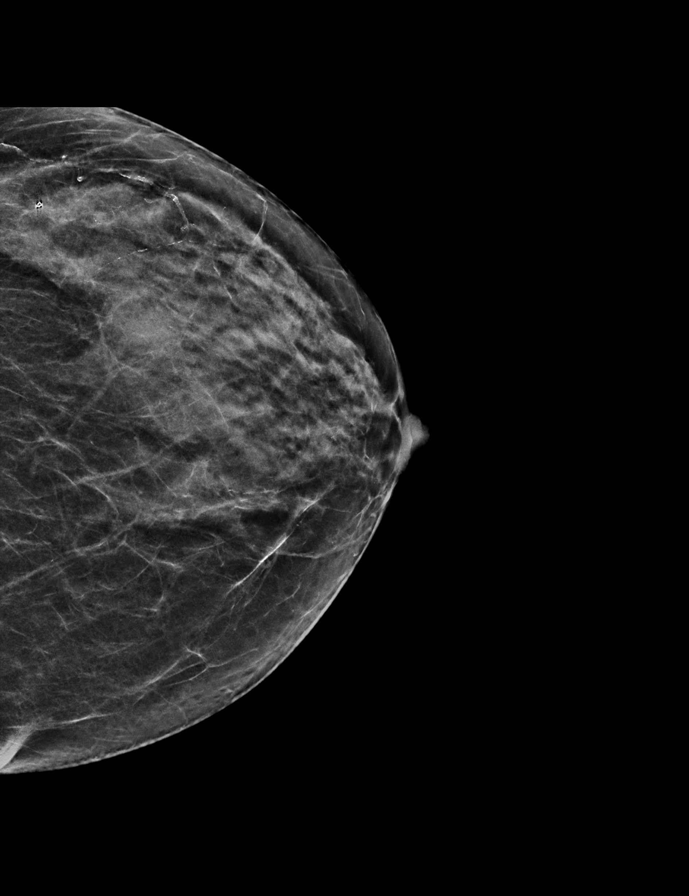

[R MLO synth-2D]
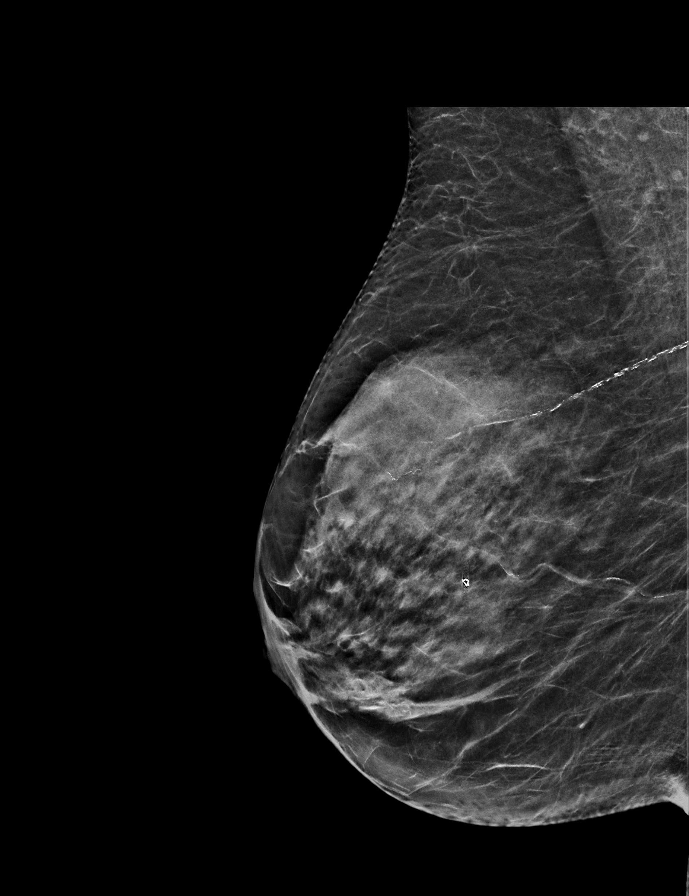

[L MLO synth-2D]
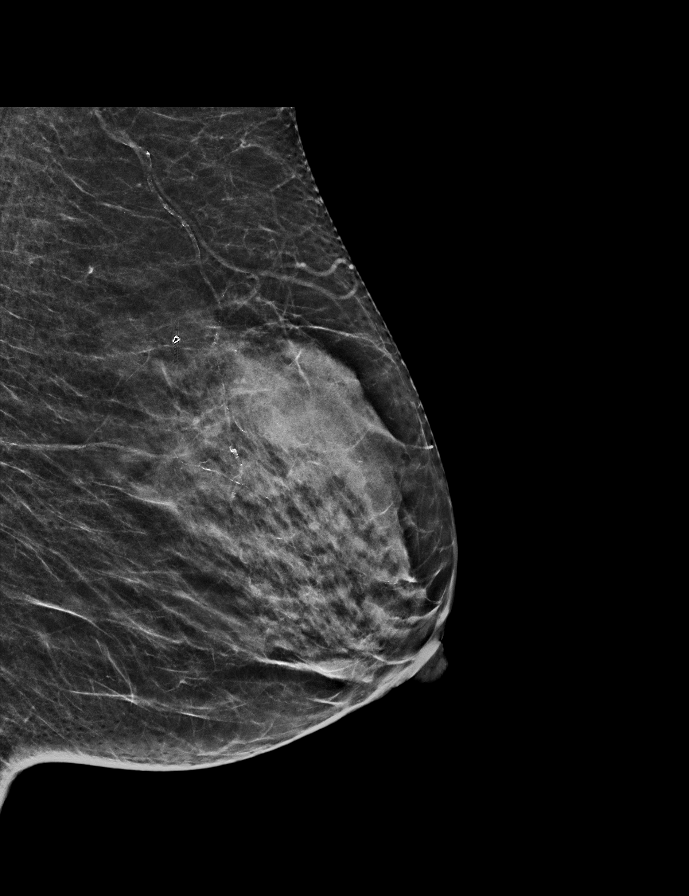

[R CC synth-2D]
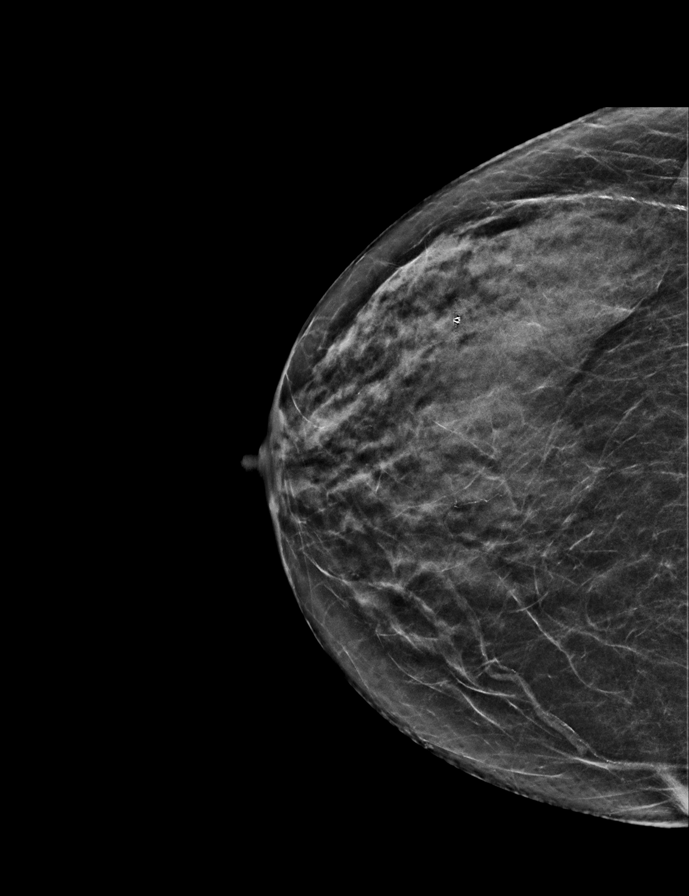

[R CC tomo · 2 of 47 frames shown]
[frame 16/47]
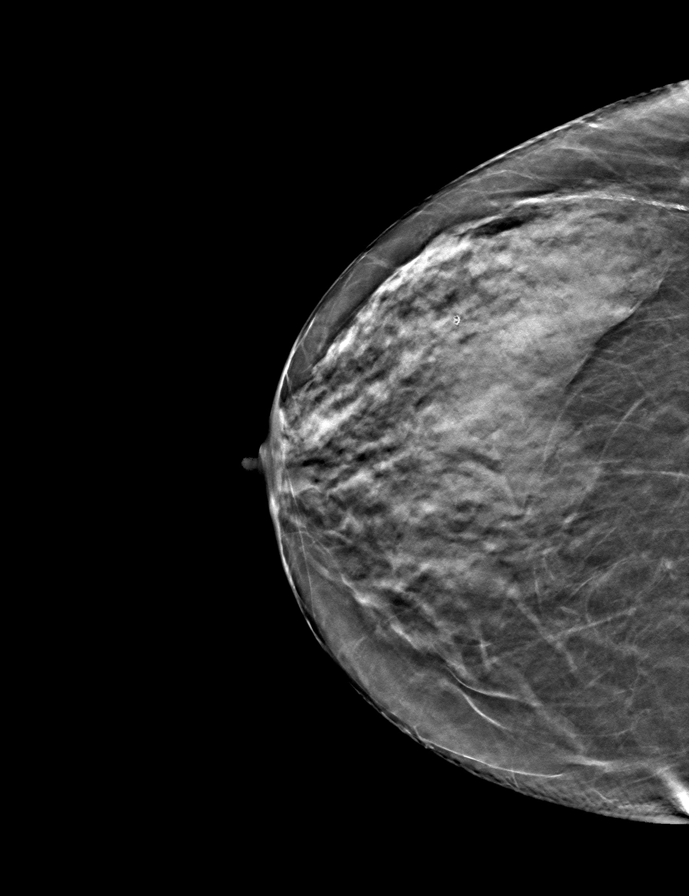
[frame 24/47]
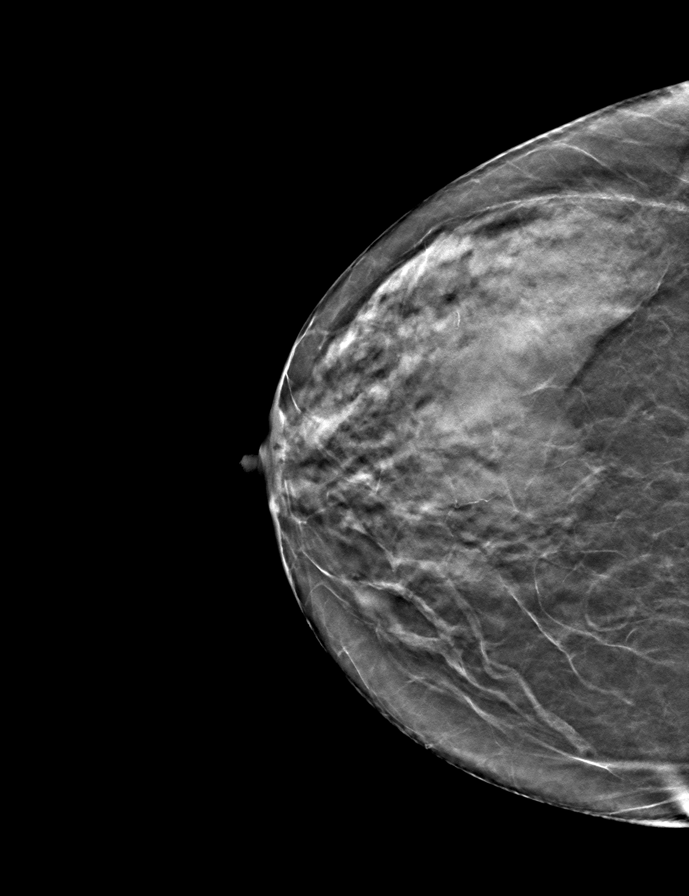

[R MLO tomo · tomo slice 26/51.0]
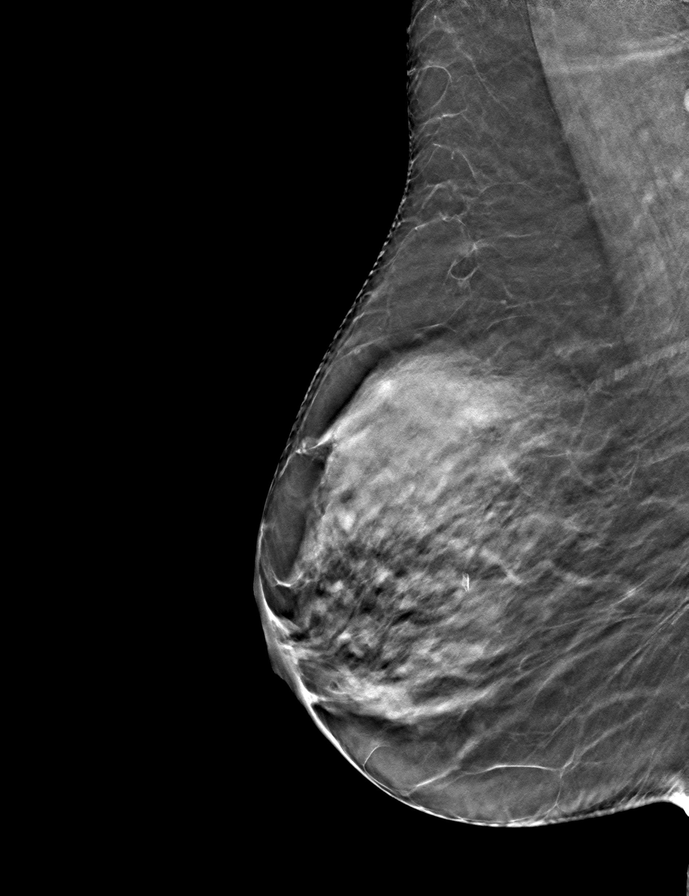

[L CC tomo · tomo slice 24/47.0]
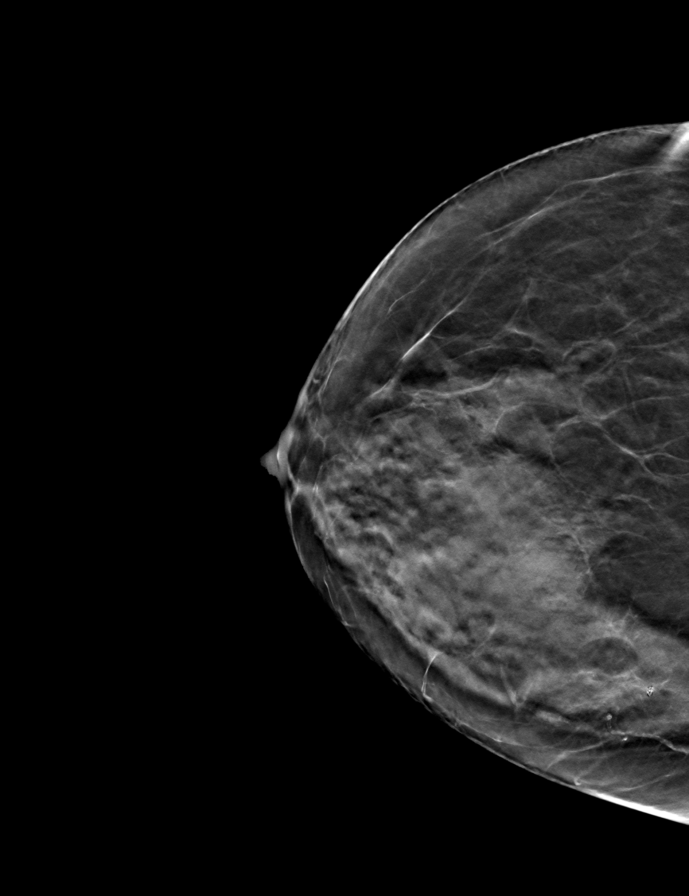

[L MLO tomo · tomo slice 25/49.0]
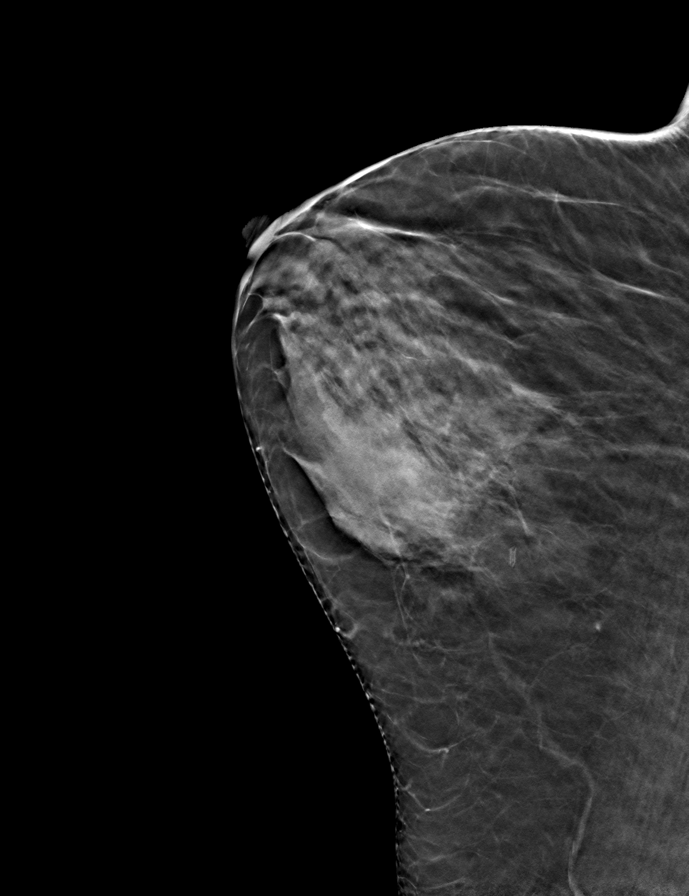

[9 of 24 positions shown; findings below may reference images not displayed]

ACR Breast Density Category d: The breast tissue is extremely dense,
which lowers the sensitivity of mammography
FINDINGS: There are no findings suspicious for malignancy.
IMPRESSION: No mammographic evidence of malignancy. A result letter of this
screening mammogram will be mailed directly to the patient.

RECOMMENDATION:
Screening mammogram in one year. (Code:[id number])

BI-RADS CATEGORY  1: Negative.

## 2022-09-09 ENCOUNTER — Other Ambulatory Visit: Payer: Self-pay | Admitting: Family Medicine

## 2022-09-12 ENCOUNTER — Telehealth: Payer: Self-pay | Admitting: Family Medicine

## 2022-09-12 NOTE — Telephone Encounter (Signed)
Contacted [name] to schedule their annual wellness visit. Appointment made for 09/21/2022.  [facility] [place] AWV TEAM Direct Dial [phone]

## 2022-09-14 ENCOUNTER — Ambulatory Visit: Payer: Medicare Other

## 2022-09-21 ENCOUNTER — Ambulatory Visit (INDEPENDENT_AMBULATORY_CARE_PROVIDER_SITE_OTHER): Payer: Medicare Other

## 2022-09-21 ENCOUNTER — Other Ambulatory Visit: Payer: Self-pay | Admitting: Family Medicine

## 2022-09-21 VITALS — Wt 147.0 lb

## 2022-09-21 DIAGNOSIS — Z0000 Encounter for general adult medical examination without abnormal findings: Secondary | ICD-10-CM

## 2022-09-21 NOTE — Progress Notes (Signed)
I connected with  Denise Rios on 09/21/22 by a audio enabled telemedicine application and verified that I am speaking with the correct person using two identifiers.  Patient Location: Home  Provider Location: Home Office  I discussed the limitations of evaluation and management by telemedicine. The patient expressed understanding and agreed to proceed.   Subjective:   Denise Rios is a 72 y.o. female who presents for Medicare Annual (Subsequent) preventive examination.  Review of Systems     Cardiac Risk Factors include: advanced age (>88men, >27 women);dyslipidemia;hypertension     Objective:    Today's Vitals   09/21/22 1135  Weight: 147 lb (66.7 kg)   Body mass index is 26.89 kg/m.     09/21/2022   11:38 AM 09/08/2021    1:10 PM 09/02/2020    1:33 PM 04/13/2020    1:43 PM  Advanced Directives  Does Patient Have a Medical Advance Directive? No Yes No No  Type of IT consultant of Healthcare Power of Attorney in Chart?  No - copy requested    Would patient like information on creating a medical advance directive? No - Patient declined  Yes (MAU/Ambulatory/Procedural Areas - Information given) No - Patient declined    Current Medications (verified) Outpatient Encounter Medications as of 09/21/2022  Medication Sig   alendronate (FOSAMAX) 70 MG tablet Take 1 tablet (70 mg total) by mouth every 7 (seven) days. Take with a full glass of water on an empty stomach. Must remain upright for 30 minutes   amLODipine (NORVASC) 2.5 MG tablet TAKE 1 TABLET(2.5 MG) BY MOUTH DAILY   buPROPion (WELLBUTRIN XL) 300 MG 24 hr tablet TAKE 1 TABLET(300 MG) BY MOUTH DAILY   diclofenac (VOLTAREN) 75 MG EC tablet Take 1 tablet (75 mg total) by mouth 2 (two) times daily.   fluticasone (FLONASE) 50 MCG/ACT nasal spray Place 2 sprays into both nostrils daily.   rosuvastatin (CRESTOR) 20 MG tablet TAKE 1 TABLET(10 MG) BY MOUTH AT BEDTIME    triamterene-hydrochlorothiazide (MAXZIDE-25) 37.5-25 MG tablet Take 1 tablet by mouth daily.   No facility-administered encounter medications on file as of 09/21/2022.    Allergies (verified) Lisinopril   History: Past Medical History:  Diagnosis Date   Anhedonia    Anxiety    Arteriosclerosis of carotid artery 03/19/2019   Less than 50%   Chicken pox    Hematuria 12/19/2017   History of fainting spells of unknown cause    Hyperlipidemia    Hypertension    Memory loss    prioor PCP records indicate neuro referral was made   Menopause    Osteoporosis    Rotator cuff (capsule) sprain    R>L   Syncope 09/2018   Vitamin D deficiency    Past Surgical History:  Procedure Laterality Date   CESAREAN SECTION  1980   ELECTROCARDIOGRAM  10/24/2018   SR. HR74, PR 167, QTC 406, NL-EKG   event monitor  01/10/2019   Predominant normal sinus rhythm.  HR 54-1 38.  Average HR 75.  No atrial fib.  Rare ectopic beats.  5 beat episode of nonsustained ventricular tachycardia x1.  No bradycardia arrhythmias.  No pauses.  Normal sinus rhythm without ectopy.   Image: CT chest  10/25/2018   normal- r/o PE   TRANSTHORACIC ECHOCARDIOGRAM  11/26/2018   EF 60-65%.  Normal study.  Completed presyncope.   US CAROTID DOPPLER BILATERAL ([facility] HX)  10/24/2018   Arteriolosclerosis.  Findings consistent with less than 50% stenosis.  Bilateral vertebral blood flow demonstrated.   Family History  Problem Relation Age of Onset   Alzheimer's disease Mother        6: 8 children on her mother's side have Alzheimer's.   Bone cancer Father    Early death Brother    Early death Maternal Grandfather    Heart attack Maternal Grandfather    Social History   Socioeconomic History   Marital status: Married    Spouse name: Not on file   Number of children: Not on file   Years of education: Not on file   Highest education level: Associate degree: academic program  Occupational History   Occupation: retired   Tobacco Use   Smoking status: Never   Smokeless tobacco: Never  Vaping Use   Vaping Use: Never used  Substance and Sexual Activity   Alcohol use: Yes   Drug use: Never   Sexual activity: Not Currently    Partners: Male  Other Topics Concern   Not on file  Social History Narrative   Marital status/children/pets: married, 2 children.    Education/employment: retired   Field seismologist:      -smoke alarm in the home:Yes     - wears seatbelt: Yes     - Feels safe in their relationships: Yes   Social Determinants of Health   Financial Resource Strain: Low Risk  (09/21/2022)   Overall Financial Resource Strain (CARDIA)    Difficulty of Paying Living Expenses: Not hard at all  Food Insecurity: No Food Insecurity (09/21/2022)   Hunger Vital Sign    Worried About Running Out of Food in the Last Year: Never true    Ran Out of Food in the Last Year: Never true  Transportation Needs: No Transportation Needs (09/21/2022)   PRAPARE - Administrator, Civil Service (Medical): No    Lack of Transportation (Non-Medical): No  Physical Activity: Inactive (09/21/2022)   Exercise Vital Sign    Days of Exercise per Week: 0 days    Minutes of Exercise per Session: 0 min  Stress: No Stress Concern Present (09/21/2022)   [employer] of Occupational Health - Occupational Stress Questionnaire    Feeling of Stress : Not at all  Social Connections: Moderately Integrated (09/21/2022)   Social Connection and Isolation Panel [NHANES]    Frequency of Communication with Friends and Family: More than three times a week    Frequency of Social Gatherings with Friends and Family: Three times a week    Attends Religious Services: 1 to 4 times per year    Active Member of Clubs or Organizations: No    Attends Banker Meetings: Never    Marital Status: Married    Tobacco Counseling Counseling given: Not Answered   Clinical Intake:  Pre-visit preparation completed: Yes  Pain :  No/denies pain     BMI - recorded: 26.89 Nutritional Status: BMI 25 -29 Overweight Nutritional Risks: None Diabetes: No  How often do you need to have someone help you when you read instructions, pamphlets, or other written materials from your doctor or pharmacy?: 1 - Never  Diabetic?no  Interpreter Needed?: No  Information entered by :: Lanier Ensign, LPN   Activities of Daily Living    09/21/2022   11:40 AM  In your present state of health, do you have any difficulty performing the following activities:  Hearing? 0  Vision? 0  Difficulty concentrating or making decisions? 0  Walking or climbing stairs? 0  Dressing or bathing? 0  Doing errands, shopping? 0  Preparing Food and eating ? N  Using the Toilet? N  In the past six months, have you accidently leaked urine? N  Do you have problems with loss of bowel control? N  Managing your Medications? N  Managing your Finances? N  Housekeeping or managing your Housekeeping? N    Patient Care Team: Natalia Leatherwood, DO as PCP - General (Family Medicine)  Indicate any recent Medical Services you may have received from other than [facility] providers in the past year (date may be approximate).     Assessment:   This is a routine wellness examination for Joanne.  Hearing/Vision screen Hearing Screening - Comments:: Pt denies any hearing issues  Vision Screening - Comments:: Pt follows up with Dr sellers for annual eye exams   Dietary issues and exercise activities discussed: Current Exercise Habits: The patient does not participate in regular exercise at present   Goals Addressed             This Visit's Progress    Patient Stated       Lose weight        Depression Screen    09/21/2022   11:38 AM 01/14/2022    9:45 AM 09/08/2021    1:09 PM 07/30/2021    1:10 PM 09/02/2020    1:37 PM 03/30/2020    1:02 PM 08/23/2019    2:38 PM  PHQ 2/9 Scores  PHQ - 2 Score 0 0 0 0 0 0 0  PHQ- 9 Score  3  4       Fall  Risk    09/21/2022   11:40 AM 01/13/2022    9:03 PM 09/08/2021    1:11 PM 07/30/2021    1:03 PM 09/02/2020    1:37 PM  Fall Risk   Falls in the past year? 0 0 0 0 0  Number falls in past yr: 0  0 0 0  Injury with Fall? 0  0 0 0  Risk for fall due to : Impaired vision  Impaired vision No Fall Risks   Follow up Falls prevention discussed  Falls prevention discussed Falls evaluation completed Falls prevention discussed    FALL RISK PREVENTION PERTAINING TO THE HOME:  Any stairs in or around the home? No  If so, are there any without handrails? No  Home free of loose throw rugs in walkways, pet beds, electrical cords, etc? Yes  Adequate lighting in your home to reduce risk of falls? Yes   ASSISTIVE DEVICES UTILIZED TO PREVENT FALLS:  Life alert? No  Use of a cane, walker or w/c? No  Grab bars in the bathroom? Yes  Shower chair or bench in shower? Yes  Elevated toilet seat or a handicapped toilet? No   TIMED UP AND GO:  Was the test performed? No .   Cognitive Function:        09/21/2022   11:41 AM 09/08/2021    1:13 PM  6CIT Screen  What Year? 0 points 0 points  What month? 0 points 0 points  What time? 0 points 0 points  Count back from 20 0 points 0 points  Months in reverse 0 points 2 points  Repeat phrase 0 points 0 points  Total Score 0 points 2 points    Immunizations Immunization History  Administered Date(s) Administered   PFIZER(Purple Top)SARS-COV-2 Vaccination 06/21/2019, 07/11/2019, 04/15/2020   Zoster Recombinat (Shingrix) 03/26/2013,  12/28/2021    TDAP status: Due, Education has been provided regarding the importance of this vaccine. Advised may receive this vaccine at local pharmacy or Health Dept. Aware to provide a copy of the vaccination record if obtained from local pharmacy or Health Dept. Verbalized acceptance and understanding.  Flu Vaccine status: Declined, Education has been provided regarding the importance of this vaccine but patient still  declined. Advised may receive this vaccine at local pharmacy or Health Dept. Aware to provide a copy of the vaccination record if obtained from local pharmacy or Health Dept. Verbalized acceptance and understanding.  Pneumococcal vaccine status: Declined,  Education has been provided regarding the importance of this vaccine but patient still declined. Advised may receive this vaccine at local pharmacy or Health Dept. Aware to provide a copy of the vaccination record if obtained from local pharmacy or Health Dept. Verbalized acceptance and understanding.   Covid-19 vaccine status: Declined, Education has been provided regarding the importance of this vaccine but patient still declined. Advised may receive this vaccine at local pharmacy or Health Dept.or vaccine clinic. Aware to provide a copy of the vaccination record if obtained from local pharmacy or Health Dept. Verbalized acceptance and understanding.  Qualifies for Shingles Vaccine? Yes   Zostavax completed Yes   Shingrix Completed?: Yes  Screening Tests Health Maintenance  Topic Date Due   DTaP/Tdap/Td (1 - Tdap) Never done   INFLUENZA VACCINE  12/29/2022   MAMMOGRAM  03/30/2023   Medicare Annual Wellness (AWV)  09/21/2023   DEXA SCAN  03/29/2024   COLONOSCOPY (Pts 45-28yrs Insurance coverage will need to be confirmed)  05/30/2024   Zoster Vaccines- Shingrix  Completed   HPV VACCINES  Aged Out   Pneumonia Vaccine 40+ Years old  Discontinued   COVID-19 Vaccine  Discontinued   Hepatitis C Screening  Discontinued    Health Maintenance  Health Maintenance Due  Topic Date Due   DTaP/Tdap/Td (1 - Tdap) Never done    Colorectal cancer screening: Type of screening: Colonoscopy. Completed 05/30/14. Repeat every 10 years  Mammogram status: Completed 04/06/21. Repeat every year  Bone Density status: Completed 03/29/21. Results reflect: Bone density results: OSTEOPOROSIS. Repeat every 2 years.  Additional Screening:  Hepatitis C  Screening: does not qualify;  Vision Screening: Recommended annual ophthalmology exams for early detection of glaucoma and other disorders of the eye. Is the patient up to date with their annual eye exam?  Yes  Who is the provider or what is the name of the office in which the patient attends annual eye exams? Dr Sallyanne Kuster  If pt is not established with a provider, would they like to be referred to a provider to establish care? No .   Dental Screening: Recommended annual dental exams for proper oral hygiene  Community Resource Referral / Chronic Care Management: CRR required this visit?  No   CCM required this visit?  No      Plan:     I have personally reviewed and noted the following in the patient's chart:   Medical and social history Use of alcohol, tobacco or illicit drugs  Current medications and supplements including opioid prescriptions. Patient is not currently taking opioid prescriptions. Functional ability and status Nutritional status Physical activity Advanced directives List of other physicians Hospitalizations, surgeries, and ER visits in previous 12 months Vitals Screenings to include cognitive, depression, and falls Referrals and appointments  In addition, I have reviewed and discussed with patient certain preventive protocols, quality metrics, and best practice  recommendations. A written personalized care plan for preventive services as well as general preventive health recommendations were provided to patient.     Marzella Schlein, LPN   09/05/8117   Nurse Notes: none

## 2022-09-21 NOTE — Patient Instructions (Signed)
Denise Rios , Thank you for taking time to come for your Medicare Wellness Visit. I appreciate your ongoing commitment to your health goals. Please review the following plan we discussed and let me know if I can assist you in the future.   These are the goals we discussed:  Goals      Patient Stated     Would like to lose about 5 pounds, drink more & eat healthier     Patient Stated     None at this time     Patient Stated     Lose weight         This is a list of the screening recommended for you and due dates:  Health Maintenance  Topic Date Due   DTaP/Tdap/Td vaccine (1 - Tdap) Never done   Flu Shot  12/29/2022   Mammogram  03/30/2023   Medicare Annual Wellness Visit  09/21/2023   DEXA scan (bone density measurement)  03/29/2024   Colon Cancer Screening  05/30/2024   Zoster (Shingles) Vaccine  Completed   HPV Vaccine  Aged Out   Pneumonia Vaccine  Discontinued   COVID-19 Vaccine  Discontinued   Hepatitis C Screening: USPSTF Recommendation to screen - Ages 30-79 yo.  Discontinued    Advanced directives: Advance directive discussed with you today. Even though you declined this today please call our office should you change your mind and we can give you the proper paperwork for you to fill out.  Conditions/risks identified: lose weight   Next appointment: Follow up in one year for your annual wellness visit    Preventive Care 65 Years and Older, Female Preventive care refers to lifestyle choices and visits with your health care provider that can promote health and wellness. What does preventive care include? A yearly physical exam. This is also called an annual well check. Dental exams once or twice a year. Routine eye exams. Ask your health care provider how often you should have your eyes checked. Personal lifestyle choices, including: Daily care of your teeth and gums. Regular physical activity. Eating a healthy diet. Avoiding tobacco and drug use. Limiting alcohol  use. Practicing safe sex. Taking low-dose aspirin every day. Taking vitamin and mineral supplements as recommended by your health care provider. What happens during an annual well check? The services and screenings done by your health care provider during your annual well check will depend on your age, overall health, lifestyle risk factors, and family history of disease. Counseling  Your health care provider may ask you questions about your: Alcohol use. Tobacco use. Drug use. Emotional well-being. Home and relationship well-being. Sexual activity. Eating habits. History of falls. Memory and ability to understand (cognition). Work and work Astronomer. Reproductive health. Screening  You may have the following tests or measurements: Height, weight, and BMI. Blood pressure. Lipid and cholesterol levels. These may be checked every 5 years, or more frequently if you are over 12 years old. Skin check. Lung cancer screening. You may have this screening every year starting at age 42 if you have a 30-pack-year history of smoking and currently smoke or have quit within the past 15 years. Fecal occult blood test (FOBT) of the stool. You may have this test every year starting at age 31. Flexible sigmoidoscopy or colonoscopy. You may have a sigmoidoscopy every 5 years or a colonoscopy every 10 years starting at age 51. Hepatitis C blood test. Hepatitis B blood test. Sexually transmitted disease (STD) testing. Diabetes screening. This is done  by checking your blood sugar (glucose) after you have not eaten for a while (fasting). You may have this done every 1-3 years. Bone density scan. This is done to screen for osteoporosis. You may have this done starting at age 24. Mammogram. This may be done every 1-2 years. Talk to your health care provider about how often you should have regular mammograms. Talk with your health care provider about your test results, treatment options, and if necessary,  the need for more tests. Vaccines  Your health care provider may recommend certain vaccines, such as: Influenza vaccine. This is recommended every year. Tetanus, diphtheria, and acellular pertussis (Tdap, Td) vaccine. You may need a Td booster every 10 years. Zoster vaccine. You may need this after age 21. Pneumococcal 13-valent conjugate (PCV13) vaccine. One dose is recommended after age 17. Pneumococcal polysaccharide (PPSV23) vaccine. One dose is recommended after age 72. Talk to your health care provider about which screenings and vaccines you need and how often you need them. This information is not intended to replace advice given to you by your health care provider. Make sure you discuss any questions you have with your health care provider. Document Released: 06/12/2015 Document Revised: 02/03/2016 Document Reviewed: 03/17/2015 Elsevier Interactive Patient Education  2017 [place] Prevention in the Home Falls can cause injuries. They can happen to people of all ages. There are many things you can do to make your home safe and to help prevent falls. What can I do on the outside of my home? Regularly fix the edges of walkways and driveways and fix any cracks. Remove anything that might make you trip as you walk through a door, such as a raised step or threshold. Trim any bushes or trees on the path to your home. Use bright outdoor lighting. Clear any walking paths of anything that might make someone trip, such as rocks or tools. Regularly check to see if handrails are loose or broken. Make sure that both sides of any steps have handrails. Any raised decks and porches should have guardrails on the edges. Have any leaves, snow, or ice cleared regularly. Use sand or salt on walking paths during winter. Clean up any spills in your garage right away. This includes oil or grease spills. What can I do in the bathroom? Use night lights. Install grab bars by the toilet and in the  tub and shower. Do not use towel bars as grab bars. Use non-skid mats or decals in the tub or shower. If you need to sit down in the shower, use a plastic, non-slip stool. Keep the floor dry. Clean up any water that spills on the floor as soon as it happens. Remove soap buildup in the tub or shower regularly. Attach bath mats securely with double-sided non-slip rug tape. Do not have throw rugs and other things on the floor that can make you trip. What can I do in the bedroom? Use night lights. Make sure that you have a light by your bed that is easy to reach. Do not use any sheets or blankets that are too big for your bed. They should not hang down onto the floor. Have a firm chair that has side arms. You can use this for support while you get dressed. Do not have throw rugs and other things on the floor that can make you trip. What can I do in the kitchen? Clean up any spills right away. Avoid walking on wet floors. Keep items that you use  a lot in easy-to-reach places. If you need to reach something above you, use a strong step stool that has a grab bar. Keep electrical cords out of the way. Do not use floor polish or wax that makes floors slippery. If you must use wax, use non-skid floor wax. Do not have throw rugs and other things on the floor that can make you trip. What can I do with my stairs? Do not leave any items on the stairs. Make sure that there are handrails on both sides of the stairs and use them. Fix handrails that are broken or loose. Make sure that handrails are as long as the stairways. Check any carpeting to make sure that it is firmly attached to the stairs. Fix any carpet that is loose or worn. Avoid having throw rugs at the top or bottom of the stairs. If you do have throw rugs, attach them to the floor with carpet tape. Make sure that you have a light switch at the top of the stairs and the bottom of the stairs. If you do not have them, ask someone to add them for  you. What else can I do to help prevent falls? Wear shoes that: Do not have high heels. Have rubber bottoms. Are comfortable and fit you well. Are closed at the toe. Do not wear sandals. If you use a stepladder: Make sure that it is fully opened. Do not climb a closed stepladder. Make sure that both sides of the stepladder are locked into place. Ask someone to hold it for you, if possible. Clearly mark and make sure that you can see: Any grab bars or handrails. First and last steps. Where the edge of each step is. Use tools that help you move around (mobility aids) if they are needed. These include: Canes. Walkers. Scooters. Crutches. Turn on the lights when you go into a dark area. Replace any light bulbs as soon as they burn out. Set up your furniture so you have a clear path. Avoid moving your furniture around. If any of your floors are uneven, fix them. If there are any pets around you, be aware of where they are. Review your medicines with your doctor. Some medicines can make you feel dizzy. This can increase your chance of falling. Ask your doctor what other things that you can do to help prevent falls. This information is not intended to replace advice given to you by your health care provider. Make sure you discuss any questions you have with your health care provider. Document Released: 03/12/2009 Document Revised: 10/22/2015 Document Reviewed: 06/20/2014 Elsevier Interactive Patient Education  2017 [employer].

## 2022-10-20 ENCOUNTER — Other Ambulatory Visit: Payer: Self-pay | Admitting: Family Medicine

## 2022-11-07 ENCOUNTER — Telehealth: Payer: Self-pay

## 2022-11-07 NOTE — Telephone Encounter (Signed)
   Reason for Referral Request:  Arthritis  Has patient been seen PCP for this complaint? Yes  No,  please schedule patient for appointment for complaint.  Patient scheduled on:  Patient stated she spoke to Dr. Claiborne Billings last time she was here.  Voltaren was prescribed. Tried med for a couple of months, not really helping, would like referral of her choice  Yes, please find out following information.  Referral for which specialty: ?  Preferred office/provider:   Dr. Alan Ripper recommendation for arthritis

## 2022-11-09 NOTE — Telephone Encounter (Signed)
Patient has requested referral for her "arthritis.  " Please encourage patient to make an appointment to discuss her arthritis pain.  Arthritis pain is worked up and treated by primary care unless it proves to be rheumatoid or like arthritis. Or it is joint that is worsening bad enough that needs surgical intervention by an orthopedic.  We would first need further evaluate her if the diclofenac is not working for her arthritis.

## 2022-11-09 NOTE — Telephone Encounter (Signed)
Spoke with patient regarding results/recommendations.  Pt scheduled 

## 2022-11-10 ENCOUNTER — Encounter: Payer: Self-pay | Admitting: Family Medicine

## 2022-11-10 ENCOUNTER — Ambulatory Visit (INDEPENDENT_AMBULATORY_CARE_PROVIDER_SITE_OTHER): Payer: Medicare Other | Admitting: Family Medicine

## 2022-11-10 ENCOUNTER — Ambulatory Visit (HOSPITAL_BASED_OUTPATIENT_CLINIC_OR_DEPARTMENT_OTHER)
Admission: RE | Admit: 2022-11-10 | Discharge: 2022-11-10 | Disposition: A | Discharge status: Home / Self Care | Payer: Medicare Other | Source: Ambulatory Visit | Attending: Family Medicine | Admitting: Family Medicine

## 2022-11-10 VITALS — BP 145/85 | HR 64 | Temp 98.2°F | Ht 62.0 in | Wt 143.0 lb

## 2022-11-10 DIAGNOSIS — M545 Low back pain, unspecified: Secondary | ICD-10-CM

## 2022-11-10 DIAGNOSIS — M542 Cervicalgia: Secondary | ICD-10-CM | POA: Diagnosis not present

## 2022-11-10 DIAGNOSIS — M2550 Pain in unspecified joint: Secondary | ICD-10-CM | POA: Diagnosis not present

## 2022-11-10 DIAGNOSIS — Z820 Family history of epilepsy and other diseases of the nervous system: Secondary | ICD-10-CM | POA: Insufficient documentation

## 2022-11-10 DIAGNOSIS — M810 Age-related osteoporosis without current pathological fracture: Secondary | ICD-10-CM

## 2022-11-10 LAB — SEDIMENTATION RATE: Sed Rate: 14 mm/hr (ref 0–30)

## 2022-11-10 NOTE — Progress Notes (Signed)
Patient ID: Denise Rios, female  DOB: [birth year], 72 y.o.   MRN: [id number] Patient Care Team    Relationship Specialty Notifications Start End  Natalia Leatherwood, DO PCP - General Family Medicine  02/20/19     Chief Complaint  Patient presents with   Joint Pain    Pt would like referral regarding arthritis in her back   Subjective: Denise Rios is a 72 y.o.  female present for [facility] Lumbar back pain/arthralgia Patient reports she has been using the diclofenac twice daily.  She does not feel it takes away the pain on many days.  Patient reports her lower back is starting to hurt more frequently, and taking longer to respond to rest and medication. She reports just about any activity or even standing for long periods of time now cause increased back pain.  She denies any prior injury to her back.  She denies radiation of pain or numbness and tingling in extremities.  She reports the pain gets so bad she becomes in a bad mood and more irritable because of the pain. No fhx or  known personal history of RA, AA diseases or inflammatory arthritis.    11/10/2022    9:36 AM 09/21/2022   11:38 AM 01/14/2022    9:45 AM 09/08/2021    1:09 PM 07/30/2021    1:10 PM  Depression screen PHQ 2/9  Decreased Interest 0 0 0 0 0  Down, Depressed, Hopeless 0 0 0 0 0  PHQ - 2 Score 0 0 0 0 0  Altered sleeping   2  1  Tired, decreased energy   0  0  Change in appetite   0  2  Feeling bad or failure about yourself    1  0  Trouble concentrating   0  1  Moving slowly or fidgety/restless   0  0  Suicidal thoughts   0  0  PHQ-9 Score   3  4      01/14/2022    9:45 AM 07/30/2021    1:10 PM 08/23/2019    2:41 PM 02/20/2019    1:13 PM  GAD 7 : Generalized Anxiety Score  Nervous, Anxious, on Edge 0 0 0 1  Control/stop worrying 0 0 0 1  Worry too much - different things 0 0 0 2  Trouble relaxing 1 0 0 0  Restless 0 0 0 0  Easily annoyed or irritable 0 0 0 0  Afraid - awful might happen 1 0 0 1  Total  GAD 7 Score 2 0 0 5  Anxiety Difficulty   Not difficult at all Not difficult at all           11/10/2022    9:36 AM 09/21/2022   11:40 AM 01/13/2022    9:03 PM 09/08/2021    1:11 PM 07/30/2021    1:03 PM  Fall Risk   Falls in the past year? 1 0 0 0 0  Number falls in past yr: 0 0  0 0  Injury with Fall? 0 0  0 0  Risk for fall due to : No Fall Risks Impaired vision  Impaired vision No Fall Risks  Follow up Falls evaluation completed Falls prevention discussed  Falls prevention discussed Falls evaluation completed     Immunization History  Administered Date(s) Administered   PFIZER(Purple Top)SARS-COV-2 Vaccination 06/21/2019, 07/11/2019, 04/15/2020   Zoster Recombinat (Shingrix) 03/26/2013, 12/28/2021    No results found.  Past  Medical History:  Diagnosis Date   Anhedonia    Anxiety    Arteriosclerosis of carotid artery 03/19/2019   Less than 50%   Chicken pox    Hematuria 12/19/2017   History of fainting spells of unknown cause    Hyperlipidemia    Hypertension    Memory loss    prioor PCP records indicate neuro referral was made   Menopause    Osteoporosis    Rotator cuff (capsule) sprain    R>L   Syncope 09/2018   Vitamin D deficiency    Allergies  Allergen Reactions   Lisinopril Cough   Past Surgical History:  Procedure Laterality Date   CESAREAN SECTION  1980   ELECTROCARDIOGRAM  10/24/2018   SR. HR74, PR 167, QTC 406, NL-EKG   event monitor  01/10/2019   Predominant normal sinus rhythm.  HR 54-1 38.  Average HR 75.  No atrial fib.  Rare ectopic beats.  5 beat episode of nonsustained ventricular tachycardia x1.  No bradycardia arrhythmias.  No pauses.  Normal sinus rhythm without ectopy.   Image: CT chest  10/25/2018   normal- r/o PE   TRANSTHORACIC ECHOCARDIOGRAM  11/26/2018   EF 60-65%.  Normal study.  Completed presyncope.   US CAROTID DOPPLER BILATERAL ([facility] HX)  10/24/2018   Arteriolosclerosis.  Findings consistent with less than 50% stenosis.   Bilateral vertebral blood flow demonstrated.   Family History  Problem Relation Age of Onset   Alzheimer's disease Mother        6: 8 children on her mother's side have Alzheimer's.   Bone cancer Father    Early death Brother    Early death Maternal Grandfather    Heart attack Maternal Grandfather    Social History   Social History Narrative   Marital status/children/pets: married, 2 children.    Education/employment: retired   Field seismologist:      -smoke alarm in the home:Yes     - wears seatbelt: Yes     - Feels safe in their relationships: Yes    Allergies as of 11/10/2022       Reactions   Lisinopril Cough        Medication List        Accurate as of November 10, 2022 12:48 PM. If you have any questions, ask your nurse or doctor.          alendronate 70 MG tablet Commonly known as: FOSAMAX Take 1 tablet (70 mg total) by mouth every 7 (seven) days. Take with a full glass of water on an empty stomach. Must remain upright for 30 minutes   amLODipine 2.5 MG tablet Commonly known as: NORVASC TAKE 1 TABLET(2.5 MG) BY MOUTH DAILY   buPROPion 300 MG 24 hr tablet Commonly known as: WELLBUTRIN XL TAKE 1 TABLET(300 MG) BY MOUTH DAILY   diclofenac 75 MG EC tablet Commonly known as: VOLTAREN Take 1 tablet (75 mg total) by mouth 2 (two) times daily.   fluticasone 50 MCG/ACT nasal spray Commonly known as: FLONASE Place 2 sprays into both nostrils daily.   rosuvastatin 20 MG tablet Commonly known as: CRESTOR TAKE 1 TABLET(10 MG) BY MOUTH AT BEDTIME   triamterene-hydrochlorothiazide 37.5-25 MG tablet Commonly known as: MAXZIDE-25 Take 1 tablet by mouth daily.        All past medical history, surgical history, allergies, family history, immunizations andmedications were updated in the EMR today and reviewed under the history and medication portions of their EMR.    No results  found for this or any previous visit (from the past 2160 hour(s)).  Patient was never  admitted.   ROS: 14 pt review of systems performed and negative (unless mentioned in an HPI)  Objective: BP (!) 145/85   Pulse 64   Temp 98.2 F (36.8 C)   Ht 5\' 2"  (1.575 m)   Wt 143 lb (64.9 kg)   SpO2 99%   BMI 26.16 kg/m  Physical Exam Vitals and nursing note reviewed.  Constitutional:      General: She is not in acute distress.    Appearance: Normal appearance. She is normal weight. She is not ill-appearing or toxic-appearing.  HENT:     Head: Normocephalic and atraumatic.  Eyes:     General: No scleral icterus.       Right eye: No discharge.        Left eye: No discharge.     Extraocular Movements: Extraocular movements intact.     Conjunctiva/sclera: Conjunctivae normal.     Pupils: Pupils are equal, round, and reactive to light.  Musculoskeletal:     Thoracic back: Normal. No spasms, tenderness or bony tenderness. Normal range of motion.     Lumbar back: Spasms present. No tenderness or bony tenderness. Normal range of motion. Negative right straight leg raise test and negative left straight leg raise test.       Back:     Comments: Lumbar spine: Mild tissue texture change right lumbar paraspinal.  Full range of motion without discomfort.  Negative SLR bilaterally, negative FABRE bilaterally.  DTR equal bilaterally.  Neurovascular intact distally  Skin:    Findings: No rash.  Neurological:     Mental Status: She is alert and oriented to person, place, and time. Mental status is at baseline.     Motor: No weakness.     Coordination: Coordination normal.     Gait: Gait normal.  Psychiatric:        Mood and Affect: Mood normal.        Behavior: Behavior normal.        Thought Content: Thought content normal.        Judgment: Judgment normal.     Assessment/plan: EVELEN JANIAK is a 72 y.o. female present for Lumbar pain/neck pain/polyarthralgia We discussed in detail today different types of arthritis and treatment plans for each. The majority of her pain  is in her lower back, although she has arthralgias in multiple joints. She understands mainstay of treatment is pain control, physical therapy and/or surgical treatment for osteoarthritis/degenerative lumbar spine. Continue diclofenac (VOLTAREN) 75 MG EC tablet; Take 1 tablet (75 mg total) by mouth 2 (two) times daily.  Dispense: 60 tablet; Refill: 5 Labs collected today CCP, rheumatoid factor and sed rate collected today to rule out other types of arthritis that could be affecting her. X-ray of lumbar spine ordered. Would recommend starting physical therapy as long as x-ray does not show abnormality preventing physical therapy.  Patient is agreement with this plan. We discussed pain regimen if labs are normal, x-ray is normal and patient does not respond to physical therapy can be discussed in the future.  Would consider starting tramadol chronically for her if needed. Await lab and x-ray results to discuss further plan which could include referrals Ortho, rheumatology, OMT and/or physical therapy.  Patient reports understanding  Return in about 6 weeks (around 12/22/2022) for Routine chronic condition follow-up.   No orders of the defined types were placed in this encounter.  Orders  Placed This Encounter  Procedures   DG Lumbar Spine Complete   ANA, IFA Comprehensive Panel-([facility])   Cyclic citrul peptide antibody, IgG ([facility])   Rheumatoid Factor   Sedimentation rate      Note is dictated utilizing voice recognition software. Although note has been proof read prior to signing, occasional typographical errors still can be missed. If any questions arise, please do not hesitate to call for verification.  Electronically signed by: Felix Pacini, DO [facility]

## 2022-11-10 NOTE — Patient Instructions (Addendum)
Return in about 6 weeks (around 12/22/2022) for Routine chronic condition follow-up.        Great to see you today.  I have refilled the medication(s) we provide.   If labs were collected, we will inform you of lab results once received either by echart message or telephone call.   - echart message- for normal results that have been seen by the patient already.   - telephone call: abnormal results or if patient has not viewed results in their echart.

## 2022-11-13 LAB — CYCLIC CITRUL PEPTIDE ANTIBODY, IGG: Cyclic Citrullin Peptide Ab: <16 UNITS

## 2022-11-13 LAB — ANA, IFA COMPREHENSIVE PANEL
Anti Nuclear Antibody (ANA): POSITIVE — AB
ENA SM Ab Ser-aCnc: <1 AI
SM/RNP: <1 AI
SSA (Ro) (ENA) Antibody, IgG: <1 AI
SSB (La) (ENA) Antibody, IgG: <1 AI
Scleroderma (Scl-70) (ENA) Antibody, IgG: <1 AI
ds DNA Ab: <1 IU/mL

## 2022-11-13 LAB — ANTI-NUCLEAR AB-TITER (ANA TITER): ANA Titer 1: 1:160 {titer} — ABNORMAL HIGH

## 2022-11-13 LAB — RHEUMATOID FACTOR: Rheumatoid fact SerPl-aCnc: <10 IU/mL (ref <14)

## 2022-11-15 ENCOUNTER — Telehealth: Payer: Self-pay | Admitting: Family Medicine

## 2022-11-15 DIAGNOSIS — M545 Low back pain, unspecified: Secondary | ICD-10-CM

## 2022-11-15 DIAGNOSIS — R768 Other specified abnormal immunological findings in serum: Secondary | ICD-10-CM

## 2022-11-15 NOTE — Telephone Encounter (Signed)
Spoke with patient regarding results/recommendations.  

## 2022-11-15 NOTE — Telephone Encounter (Signed)
Please call patient Her x-ray is reassuring with only very mild arthritic changes of her lower back. Inflammatory marker was normal.  Rheumatoid arthritis labs were normal.  She did have a positive ANA.  This is a generalized autoimmune disorder screening.  She has a low titer at 1: 160.  This titer could be signs of an early autoimmune disorder, but also titers of this level can be found in patients that have no autoimmune disorder.   To be safe, I recommend we send her to rheumatology for further evaluation with a positive ANA. Take the diclofenac to help with her lower back discomfort and I have referred her to sports med and physical therapy to help with back strengthening.

## 2022-11-21 ENCOUNTER — Encounter: Payer: Self-pay | Admitting: Family Medicine

## 2022-11-21 NOTE — Telephone Encounter (Signed)
No further action needed.

## 2022-11-22 ENCOUNTER — Ambulatory Visit: Payer: Medicare Other | Admitting: Family Medicine

## 2022-11-22 ENCOUNTER — Ambulatory Visit (INDEPENDENT_AMBULATORY_CARE_PROVIDER_SITE_OTHER): Payer: Medicare Other | Admitting: Family Medicine

## 2022-11-22 VITALS — BP 154/80 | HR 69 | Ht 62.0 in | Wt 141.0 lb

## 2022-11-22 DIAGNOSIS — R768 Other specified abnormal immunological findings in serum: Secondary | ICD-10-CM | POA: Diagnosis not present

## 2022-11-22 DIAGNOSIS — M545 Low back pain, unspecified: Secondary | ICD-10-CM | POA: Diagnosis not present

## 2022-11-22 NOTE — Patient Instructions (Signed)
Thank you for coming in today.   I've referred you to Physical Therapy.  Let us know if you don't hear from them in one week.   I am referring to a different Rheumatology location that should get you in sooner. You should hear from them soon.   [facility] Rhe[facility].   Recheck in 8 weeks.   Let me know sooner if this is not working.

## 2022-11-22 NOTE — Progress Notes (Signed)
Rubin Payor, PhD, LAT, ATC acting as a scribe for Clementeen Graham, MD.  Denise Rios is a 72 y.o. female who presents to [facility] today for low back pain that's chronic in nature, but hurting more frequently and worsening. Pt locates pain to both sides of her low back. Pain is varying.  Her PCP did some rheumatologic labs and ANA was elevated.  She did refer to rheumatology but the next appointment with Dr. Corliss Skains is known for 6 months.  Radiating pain: no LE numbness/tingling: no LE weakness: no Aggravates: increased activity, prolonged standing, modify activity Treatments tried: rest, oral diclofenac, tylenol  Dx testing: 11/10/22 L-spine XR & labs  Pertinent review of systems: No fevers or chills  Relevant historical information: Hypertension.  Osteoporosis on Fosamax.   Exam:  BP (!) 154/80   Pulse 69   Ht 5\' 2"  (1.575 m)   Wt 141 lb (64 kg)   SpO2 99%   BMI 25.79 kg/m  General: Well Developed, well nourished, and in no acute distress.   MSK: L-spine: Normal appearing Nontender palpation spinal midline.  Tender palpation paraspinal musculature. Intact lumbar motion. Lower extremity strength is intact.    Lab and Radiology Results  EXAM: LUMBAR SPINE - COMPLETE 4+ VIEW   COMPARISON:  Lumbar spine radiographs 03/30/2020.   FINDINGS: There are 5 lumbar type vertebral bodies. The alignment is normal. The disc spaces are preserved. No evidence of acute fracture or pars defect. Minimal lower lumbar facet arthropathy for age. The sacroiliac joints appear unremarkable. Mildly prominent stool noted throughout the colon.   IMPRESSION: Minimal lower lumbar facet arthropathy for age. No acute osseous findings or malalignment.     Electronically Signed   By: Carey Bullocks M.D.   On: 11/15/2022 10:51 I, Clementeen Graham, personally (independently) visualized and performed the interpretation of the images attached in this  note.'   EXAM: LUMBAR SPINE - COMPLETE 4+ VIEW   COMPARISON:  Lumbar spine radiographs 03/30/2020.   FINDINGS: There are 5 lumbar type vertebral bodies. The alignment is normal. The disc spaces are preserved. No evidence of acute fracture or pars defect. Minimal lower lumbar facet arthropathy for age. The sacroiliac joints appear unremarkable. Mildly prominent stool noted throughout the colon.   IMPRESSION: Minimal lower lumbar facet arthropathy for age. No acute osseous findings or malalignment.     Electronically Signed   By: Carey Bullocks M.D.   On: 11/15/2022 10:51   Assessment and Plan: 72 y.o. female with chronic low back pain worsening recently.  Pain thought to be due to muscle spasm and dysfunction.  She does have some degenerative changes seen on her recent x-ray.  There may be a rheumatologic component as well.  Plan for physical therapy.  She lives in [place] so I found a physical therapy location her to where she lives.  Addition I do think it is reasonable for her to have a consultation with rheumatologist.  I referred to [facility] rheumatology.  They should be able to see her much sooner than Dr. Corliss Skains.  Recheck in about 8 weeks or sooner if needed.  No need for follow-up if feeling better.  PDMP not reviewed this encounter. Orders Placed This Encounter  Procedures   Ambulatory referral to Rheumatology    Referral Priority:   Routine    Referral Type:   Consultation    Referral Reason:   Specialty Services Required    Requested Specialty:  Rheumatology    Number of Visits Requested:   1   Ambulatory referral to Physical Therapy    Referral Priority:   Routine    Referral Type:   Physical Medicine    Referral Reason:   Specialty Services Required    Requested Specialty:   Physical Therapy    Number of Visits Requested:   1   No orders of the defined types were placed in this encounter.    Discussed warning signs or  symptoms. Please see discharge instructions. Patient expresses understanding.   The above documentation has been reviewed and is accurate and complete Clementeen Graham, M.D.

## 2022-11-23 NOTE — Therapy (Signed)
OUTPATIENT PHYSICAL THERAPY THORACOLUMBAR EVALUATION   Patient Name: Denise Rios MRN: [id number] DOB:[birth year], 72 y.o., female Today's Date: 11/24/2022  END OF SESSION:  PT End of Session - 11/24/22 1109     Visit Number 1    Number of Visits 12    Date for PT Re-Evaluation 01/05/23    PT Start Time 1105    PT Stop Time 1145    PT Time Calculation (min) 40 min    Activity Tolerance Patient tolerated treatment well    Behavior During Therapy [facility] for tasks assessed/performed             Past Medical History:  Diagnosis Date   Anhedonia    Anxiety    Arteriosclerosis of carotid artery 03/19/2019   Less than 50%   Chicken pox    Hematuria 12/19/2017   History of fainting spells of unknown cause    Hyperlipidemia    Hypertension    Memory loss    prioor PCP records indicate neuro referral was made   Menopause    Osteoporosis    Rotator cuff (capsule) sprain    R>L   Syncope 09/2018   Vitamin D deficiency    Past Surgical History:  Procedure Laterality Date   CESAREAN SECTION  1980   ELECTROCARDIOGRAM  10/24/2018   SR. HR74, PR 167, QTC 406, NL-EKG   event monitor  01/10/2019   Predominant normal sinus rhythm.  HR 54-1 38.  Average HR 75.  No atrial fib.  Rare ectopic beats.  5 beat episode of nonsustained ventricular tachycardia x1.  No bradycardia arrhythmias.  No pauses.  Normal sinus rhythm without ectopy.   Image: CT chest  10/25/2018   normal- r/o PE   TRANSTHORACIC ECHOCARDIOGRAM  11/26/2018   EF 60-65%.  Normal study.  Completed presyncope.   US CAROTID DOPPLER BILATERAL ([facility] HX)  10/24/2018   Arteriolosclerosis.  Findings consistent with less than 50% stenosis.  Bilateral vertebral blood flow demonstrated.   Patient Active Problem List   Diagnosis Date Noted   FH: Alzheimer's disease (mother and 6 sibs) 11/10/2022   Sensorineural hearing loss, bilateral 04/19/2022   Lumbar pain 07/30/2021   Anxiousness 02/12/2021   Neck pain 08/12/2020    Osteoporosis 03/19/2019   Benign paroxysmal positional vertigo 02/21/2019   Hyperlipidemia    Hypertension    Syncope 09/2018    PCP: Natalia Leatherwood, DO   REFERRING PROVIDER: Rodolph Bong, MD  REFERRING DIAG: M54.50 (ICD-10-CM) - Lumbar pain   Rationale for Evaluation and Treatment: Rehabilitation  THERAPY DIAG:  Other low back pain  Muscle weakness (generalized)  ONSET DATE: chronic x 4 years worsening.   SUBJECTIVE:  SUBJECTIVE STATEMENT: Back pain has been going on for a while, don't know how long, I was in denial, I moved here 4 years ago, noticed a difference, don't think its going to get better.  I rest, take breaks, tylenol, dr prescribed diclofenac 2x day.    PERTINENT HISTORY:  HTN, osteoporosis, chronic LBP, hx C-section,   PAIN:  Are you having pain? Yes: NPRS scale: 2-8/10 Pain location: across low back Pain description: constant sore Aggravating factors: varies, usually standing and walking even short periods Relieving factors: sitting or laying down  PRECAUTIONS: None  WEIGHT BEARING RESTRICTIONS: No  FALLS:  Has patient fallen in last 6 months? No  LIVING ENVIRONMENT: Lives with: lives with their spouse Lives in: House/apartment Stairs: Yes: External: 1 steps; none Has following equipment at home: Grab bars  OCCUPATION: retired   PLOF: Independent  PATIENT GOALS: "bring me up as far as you can"  NEXT MD VISIT: 01/17/23  OBJECTIVE:   DIAGNOSTIC FINDINGS:  11/10/22 DG Lumbar spine  IMPRESSION: Minimal lower lumbar facet arthropathy for age. No acute osseous findings or malalignment.  PATIENT SURVEYS:  Modified Oswestry 12/50 = 24% disability    SCREENING FOR RED FLAGS: Bowel or bladder incontinence: No Spinal tumors: No Cauda equina syndrome:  No Compression fracture: No Abdominal aneurysm: No  COGNITION: Overall cognitive status: Within functional limits for tasks assessed     SENSATION: WFL  MUSCLE LENGTH: Hamstrings: moderate tightness bil  POSTURE: No Significant postural limitations  PALPATION: No tenderness with palpation in low back or glutes  LUMBAR ROM:   AROM eval  Flexion To mid shin  Extension WNL, "feels good"  Right lateral flexion To knee  Left lateral flexion To knee  Right rotation WNL  Left rotation WNL   (Blank rows = not tested)  LOWER EXTREMITY ROM:   good hip mobility bil, symmetric and pain free.    LOWER EXTREMITY MMT:    MMT Right eval Left eval  Hip flexion 4 4  Hip extension    Hip abduction 4+ 4+  Hip adduction 5 5  Knee flexion 4+ 4+  Knee extension 5 5  Ankle dorsiflexion 5 5  Ankle plantarflexion     (Blank rows = not tested)  LUMBAR SPECIAL TESTS:  Straight leg raise test: Negative, FABER test: Negative, and Long sit test: Negative Left leg 1/2 inch shorter than Right   FUNCTIONAL TESTS:  5 times sit to stand: 14 seconds without UE assist.   GAIT: Distance walked: 44' Assistive device utilized: None Level of assistance: Complete Independence Comments: no significant deviation  TODAY'S TREATMENT:                                                                                                                              DATE:   11/24/2022 Self Care: Education on findings, POC, importance of strengthening core for stabilization, 2 layers of 1/4" inch cork full length inserts placed in L shoe  to correct LLD, instructed to remove if any discomfort or pain in foot or LE.      PATIENT EDUCATION:  Education details: see above Person educated: Patient Education method: Explanation Education comprehension: verbalized understanding  HOME EXERCISE PROGRAM: TBD  ASSESSMENT:  CLINICAL IMPRESSION: Patient is a 72 y.o. female who was seen today for physical  therapy evaluation and treatment for chronic lumbar pain.  She reports chronic pain gradually worsening, increasing with standing and walking, limiting ability to perform ADLs like cooking or even washing hair.  She demonstrates core/hip weakness and leg length discrepancy, both of which may be contributing to her back pain.  Added cork insert to left shoe today.  [name] would benefit from skilled physical therapy to decrease low back pain, improve core strength, and improve activity tolerance.      OBJECTIVE IMPAIRMENTS: decreased activity tolerance, decreased endurance, difficulty walking, decreased strength, impaired flexibility, and pain.   ACTIVITY LIMITATIONS: carrying, lifting, standing, squatting, bathing, dressing, and locomotion level  PARTICIPATION LIMITATIONS: meal prep, cleaning, laundry, shopping, community activity, and yard work  PERSONAL FACTORS: Time since onset of injury/illness/exacerbation and 1-2 comorbidities: osteoporosis, chronic low back pain  are also affecting patient's functional outcome.   REHAB POTENTIAL: Good  CLINICAL DECISION MAKING: Stable/uncomplicated  EVALUATION COMPLEXITY: Low   GOALS: Goals reviewed with patient? Yes  SHORT TERM GOALS: Target date: 12/15/2022   Patient will be independent with initial HEP.  Baseline: needs Goal status: INITIAL   LONG TERM GOALS: Target date: 01/05/2023    Patient will be independent with advanced/ongoing HEP to improve outcomes and carryover.  Baseline:  Goal status: INITIAL  2.  Patient will report 75% improvement in low back pain to improve QOL.  Baseline:  Goal status: INITIAL  3. Patient will demonstrate improved functional strength as demonstrated by 5/5 hip strength. Baseline: see objective Goal status: INITIAL  4.  Patient will report 6 points improvement on modified Oswestry to demonstrate improved functional ability.  Baseline: 12/50 Goal status: INITIAL   5.  Patient will tolerate 15  min of standing to cook. Baseline: increases LBP, has to take breaks Goal status: INITIAL  7.  Patient will be able to walk for 30 min without increased pain for exercise.  Baseline: increases pain Goal status: INITIAL   PLAN:  PT FREQUENCY: 1-2x/week  PT DURATION: 6 weeks  PLANNED INTERVENTIONS: Therapeutic exercises, Therapeutic activity, Neuromuscular re-education, Balance training, Gait training, Patient/Family education, Self Care, Joint mobilization, Stair training, Orthotic/Fit training, Dry Needling, Electrical stimulation, Spinal manipulation, Spinal mobilization, Cryotherapy, Moist heat, Taping, Traction, Ultrasound, Manual therapy, and Re-evaluation.  PLAN FOR NEXT SESSION: focus on core strengthening exercises.    Jena Gauss, PT, DPT 11/24/2022, 12:23 PM

## 2022-11-24 ENCOUNTER — Encounter: Payer: Self-pay | Admitting: Physical Therapy

## 2022-11-24 ENCOUNTER — Ambulatory Visit: Payer: Medicare Other | Attending: Family Medicine | Admitting: Physical Therapy

## 2022-11-24 DIAGNOSIS — M5459 Other low back pain: Secondary | ICD-10-CM | POA: Diagnosis present

## 2022-11-24 DIAGNOSIS — M6281 Muscle weakness (generalized): Secondary | ICD-10-CM | POA: Insufficient documentation

## 2022-11-24 DIAGNOSIS — M545 Low back pain, unspecified: Secondary | ICD-10-CM | POA: Diagnosis not present

## 2022-11-29 ENCOUNTER — Encounter: Payer: Self-pay | Admitting: Physical Therapy

## 2022-11-29 ENCOUNTER — Ambulatory Visit: Payer: Medicare Other | Attending: Family Medicine | Admitting: Physical Therapy

## 2022-11-29 DIAGNOSIS — M5459 Other low back pain: Secondary | ICD-10-CM | POA: Insufficient documentation

## 2022-11-29 DIAGNOSIS — M6281 Muscle weakness (generalized): Secondary | ICD-10-CM | POA: Insufficient documentation

## 2022-11-29 NOTE — Therapy (Signed)
OUTPATIENT PHYSICAL THERAPY TREATMENT   Patient Name: Denise Rios MRN: [id number] DOB:30-Dec-1950, 72 y.o., female Today's Date: 11/29/2022  END OF SESSION:  PT End of Session - 11/29/22 1104     Visit Number 2    Number of Visits 12    Date for PT Re-Evaluation 01/05/23    PT Start Time 1103    PT Stop Time 1145    PT Time Calculation (min) 42 min    Activity Tolerance Patient tolerated treatment well    Behavior During Therapy [facility] for tasks assessed/performed             Past Medical History:  Diagnosis Date   Anhedonia    Anxiety    Arteriosclerosis of carotid artery 03/19/2019   Less than 50%   Chicken pox    Hematuria 12/19/2017   History of fainting spells of unknown cause    Hyperlipidemia    Hypertension    Memory loss    prioor PCP records indicate neuro referral was made   Menopause    Osteoporosis    Rotator cuff (capsule) sprain    R>L   Syncope 09/2018   Vitamin D deficiency    Past Surgical History:  Procedure Laterality Date   CESAREAN SECTION  1980   ELECTROCARDIOGRAM  10/24/2018   SR. HR74, PR 167, QTC 406, NL-EKG   event monitor  01/10/2019   Predominant normal sinus rhythm.  HR 54-1 38.  Average HR 75.  No atrial fib.  Rare ectopic beats.  5 beat episode of nonsustained ventricular tachycardia x1.  No bradycardia arrhythmias.  No pauses.  Normal sinus rhythm without ectopy.   Image: CT chest  10/25/2018   normal- r/o PE   TRANSTHORACIC ECHOCARDIOGRAM  11/26/2018   EF 60-65%.  Normal study.  Completed presyncope.   US CAROTID DOPPLER BILATERAL ([facility] HX)  10/24/2018   Arteriolosclerosis.  Findings consistent with less than 50% stenosis.  Bilateral vertebral blood flow demonstrated.   Patient Active Problem List   Diagnosis Date Noted   FH: Alzheimer's disease (mother and 6 sibs) 11/10/2022   Sensorineural hearing loss, bilateral 04/19/2022   Lumbar pain 07/30/2021   Anxiousness 02/12/2021   Neck pain 08/12/2020   Osteoporosis  03/19/2019   Benign paroxysmal positional vertigo 02/21/2019   Hyperlipidemia    Hypertension    Syncope 09/2018    PCP: Natalia Leatherwood, DO   REFERRING PROVIDER: Rodolph Bong, MD  REFERRING DIAG: M54.50 (ICD-10-CM) - Lumbar pain   Rationale for Evaluation and Treatment: Rehabilitation  THERAPY DIAG:  Other low back pain  Muscle weakness (generalized)  ONSET DATE: chronic x 4 years worsening.   SUBJECTIVE:  SUBJECTIVE STATEMENT: Doesn't hurt unless I do stuff, haven't done anything yet today.  Hasn't been wearing shoes so inserts haven't really helped.    PERTINENT HISTORY:  HTN, osteoporosis, chronic LBP, hx C-section,   PAIN:  Are you having pain? Yes: NPRS scale: 2/10 Pain location: across low back Pain description: constant sore Aggravating factors: varies, usually standing and walking even short periods Relieving factors: sitting or laying down  PRECAUTIONS: None  WEIGHT BEARING RESTRICTIONS: No  FALLS:  Has patient fallen in last 6 months? No  LIVING ENVIRONMENT: Lives with: lives with their spouse Lives in: House/apartment Stairs: Yes: External: 1 steps; none Has following equipment at home: Grab bars  OCCUPATION: retired   PLOF: Independent  PATIENT GOALS: "bring me up as far as you can"  NEXT MD VISIT: 01/17/23  OBJECTIVE:   DIAGNOSTIC FINDINGS:  11/10/22 DG Lumbar spine  IMPRESSION: Minimal lower lumbar facet arthropathy for age. No acute osseous findings or malalignment.  PATIENT SURVEYS:  Modified Oswestry 12/50 = 24% disability    SCREENING FOR RED FLAGS: Bowel or bladder incontinence: No Spinal tumors: No Cauda equina syndrome: No Compression fracture: No Abdominal aneurysm: No  COGNITION: Overall cognitive status: Within functional limits for  tasks assessed     SENSATION: WFL  MUSCLE LENGTH: Hamstrings: moderate tightness bil  POSTURE: No Significant postural limitations  PALPATION: No tenderness with palpation in low back or glutes  LUMBAR ROM:   AROM eval  Flexion To mid shin  Extension WNL, "feels good"  Right lateral flexion To knee  Left lateral flexion To knee  Right rotation WNL  Left rotation WNL   (Blank rows = not tested)  LOWER EXTREMITY ROM:   good hip mobility bil, symmetric and pain free.    LOWER EXTREMITY MMT:    MMT Right eval Left eval  Hip flexion 4 4  Hip extension    Hip abduction 4+ 4+  Hip adduction 5 5  Knee flexion 4+ 4+  Knee extension 5 5  Ankle dorsiflexion 5 5  Ankle plantarflexion     (Blank rows = not tested)  LUMBAR SPECIAL TESTS:  Straight leg raise test: Negative, FABER test: Negative, and Long sit test: Negative Left leg 1/2 inch shorter than Right   FUNCTIONAL TESTS:  5 times sit to stand: 14 seconds without UE assist.   GAIT: Distance walked: 80' Assistive device utilized: None Level of assistance: Complete Independence Comments: no significant deviation  TODAY'S TREATMENT:                                                                                                                              DATE:   11/29/2022 Therapeutic Exercise: to improve strength and mobility.  Demo, verbal and tactile cues throughout for technique. PPT x 15 with 5 sec hold  PPT with marching  Bridges 2 x 10  LTR SLR 2 x 10 bil  Clams 2 x 10 bil  - modified  to wrap top leg around achilles and roll belly button to table to isolate glutes Prone leg extensions 2 x 10 bil   11/24/2022 Self Care: Education on findings, POC, importance of strengthening core for stabilization, 2 layers of 1/4" inch cork full length inserts placed in L shoe to correct LLD, instructed to remove if any discomfort or pain in foot or LE.      PATIENT EDUCATION:  Education details: HEP Person  educated: Patient Education method: Programmer, multimedia, Facilities manager, Verbal cues, and Handouts Education comprehension: verbalized understanding and returned demonstration  HOME EXERCISE PROGRAM: Access Code: [email or web address] URL: [email or web address] Date: 11/29/2022 Prepared by: Harrie Foreman  Exercises - Supine Posterior Pelvic Tilt  - 1 x daily - 7 x weekly - 1 sets - 10 reps - 5 sec hold - Supine Bridge  - 1 x daily - 7 x weekly - 2-3 sets - 10 reps - Active Straight Leg Raise with Quad Set  - 1 x daily - 7 x weekly - 2-3 sets - 10 reps - Clamshell  - 1 x daily - 7 x weekly - 2-3 sets - 10 reps - Beginner Prone Single Leg Raise  - 1 x daily - 7 x weekly - 2-3 sets - 10 reps  ASSESSMENT:  CLINICAL IMPRESSION: Today focused on establishing HEP for core strengthening, Pat tolerated exercises well without complaint of increased LBP or soreness afterwards. [name] continues to demonstrate potential for improvement and would benefit from continued skilled therapy to address impairments.        OBJECTIVE IMPAIRMENTS: decreased activity tolerance, decreased endurance, difficulty walking, decreased strength, impaired flexibility, and pain.   ACTIVITY LIMITATIONS: carrying, lifting, standing, squatting, bathing, dressing, and locomotion level  PARTICIPATION LIMITATIONS: meal prep, cleaning, laundry, shopping, community activity, and yard work  PERSONAL FACTORS: Time since onset of injury/illness/exacerbation and 1-2 comorbidities: osteoporosis, chronic low back pain  are also affecting patient's functional outcome.   REHAB POTENTIAL: Good  CLINICAL DECISION MAKING: Stable/uncomplicated  EVALUATION COMPLEXITY: Low   GOALS: Goals reviewed with patient? Yes  SHORT TERM GOALS: Target date: 12/15/2022   Patient will be independent with initial HEP.  Baseline: needs Goal status: IN PROGRESS 11/29/22- given   LONG TERM GOALS: Target date: 01/05/2023    Patient will be  independent with advanced/ongoing HEP to improve outcomes and carryover.  Baseline:  Goal status: IN PROGRESS  2.  Patient will report 75% improvement in low back pain to improve QOL.  Baseline:  Goal status: IN PROGRESS  3. Patient will demonstrate improved functional strength as demonstrated by 5/5 hip strength. Baseline: see objective Goal status: IN PROGRESS  4.  Patient will report 6 points improvement on modified Oswestry to demonstrate improved functional ability.  Baseline: 12/50 Goal status: IN PROGRESS   5.  Patient will tolerate 15 min of standing to cook. Baseline: increases LBP, has to take breaks Goal status: IN PROGRESS  7.  Patient will be able to walk for 30 min without increased pain for exercise.  Baseline: increases pain Goal status: IN PROGRESS   PLAN:  PT FREQUENCY: 1-2x/week  PT DURATION: 6 weeks  PLANNED INTERVENTIONS: Therapeutic exercises, Therapeutic activity, Neuromuscular re-education, Balance training, Gait training, Patient/Family education, Self Care, Joint mobilization, Stair training, Orthotic/Fit training, Dry Needling, Electrical stimulation, Spinal manipulation, Spinal mobilization, Cryotherapy, Moist heat, Taping, Traction, Ultrasound, Manual therapy, and Re-evaluation.  PLAN FOR NEXT SESSION: focus on core strengthening exercises.    Jena Gauss, PT, DPT 11/29/2022, 11:47 AM

## 2022-12-05 ENCOUNTER — Other Ambulatory Visit: Payer: Self-pay | Admitting: Family Medicine

## 2022-12-06 ENCOUNTER — Ambulatory Visit: Payer: Medicare Other | Admitting: Physical Therapy

## 2022-12-06 ENCOUNTER — Encounter: Payer: Self-pay | Admitting: Physical Therapy

## 2022-12-06 DIAGNOSIS — M6281 Muscle weakness (generalized): Secondary | ICD-10-CM

## 2022-12-06 DIAGNOSIS — M5459 Other low back pain: Secondary | ICD-10-CM

## 2022-12-06 NOTE — Therapy (Signed)
OUTPATIENT PHYSICAL THERAPY TREATMENT   Patient Name: Denise Rios MRN: [id number] DOB:[birth year], 72 y.o., female Today's Date: 12/06/2022  END OF SESSION:  PT End of Session - 12/06/22 1018     Visit Number 3    Number of Visits 12    Date for PT Re-Evaluation 01/05/23    PT Start Time 1020    PT Stop Time 1058    PT Time Calculation (min) 38 min    Activity Tolerance Patient tolerated treatment well    Behavior During Therapy [facility] for tasks assessed/performed             Past Medical History:  Diagnosis Date   Anhedonia    Anxiety    Arteriosclerosis of carotid artery 03/19/2019   Less than 50%   Chicken pox    Hematuria 12/19/2017   History of fainting spells of unknown cause    Hyperlipidemia    Hypertension    Memory loss    prioor PCP records indicate neuro referral was made   Menopause    Osteoporosis    Rotator cuff (capsule) sprain    R>L   Syncope 09/2018   Vitamin D deficiency    Past Surgical History:  Procedure Laterality Date   CESAREAN SECTION  1980   ELECTROCARDIOGRAM  10/24/2018   SR. HR74, PR 167, QTC 406, NL-EKG   event monitor  01/10/2019   Predominant normal sinus rhythm.  HR 54-1 38.  Average HR 75.  No atrial fib.  Rare ectopic beats.  5 beat episode of nonsustained ventricular tachycardia x1.  No bradycardia arrhythmias.  No pauses.  Normal sinus rhythm without ectopy.   Image: CT chest  10/25/2018   normal- r/o PE   TRANSTHORACIC ECHOCARDIOGRAM  11/26/2018   EF 60-65%.  Normal study.  Completed presyncope.   US CAROTID DOPPLER BILATERAL ([facility] HX)  10/24/2018   Arteriolosclerosis.  Findings consistent with less than 50% stenosis.  Bilateral vertebral blood flow demonstrated.   Patient Active Problem List   Diagnosis Date Noted   FH: Alzheimer's disease (mother and 6 sibs) 11/10/2022   Sensorineural hearing loss, bilateral 04/19/2022   Lumbar pain 07/30/2021   Anxiousness 02/12/2021   Neck pain 08/12/2020   Osteoporosis  03/19/2019   Benign paroxysmal positional vertigo 02/21/2019   Hyperlipidemia    Hypertension    Syncope 09/2018    PCP: Natalia Leatherwood, DO   REFERRING PROVIDER: Rodolph Bong, MD  REFERRING DIAG: M54.50 (ICD-10-CM) - Lumbar pain   Rationale for Evaluation and Treatment: Rehabilitation  THERAPY DIAG:  Other low back pain  Muscle weakness (generalized)  ONSET DATE: chronic x 4 years worsening.   SUBJECTIVE:  SUBJECTIVE STATEMENT: "So far so good"  Hasn't been doing her exercises, having trouble remembering to do them, doesn't have any kind of routine.   PERTINENT HISTORY:  HTN, osteoporosis, chronic LBP, hx C-section,   PAIN:  Are you having pain? Yes: NPRS scale: 0/10 Pain location: across low back Pain description: constant sore Aggravating factors: varies, usually standing and walking even short periods Relieving factors: sitting or laying down  PRECAUTIONS: None  WEIGHT BEARING RESTRICTIONS: No  FALLS:  Has patient fallen in last 6 months? No  LIVING ENVIRONMENT: Lives with: lives with their spouse Lives in: House/apartment Stairs: Yes: External: 1 steps; none Has following equipment at home: Grab bars  OCCUPATION: retired   PLOF: Independent  PATIENT GOALS: "bring me up as far as you can"  NEXT MD VISIT: 01/17/23  OBJECTIVE:   DIAGNOSTIC FINDINGS:  11/10/22 DG Lumbar spine  IMPRESSION: Minimal lower lumbar facet arthropathy for age. No acute osseous findings or malalignment.  PATIENT SURVEYS:  Modified Oswestry 12/50 = 24% disability    SCREENING FOR RED FLAGS: Bowel or bladder incontinence: No Spinal tumors: No Cauda equina syndrome: No Compression fracture: No Abdominal aneurysm: No  COGNITION: Overall cognitive status: Within functional limits for tasks  assessed     SENSATION: WFL  MUSCLE LENGTH: Hamstrings: moderate tightness bil  POSTURE: No Significant postural limitations  PALPATION: No tenderness with palpation in low back or glutes  LUMBAR ROM:   AROM eval  Flexion To mid shin  Extension WNL, "feels good"  Right lateral flexion To knee  Left lateral flexion To knee  Right rotation WNL  Left rotation WNL   (Blank rows = not tested)  LOWER EXTREMITY ROM:   good hip mobility bil, symmetric and pain free.    LOWER EXTREMITY MMT:    MMT Right eval Left eval  Hip flexion 4 4  Hip extension    Hip abduction 4+ 4+  Hip adduction 5 5  Knee flexion 4+ 4+  Knee extension 5 5  Ankle dorsiflexion 5 5  Ankle plantarflexion     (Blank rows = not tested)  LUMBAR SPECIAL TESTS:  Straight leg raise test: Negative, FABER test: Negative, and Long sit test: Negative Left leg 1/2 inch shorter than Right   FUNCTIONAL TESTS:  5 times sit to stand: 14 seconds without UE assist.   GAIT: Distance walked: 66' Assistive device utilized: None Level of assistance: Complete Independence Comments: no significant deviation  TODAY'S TREATMENT:                                                                                                                              DATE:   12/06/2022 Therapeutic Exercise: to improve strength and mobility.  Demo, verbal and tactile cues throughout for technique. Nustep L4 x 6 min  LTR x 10 PPT x 10 Bridge 2 x 10  SLR 2 x 10 each S/L modified clams 2 x 10 each side Prone leg  extensions 2 x 10 each  11/29/2022 Therapeutic Exercise: to improve strength and mobility.  Demo, verbal and tactile cues throughout for technique. PPT x 15 with 5 sec hold  PPT with marching  Bridges 2 x 10  LTR SLR 2 x 10 bil  Clams 2 x 10 bil  - modified to wrap top leg around achilles and roll belly button to table to isolate glutes Prone leg extensions 2 x 10 bil   11/24/2022 Self Care: Education on findings, POC,  importance of strengthening core for stabilization, 2 layers of 1/4" inch cork full length inserts placed in L shoe to correct LLD, instructed to remove if any discomfort or pain in foot or LE.      PATIENT EDUCATION:  Education details: HEP review Person educated: Patient Education method: Explanation and Verbal cues Education comprehension: verbalized understanding and returned demonstration  HOME EXERCISE PROGRAM: Access Code: [email or web address] URL: [email or web address] Date: 11/29/2022 Prepared by: Harrie Foreman  Exercises - Supine Posterior Pelvic Tilt  - 1 x daily - 7 x weekly - 1 sets - 10 reps - 5 sec hold - Supine Bridge  - 1 x daily - 7 x weekly - 2-3 sets - 10 reps - Active Straight Leg Raise with Quad Set  - 1 x daily - 7 x weekly - 2-3 sets - 10 reps - Clamshell  - 1 x daily - 7 x weekly - 2-3 sets - 10 reps - Beginner Prone Single Leg Raise  - 1 x daily - 7 x weekly - 2-3 sets - 10 reps  ASSESSMENT:  CLINICAL IMPRESSION: Today reviewed HEP and discussed how to make it part of daily routine to improve compliance, emphasizing core of routine would only take 15 min to perform, can perform on bed if floor is uncomfortable.  [name] continues to demonstrate potential for improvement and would benefit from continued skilled therapy to address impairments.        OBJECTIVE IMPAIRMENTS: decreased activity tolerance, decreased endurance, difficulty walking, decreased strength, impaired flexibility, and pain.   ACTIVITY LIMITATIONS: carrying, lifting, standing, squatting, bathing, dressing, and locomotion level  PARTICIPATION LIMITATIONS: meal prep, cleaning, laundry, shopping, community activity, and yard work  PERSONAL FACTORS: Time since onset of injury/illness/exacerbation and 1-2 comorbidities: osteoporosis, chronic low back pain  are also affecting patient's functional outcome.   REHAB POTENTIAL: Good  CLINICAL DECISION MAKING:  Stable/uncomplicated  EVALUATION COMPLEXITY: Low   GOALS: Goals reviewed with patient? Yes  SHORT TERM GOALS: Target date: 12/15/2022   Patient will be independent with initial HEP.  Baseline: needs Goal status: IN PROGRESS 11/29/22- given   LONG TERM GOALS: Target date: 01/05/2023    Patient will be independent with advanced/ongoing HEP to improve outcomes and carryover.  Baseline:  Goal status: IN PROGRESS  2.  Patient will report 75% improvement in low back pain to improve QOL.  Baseline:  Goal status: IN PROGRESS  3. Patient will demonstrate improved functional strength as demonstrated by 5/5 hip strength. Baseline: see objective Goal status: IN PROGRESS  4.  Patient will report 6 points improvement on modified Oswestry to demonstrate improved functional ability.  Baseline: 12/50 Goal status: IN PROGRESS   5.  Patient will tolerate 15 min of standing to cook. Baseline: increases LBP, has to take breaks Goal status: IN PROGRESS  7.  Patient will be able to walk for 30 min without increased pain for exercise.  Baseline: increases pain Goal status: IN PROGRESS   PLAN:  PT FREQUENCY:  1-2x/week  PT DURATION: 6 weeks  PLANNED INTERVENTIONS: Therapeutic exercises, Therapeutic activity, Neuromuscular re-education, Balance training, Gait training, Patient/Family education, Self Care, Joint mobilization, Stair training, Orthotic/Fit training, Dry Needling, Electrical stimulation, Spinal manipulation, Spinal mobilization, Cryotherapy, Moist heat, Taping, Traction, Ultrasound, Manual therapy, and Re-evaluation.  PLAN FOR NEXT SESSION: focus on core strengthening exercises.    Jena Gauss, PT, DPT 12/06/2022, 11:06 AM

## 2022-12-09 ENCOUNTER — Ambulatory Visit: Payer: Medicare Other

## 2022-12-09 DIAGNOSIS — M5459 Other low back pain: Secondary | ICD-10-CM | POA: Diagnosis not present

## 2022-12-09 DIAGNOSIS — M6281 Muscle weakness (generalized): Secondary | ICD-10-CM

## 2022-12-09 NOTE — Therapy (Signed)
OUTPATIENT PHYSICAL THERAPY TREATMENT   Patient Name: Denise Rios MRN: [id number] DOB:[birth year], 72 y.o., female Today's Date: 12/09/2022  END OF SESSION:  PT End of Session - 12/09/22 1020     Visit Number 4    Number of Visits 12    Date for PT Re-Evaluation 01/05/23    PT Start Time 1018    PT Stop Time 1102    PT Time Calculation (min) 44 min    Activity Tolerance Patient tolerated treatment well    Behavior During Therapy [facility] for tasks assessed/performed              Past Medical History:  Diagnosis Date   Anhedonia    Anxiety    Arteriosclerosis of carotid artery 03/19/2019   Less than 50%   Chicken pox    Hematuria 12/19/2017   History of fainting spells of unknown cause    Hyperlipidemia    Hypertension    Memory loss    prioor PCP records indicate neuro referral was made   Menopause    Osteoporosis    Rotator cuff (capsule) sprain    R>L   Syncope 09/2018   Vitamin D deficiency    Past Surgical History:  Procedure Laterality Date   CESAREAN SECTION  1980   ELECTROCARDIOGRAM  10/24/2018   SR. HR74, PR 167, QTC 406, NL-EKG   event monitor  01/10/2019   Predominant normal sinus rhythm.  HR 54-1 38.  Average HR 75.  No atrial fib.  Rare ectopic beats.  5 beat episode of nonsustained ventricular tachycardia x1.  No bradycardia arrhythmias.  No pauses.  Normal sinus rhythm without ectopy.   Image: CT chest  10/25/2018   normal- r/o PE   TRANSTHORACIC ECHOCARDIOGRAM  11/26/2018   EF 60-65%.  Normal study.  Completed presyncope.   US CAROTID DOPPLER BILATERAL ([facility] HX)  10/24/2018   Arteriolosclerosis.  Findings consistent with less than 50% stenosis.  Bilateral vertebral blood flow demonstrated.   Patient Active Problem List   Diagnosis Date Noted   FH: Alzheimer's disease (mother and 6 sibs) 11/10/2022   Sensorineural hearing loss, bilateral 04/19/2022   Lumbar pain 07/30/2021   Anxiousness 02/12/2021   Neck pain 08/12/2020   Osteoporosis  03/19/2019   Benign paroxysmal positional vertigo 02/21/2019   Hyperlipidemia    Hypertension    Syncope 09/2018    PCP: Natalia Leatherwood, DO   REFERRING PROVIDER: Rodolph Bong, MD  REFERRING DIAG: M54.50 (ICD-10-CM) - Lumbar pain   Rationale for Evaluation and Treatment: Rehabilitation  THERAPY DIAG:  Other low back pain  Muscle weakness (generalized)  ONSET DATE: chronic x 4 years worsening.   SUBJECTIVE:  SUBJECTIVE STATEMENT: "So far so good"   PERTINENT HISTORY:  HTN, osteoporosis, chronic LBP, hx C-section,   PAIN:  Are you having pain? Yes: NPRS scale: 0/10 Pain location: across low back Pain description: constant sore Aggravating factors: varies, usually standing and walking even short periods Relieving factors: sitting or laying down  PRECAUTIONS: None  WEIGHT BEARING RESTRICTIONS: No  FALLS:  Has patient fallen in last 6 months? No  LIVING ENVIRONMENT: Lives with: lives with their spouse Lives in: House/apartment Stairs: Yes: External: 1 steps; none Has following equipment at home: Grab bars  OCCUPATION: retired   PLOF: Independent  PATIENT GOALS: "bring me up as far as you can"  NEXT MD VISIT: 01/17/23  OBJECTIVE:   DIAGNOSTIC FINDINGS:  11/10/22 DG Lumbar spine  IMPRESSION: Minimal lower lumbar facet arthropathy for age. No acute osseous findings or malalignment.  PATIENT SURVEYS:  Modified Oswestry 12/50 = 24% disability    SCREENING FOR RED FLAGS: Bowel or bladder incontinence: No Spinal tumors: No Cauda equina syndrome: No Compression fracture: No Abdominal aneurysm: No  COGNITION: Overall cognitive status: Within functional limits for tasks assessed     SENSATION: WFL  MUSCLE LENGTH: Hamstrings: moderate tightness bil  POSTURE: No  Significant postural limitations  PALPATION: No tenderness with palpation in low back or glutes  LUMBAR ROM:   AROM eval  Flexion To mid shin  Extension WNL, "feels good"  Right lateral flexion To knee  Left lateral flexion To knee  Right rotation WNL  Left rotation WNL   (Blank rows = not tested)  LOWER EXTREMITY ROM:   good hip mobility bil, symmetric and pain free.    LOWER EXTREMITY MMT:    MMT Right eval Left eval  Hip flexion 4 4  Hip extension    Hip abduction 4+ 4+  Hip adduction 5 5  Knee flexion 4+ 4+  Knee extension 5 5  Ankle dorsiflexion 5 5  Ankle plantarflexion     (Blank rows = not tested)  LUMBAR SPECIAL TESTS:  Straight leg raise test: Negative, FABER test: Negative, and Long sit test: Negative Left leg 1/2 inch shorter than Right   FUNCTIONAL TESTS:  5 times sit to stand: 14 seconds without UE assist.   GAIT: Distance walked: 6' Assistive device utilized: None Level of assistance: Complete Independence Comments: no significant deviation  TODAY'S TREATMENT:                                                                                                                              DATE:  12/09/2022 Therapeutic Exercise: to improve strength and mobility.  Demo, verbal and tactile cues throughout for technique. Nustep L5 x 6 min PPT x 10  LTR x 10  Supine hip ball squeeze with PPT x 10  Bridge with ball squeeze 2x10 Prone hip extension 2x10 bil S/L hip abduction x 10 bil Supine march with TrA 10x5" bil Supine trA bicycles x 10  12/06/2022  Therapeutic Exercise: to improve strength and mobility.  Demo, verbal and tactile cues throughout for technique. Nustep L4 x 6 min  LTR x 10 PPT x 10 Bridge 2 x 10  SLR 2 x 10 each S/L modified clams 2 x 10 each side Prone leg extensions 2 x 10 each  11/29/2022 Therapeutic Exercise: to improve strength and mobility.  Demo, verbal and tactile cues throughout for technique. PPT x 15 with 5 sec hold  PPT  with marching  Bridges 2 x 10  LTR SLR 2 x 10 bil  Clams 2 x 10 bil  - modified to wrap top leg around achilles and roll belly button to table to isolate glutes Prone leg extensions 2 x 10 bil   11/24/2022 Self Care: Education on findings, POC, importance of strengthening core for stabilization, 2 layers of 1/4" inch cork full length inserts placed in L shoe to correct LLD, instructed to remove if any discomfort or pain in foot or LE.      PATIENT EDUCATION:  Education details: HEP review Person educated: Patient Education method: Explanation and Verbal cues Education comprehension: verbalized understanding and returned demonstration  HOME EXERCISE PROGRAM: Access Code: [email or web address] URL: [email or web address] Date: 11/29/2022 Prepared by: Harrie Foreman  Exercises - Supine Posterior Pelvic Tilt  - 1 x daily - 7 x weekly - 1 sets - 10 reps - 5 sec hold - Supine Bridge  - 1 x daily - 7 x weekly - 2-3 sets - 10 reps - Active Straight Leg Raise with Quad Set  - 1 x daily - 7 x weekly - 2-3 sets - 10 reps - Clamshell  - 1 x daily - 7 x weekly - 2-3 sets - 10 reps - Beginner Prone Single Leg Raise  - 1 x daily - 7 x weekly - 2-3 sets - 10 reps  ASSESSMENT:  CLINICAL IMPRESSION: Pt with good response to treatment. Progressed core strengthening and hip strengthening. Cues were needed to avoid rotating pelvis with prone hip extension. Cues needed to keep core engaged with marches. [name] continues to demonstrate potential for improvement and would benefit from continued skilled therapy to address impairments.        OBJECTIVE IMPAIRMENTS: decreased activity tolerance, decreased endurance, difficulty walking, decreased strength, impaired flexibility, and pain.   ACTIVITY LIMITATIONS: carrying, lifting, standing, squatting, bathing, dressing, and locomotion level  PARTICIPATION LIMITATIONS: meal prep, cleaning, laundry, shopping, community activity, and yard  work  PERSONAL FACTORS: Time since onset of injury/illness/exacerbation and 1-2 comorbidities: osteoporosis, chronic low back pain  are also affecting patient's functional outcome.   REHAB POTENTIAL: Good  CLINICAL DECISION MAKING: Stable/uncomplicated  EVALUATION COMPLEXITY: Low   GOALS: Goals reviewed with patient? Yes  SHORT TERM GOALS: Target date: 12/15/2022   Patient will be independent with initial HEP.  Baseline: needs Goal status: IN PROGRESS 11/29/22- given   LONG TERM GOALS: Target date: 01/05/2023    Patient will be independent with advanced/ongoing HEP to improve outcomes and carryover.  Baseline:  Goal status: IN PROGRESS  2.  Patient will report 75% improvement in low back pain to improve QOL.  Baseline:  Goal status: IN PROGRESS  3. Patient will demonstrate improved functional strength as demonstrated by 5/5 hip strength. Baseline: see objective Goal status: IN PROGRESS  4.  Patient will report 6 points improvement on modified Oswestry to demonstrate improved functional ability.  Baseline: 12/50 Goal status: IN PROGRESS   5.  Patient will tolerate 15 min of standing to  cook. Baseline: increases LBP, has to take breaks Goal status: IN PROGRESS  7.  Patient will be able to walk for 30 min without increased pain for exercise.  Baseline: increases pain Goal status: IN PROGRESS   PLAN:  PT FREQUENCY: 1-2x/week  PT DURATION: 6 weeks  PLANNED INTERVENTIONS: Therapeutic exercises, Therapeutic activity, Neuromuscular re-education, Balance training, Gait training, Patient/Family education, Self Care, Joint mobilization, Stair training, Orthotic/Fit training, Dry Needling, Electrical stimulation, Spinal manipulation, Spinal mobilization, Cryotherapy, Moist heat, Taping, Traction, Ultrasound, Manual therapy, and Re-evaluation.  PLAN FOR NEXT SESSION: focus on core strengthening exercises.    Darleene Cleaver, PTA 12/09/2022, 11:03 AM

## 2022-12-12 ENCOUNTER — Ambulatory Visit: Payer: Medicare Other

## 2022-12-12 DIAGNOSIS — M6281 Muscle weakness (generalized): Secondary | ICD-10-CM

## 2022-12-12 DIAGNOSIS — M5459 Other low back pain: Secondary | ICD-10-CM

## 2022-12-12 NOTE — Therapy (Signed)
OUTPATIENT PHYSICAL THERAPY TREATMENT   Patient Name: Denise Rios MRN: [id number] DOB:1951/04/21, 72 y.o., female Today's Date: 12/12/2022  END OF SESSION:  PT End of Session - 12/12/22 1027     Visit Number 5    Number of Visits 12    Date for PT Re-Evaluation 01/05/23    PT Start Time 1017    PT Stop Time 1102    PT Time Calculation (min) 45 min    Activity Tolerance Patient tolerated treatment well    Behavior During Therapy [facility] for tasks assessed/performed               Past Medical History:  Diagnosis Date   Anhedonia    Anxiety    Arteriosclerosis of carotid artery 03/19/2019   Less than 50%   Chicken pox    Hematuria 12/19/2017   History of fainting spells of unknown cause    Hyperlipidemia    Hypertension    Memory loss    prioor PCP records indicate neuro referral was made   Menopause    Osteoporosis    Rotator cuff (capsule) sprain    R>L   Syncope 09/2018   Vitamin D deficiency    Past Surgical History:  Procedure Laterality Date   CESAREAN SECTION  1980   ELECTROCARDIOGRAM  10/24/2018   SR. HR74, PR 167, QTC 406, NL-EKG   event monitor  01/10/2019   Predominant normal sinus rhythm.  HR 54-1 38.  Average HR 75.  No atrial fib.  Rare ectopic beats.  5 beat episode of nonsustained ventricular tachycardia x1.  No bradycardia arrhythmias.  No pauses.  Normal sinus rhythm without ectopy.   Image: CT chest  10/25/2018   normal- r/o PE   TRANSTHORACIC ECHOCARDIOGRAM  11/26/2018   EF 60-65%.  Normal study.  Completed presyncope.   US CAROTID DOPPLER BILATERAL ([facility] HX)  10/24/2018   Arteriolosclerosis.  Findings consistent with less than 50% stenosis.  Bilateral vertebral blood flow demonstrated.   Patient Active Problem List   Diagnosis Date Noted   FH: Alzheimer's disease (mother and 6 sibs) 11/10/2022   Sensorineural hearing loss, bilateral 04/19/2022   Lumbar pain 07/30/2021   Anxiousness 02/12/2021   Neck pain 08/12/2020   Osteoporosis  03/19/2019   Benign paroxysmal positional vertigo 02/21/2019   Hyperlipidemia    Hypertension    Syncope 09/2018    PCP: Natalia Leatherwood, DO   REFERRING PROVIDER: Rodolph Bong, MD  REFERRING DIAG: M54.50 (ICD-10-CM) - Lumbar pain   Rationale for Evaluation and Treatment: Rehabilitation  THERAPY DIAG:  Other low back pain  Muscle weakness (generalized)  ONSET DATE: chronic x 4 years worsening.   SUBJECTIVE:  SUBJECTIVE STATEMENT: Pt reports doing more HEP exercises, breaking it up to three exercises in a day PERTINENT HISTORY:  HTN, osteoporosis, chronic LBP, hx C-section,   PAIN:  Are you having pain? Yes: NPRS scale: 0/10 Pain location: across low back Pain description: constant sore Aggravating factors: varies, usually standing and walking even short periods Relieving factors: sitting or laying down  PRECAUTIONS: None  WEIGHT BEARING RESTRICTIONS: No  FALLS:  Has patient fallen in last 6 months? No  LIVING ENVIRONMENT: Lives with: lives with their spouse Lives in: House/apartment Stairs: Yes: External: 1 steps; none Has following equipment at home: Grab bars  OCCUPATION: retired   PLOF: Independent  PATIENT GOALS: "bring me up as far as you can"  NEXT MD VISIT: 01/17/23  OBJECTIVE:   DIAGNOSTIC FINDINGS:  11/10/22 DG Lumbar spine  IMPRESSION: Minimal lower lumbar facet arthropathy for age. No acute osseous findings or malalignment.  PATIENT SURVEYS:  Modified Oswestry 12/50 = 24% disability    SCREENING FOR RED FLAGS: Bowel or bladder incontinence: No Spinal tumors: No Cauda equina syndrome: No Compression fracture: No Abdominal aneurysm: No  COGNITION: Overall cognitive status: Within functional limits for tasks assessed     SENSATION: WFL  MUSCLE  LENGTH: Hamstrings: moderate tightness bil  POSTURE: No Significant postural limitations  PALPATION: No tenderness with palpation in low back or glutes  LUMBAR ROM:   AROM eval  Flexion To mid shin  Extension WNL, "feels good"  Right lateral flexion To knee  Left lateral flexion To knee  Right rotation WNL  Left rotation WNL   (Blank rows = not tested)  LOWER EXTREMITY ROM:   good hip mobility bil, symmetric and pain free.    LOWER EXTREMITY MMT:    MMT Right eval Left eval  Hip flexion 4 4  Hip extension    Hip abduction 4+ 4+  Hip adduction 5 5  Knee flexion 4+ 4+  Knee extension 5 5  Ankle dorsiflexion 5 5  Ankle plantarflexion     (Blank rows = not tested)  LUMBAR SPECIAL TESTS:  Straight leg raise test: Negative, FABER test: Negative, and Long sit test: Negative Left leg 1/2 inch shorter than Right   FUNCTIONAL TESTS:  5 times sit to stand: 14 seconds without UE assist.   GAIT: Distance walked: 80' Assistive device utilized: None Level of assistance: Complete Independence Comments: no significant deviation  TODAY'S TREATMENT:                                                                                                                              DATE:  12/12/2022 Therapeutic Exercise: to improve strength and mobility.  Demo, verbal and tactile cues throughout for technique. BIKE L1x51min Supine PPT x 10 LTR x 10 each direction Supine trA bicycles x 10 - mod challenge to keep core engaged Supine march B with TrA 10x5" B hip flexion isometric with TrA 10x5"- TrA brace Standing hip abduction x  10 bil RTB Standing hip extension x 10 bil RTB Hip hike from 2' book x 10 bil  12/09/2022 Therapeutic Exercise: to improve strength and mobility.  Demo, verbal and tactile cues throughout for technique. Nustep L5 x 6 min PPT x 10  LTR x 10  Supine hip ball squeeze with PPT x 10  Bridge with ball squeeze 2x10 Prone hip extension 2x10 bil S/L hip abduction x  10 bil Supine march with TrA 10x5" bil Supine trA bicycles x 10  12/06/2022 Therapeutic Exercise: to improve strength and mobility.  Demo, verbal and tactile cues throughout for technique. Nustep L4 x 6 min  LTR x 10 PPT x 10 Bridge 2 x 10  SLR 2 x 10 each S/L modified clams 2 x 10 each side Prone leg extensions 2 x 10 each  11/29/2022 Therapeutic Exercise: to improve strength and mobility.  Demo, verbal and tactile cues throughout for technique. PPT x 15 with 5 sec hold  PPT with marching  Bridges 2 x 10  LTR SLR 2 x 10 bil  Clams 2 x 10 bil  - modified to wrap top leg around achilles and roll belly button to table to isolate glutes Prone leg extensions 2 x 10 bil   11/24/2022 Self Care: Education on findings, POC, importance of strengthening core for stabilization, 2 layers of 1/4" inch cork full length inserts placed in L shoe to correct LLD, instructed to remove if any discomfort or pain in foot or LE.      PATIENT EDUCATION:  Education details: HEP review Person educated: Patient Education method: Explanation and Verbal cues Education comprehension: verbalized understanding and returned demonstration  HOME EXERCISE PROGRAM: Access Code: [email or web address] URL: [email or web address] Date: 12/12/2022 Prepared by: Verta Ellen  Exercises - Supine Posterior Pelvic Tilt  - 1 x daily - 7 x weekly - 1 sets - 10 reps - 5 sec hold - Supine Bridge  - 1 x daily - 7 x weekly - 2-3 sets - 10 reps - Active Straight Leg Raise with Quad Set  - 1 x daily - 7 x weekly - 2-3 sets - 10 reps - Clamshell  - 1 x daily - 7 x weekly - 2-3 sets - 10 reps - Beginner Prone Single Leg Raise  - 1 x daily - 7 x weekly - 2-3 sets - 10 reps - Supine March  - 1 x daily - 7 x weekly - 2 sets - 10 reps - 5 sec hold - Isometric Dead Bug  - 1 x daily - 7 x weekly - 2 sets - 10 reps - 5 sec hold  ASSESSMENT:  CLINICAL IMPRESSION: Pt showed good response to the progression of exercises. She shows mod  difficulty keeping core engaged with some lumbar exercises with tactile and verbal cues required. I did progress to add more core stabilization exercises to HEP. [name] continues to demonstrate potential for improvement and would benefit from continued skilled therapy to address impairments.        OBJECTIVE IMPAIRMENTS: decreased activity tolerance, decreased endurance, difficulty walking, decreased strength, impaired flexibility, and pain.   ACTIVITY LIMITATIONS: carrying, lifting, standing, squatting, bathing, dressing, and locomotion level  PARTICIPATION LIMITATIONS: meal prep, cleaning, laundry, shopping, community activity, and yard work  PERSONAL FACTORS: Time since onset of injury/illness/exacerbation and 1-2 comorbidities: osteoporosis, chronic low back pain  are also affecting patient's functional outcome.   REHAB POTENTIAL: Good  CLINICAL DECISION MAKING: Stable/uncomplicated  EVALUATION COMPLEXITY: Low   GOALS: Goals  reviewed with patient? Yes  SHORT TERM GOALS: Target date: 12/15/2022   Patient will be independent with initial HEP.  Baseline: needs Goal status: IN PROGRESS 11/29/22- given   LONG TERM GOALS: Target date: 01/05/2023    Patient will be independent with advanced/ongoing HEP to improve outcomes and carryover.  Baseline:  Goal status: IN PROGRESS  2.  Patient will report 75% improvement in low back pain to improve QOL.  Baseline:  Goal status: IN PROGRESS  3. Patient will demonstrate improved functional strength as demonstrated by 5/5 hip strength. Baseline: see objective Goal status: IN PROGRESS  4.  Patient will report 6 points improvement on modified Oswestry to demonstrate improved functional ability.  Baseline: 12/50 Goal status: IN PROGRESS   5.  Patient will tolerate 15 min of standing to cook. Baseline: increases LBP, has to take breaks Goal status: IN PROGRESS  7.  Patient will be able to walk for 30 min without increased pain for  exercise.  Baseline: increases pain Goal status: IN PROGRESS   PLAN:  PT FREQUENCY: 1-2x/week  PT DURATION: 6 weeks  PLANNED INTERVENTIONS: Therapeutic exercises, Therapeutic activity, Neuromuscular re-education, Balance training, Gait training, Patient/Family education, Self Care, Joint mobilization, Stair training, Orthotic/Fit training, Dry Needling, Electrical stimulation, Spinal manipulation, Spinal mobilization, Cryotherapy, Moist heat, Taping, Traction, Ultrasound, Manual therapy, and Re-evaluation.  PLAN FOR NEXT SESSION: focus on core strengthening exercises.    Darleene Cleaver, PTA 12/12/2022, 11:03 AM

## 2022-12-15 ENCOUNTER — Ambulatory Visit: Payer: Medicare Other | Admitting: Physical Therapy

## 2022-12-15 ENCOUNTER — Encounter: Payer: Self-pay | Admitting: Physical Therapy

## 2022-12-15 DIAGNOSIS — M5459 Other low back pain: Secondary | ICD-10-CM | POA: Diagnosis not present

## 2022-12-15 DIAGNOSIS — M6281 Muscle weakness (generalized): Secondary | ICD-10-CM

## 2022-12-15 NOTE — Therapy (Signed)
OUTPATIENT PHYSICAL THERAPY TREATMENT   Patient Name: Denise Rios MRN: [id number] DOB:Jun 10, 1950, 72 y.o., female Today's Date: 12/15/2022  END OF SESSION:  PT End of Session - 12/15/22 0935     Visit Number 6    Number of Visits 12    Date for PT Re-Evaluation 01/05/23    PT Start Time 0933    PT Stop Time 1015    PT Time Calculation (min) 42 min    Activity Tolerance Patient tolerated treatment well    Behavior During Therapy [facility] for tasks assessed/performed               Past Medical History:  Diagnosis Date   Anhedonia    Anxiety    Arteriosclerosis of carotid artery 03/19/2019   Less than 50%   Chicken pox    Hematuria 12/19/2017   History of fainting spells of unknown cause    Hyperlipidemia    Hypertension    Memory loss    prioor PCP records indicate neuro referral was made   Menopause    Osteoporosis    Rotator cuff (capsule) sprain    R>L   Syncope 09/2018   Vitamin D deficiency    Past Surgical History:  Procedure Laterality Date   CESAREAN SECTION  1980   ELECTROCARDIOGRAM  10/24/2018   SR. HR74, PR 167, QTC 406, NL-EKG   event monitor  01/10/2019   Predominant normal sinus rhythm.  HR 54-1 38.  Average HR 75.  No atrial fib.  Rare ectopic beats.  5 beat episode of nonsustained ventricular tachycardia x1.  No bradycardia arrhythmias.  No pauses.  Normal sinus rhythm without ectopy.   Image: CT chest  10/25/2018   normal- r/o PE   TRANSTHORACIC ECHOCARDIOGRAM  11/26/2018   EF 60-65%.  Normal study.  Completed presyncope.   US CAROTID DOPPLER BILATERAL ([facility] HX)  10/24/2018   Arteriolosclerosis.  Findings consistent with less than 50% stenosis.  Bilateral vertebral blood flow demonstrated.   Patient Active Problem List   Diagnosis Date Noted   FH: Alzheimer's disease (mother and 6 sibs) 11/10/2022   Sensorineural hearing loss, bilateral 04/19/2022   Lumbar pain 07/30/2021   Anxiousness 02/12/2021   Neck pain 08/12/2020   Osteoporosis  03/19/2019   Benign paroxysmal positional vertigo 02/21/2019   Hyperlipidemia    Hypertension    Syncope 09/2018    PCP: Natalia Leatherwood, DO   REFERRING PROVIDER: Rodolph Bong, MD  REFERRING DIAG: M54.50 (ICD-10-CM) - Lumbar pain   Rationale for Evaluation and Treatment: Rehabilitation  THERAPY DIAG:  Other low back pain  Muscle weakness (generalized)  ONSET DATE: chronic x 4 years worsening.   SUBJECTIVE:  SUBJECTIVE STATEMENT: JESSABELLE MARKIEWICZ reports doing well this morning, however overall reports no improvement in LBP, still has a lot of pain when static standing.   PERTINENT HISTORY:  HTN, osteoporosis, chronic LBP, hx C-section,   PAIN:  Are you having pain? Yes: NPRS scale: 0/10 Pain location: across low back Pain description: constant sore Aggravating factors: varies, usually standing and walking even short periods Relieving factors: sitting or laying down  PRECAUTIONS: None  WEIGHT BEARING RESTRICTIONS: No  FALLS:  Has patient fallen in last 6 months? No  LIVING ENVIRONMENT: Lives with: lives with their spouse Lives in: House/apartment Stairs: Yes: External: 1 steps; none Has following equipment at home: Grab bars  OCCUPATION: retired   PLOF: Independent  PATIENT GOALS: "bring me up as far as you can"  NEXT MD VISIT: 01/17/23  OBJECTIVE:   DIAGNOSTIC FINDINGS:  11/10/22 DG Lumbar spine  IMPRESSION: Minimal lower lumbar facet arthropathy for age. No acute osseous findings or malalignment.  PATIENT SURVEYS:  Modified Oswestry 12/50 = 24% disability    SCREENING FOR RED FLAGS: Bowel or bladder incontinence: No Spinal tumors: No Cauda equina syndrome: No Compression fracture: No Abdominal aneurysm: No  COGNITION: Overall cognitive status: Within functional  limits for tasks assessed     SENSATION: WFL  MUSCLE LENGTH: Hamstrings: moderate tightness bil  POSTURE: No Significant postural limitations  PALPATION: No tenderness with palpation in low back or glutes  LUMBAR ROM:   AROM eval  Flexion To mid shin  Extension WNL, "feels good"  Right lateral flexion To knee  Left lateral flexion To knee  Right rotation WNL  Left rotation WNL   (Blank rows = not tested)  LOWER EXTREMITY ROM:   good hip mobility bil, symmetric and pain free.    LOWER EXTREMITY MMT:    MMT Right eval Left eval  Hip flexion 4 4  Hip extension    Hip abduction 4+ 4+  Hip adduction 5 5  Knee flexion 4+ 4+  Knee extension 5 5  Ankle dorsiflexion 5 5  Ankle plantarflexion     (Blank rows = not tested)  LUMBAR SPECIAL TESTS:  Straight leg raise test: Negative, FABER test: Negative, and Long sit test: Negative Left leg 1/2 inch shorter than Right   FUNCTIONAL TESTS:  5 times sit to stand: 14 seconds without UE assist.   GAIT: Distance walked: 21' Assistive device utilized: None Level of assistance: Complete Independence Comments: no significant deviation  TODAY'S TREATMENT:                                                                                                                              DATE:  12/15/2022 Therapeutic Exercise: to improve strength and mobility.  Demo, verbal and tactile cues throughout for technique. Nustep L5 x 6 min  At counter for safety with bil UE support: Heel raises 2 x 10 bil  Hip extension 2 x 10 each Hip abduction 2 x  10 each Lumbar extension x 15 leaning against counter for support Squats with chair behind for safety 2 x 15  Standing bird dogs x 10 each side Standing hip hikes from 1" step x 15 each side   12/12/2022 Therapeutic Exercise: to improve strength and mobility.  Demo, verbal and tactile cues throughout for technique. BIKE L1x33min Supine PPT x 10 LTR x 10 each direction Supine trA bicycles x  10 - mod challenge to keep core engaged Supine march B with TrA 10x5" B hip flexion isometric with TrA 10x5"- TrA brace Standing hip abduction x 10 bil RTB Standing hip extension x 10 bil RTB Hip hike from 2' book x 10 bil  12/09/2022 Therapeutic Exercise: to improve strength and mobility.  Demo, verbal and tactile cues throughout for technique. Nustep L5 x 6 min PPT x 10  LTR x 10  Supine hip ball squeeze with PPT x 10  Bridge with ball squeeze 2x10 Prone hip extension 2x10 bil S/L hip abduction x 10 bil Supine march with TrA 10x5" bil Supine trA bicycles x 10  12/06/2022 Therapeutic Exercise: to improve strength and mobility.  Demo, verbal and tactile cues throughout for technique. Nustep L4 x 6 min  LTR x 10 PPT x 10 Bridge 2 x 10  SLR 2 x 10 each S/L modified clams 2 x 10 each side Prone leg extensions 2 x 10 each    PATIENT EDUCATION:  Education details: HEP review Person educated: Patient Education method: Explanation and Verbal cues Education comprehension: verbalized understanding and returned demonstration  HOME EXERCISE PROGRAM: Access Code: [email or web address] URL: [email or web address] Date: 12/15/2022 Prepared by: Harrie Foreman  Exercises - Supine Posterior Pelvic Tilt  - 1 x daily - 7 x weekly - 1 sets - 10 reps - 5 sec hold - Supine Bridge  - 1 x daily - 7 x weekly - 2-3 sets - 10 reps - Active Straight Leg Raise with Quad Set  - 1 x daily - 7 x weekly - 2-3 sets - 10 reps - Clamshell  - 1 x daily - 7 x weekly - 2-3 sets - 10 reps - Beginner Prone Single Leg Raise  - 1 x daily - 7 x weekly - 2-3 sets - 10 reps - Supine March  - 1 x daily - 7 x weekly - 2 sets - 10 reps - 5 sec hold - Isometric Dead Bug  - 1 x daily - 7 x weekly - 2 sets - 10 reps - 5 sec hold - Heel Raises with Counter Support  - 1 x daily - 7 x weekly - 2 sets - 10 reps - Standing Hip Abduction with Counter Support  - 1 x daily - 7 x weekly - 2 sets - 10 reps - Standing Hip  Extension with Counter Support  - 1 x daily - 7 x weekly - 2 sets - 10 reps - Mini Squat with Counter Support  - 1 x daily - 7 x weekly - 2 sets - 10 reps - Bird Dog on Counter  - 1 x daily - 7 x weekly - 2 sets - 10 reps - Standing Lumbar Extension with Counter  - 3 x daily - 7 x weekly - 1 sets - 10 reps  ASSESSMENT:  CLINICAL IMPRESSION: Teressa Mcglocklin Hoefling reports good compliance with supine exercises, however not reporting any improvement with standing tolerance, so focused today on standing exercises.  She was able to complete all exercises today with minimal rest breaks  without complaint of LBP.  Reported decreased discomfort with standing lumbar extension.  Progressed HEP for standing counter exercises, with lumbar extension to be performed throughout the day any time experiencing LBP.  [name] continues to demonstrate potential for improvement and would benefit from continued skilled therapy to address impairments.        OBJECTIVE IMPAIRMENTS: decreased activity tolerance, decreased endurance, difficulty walking, decreased strength, impaired flexibility, and pain.   ACTIVITY LIMITATIONS: carrying, lifting, standing, squatting, bathing, dressing, and locomotion level  PARTICIPATION LIMITATIONS: meal prep, cleaning, laundry, shopping, community activity, and yard work  PERSONAL FACTORS: Time since onset of injury/illness/exacerbation and 1-2 comorbidities: osteoporosis, chronic low back pain  are also affecting patient's functional outcome.   REHAB POTENTIAL: Good  CLINICAL DECISION MAKING: Stable/uncomplicated  EVALUATION COMPLEXITY: Low   GOALS: Goals reviewed with patient? Yes  SHORT TERM GOALS: Target date: 12/15/2022   Patient will be independent with initial HEP.  Baseline: needs Goal status: IN PROGRESS 11/29/22- given   LONG TERM GOALS: Target date: 01/05/2023    Patient will be independent with advanced/ongoing HEP to improve outcomes and carryover.  Baseline:   Goal status: IN PROGRESS  2.  Patient will report 75% improvement in low back pain to improve QOL.  Baseline:  Goal status: IN PROGRESS  3. Patient will demonstrate improved functional strength as demonstrated by 5/5 hip strength. Baseline: see objective Goal status: IN PROGRESS  4.  Patient will report 6 points improvement on modified Oswestry to demonstrate improved functional ability.  Baseline: 12/50 Goal status: IN PROGRESS   5.  Patient will tolerate 15 min of standing to cook. Baseline: increases LBP, has to take breaks Goal status: IN PROGRESS  7.  Patient will be able to walk for 30 min without increased pain for exercise.  Baseline: increases pain Goal status: IN PROGRESS   PLAN:  PT FREQUENCY: 1-2x/week  PT DURATION: 6 weeks  PLANNED INTERVENTIONS: Therapeutic exercises, Therapeutic activity, Neuromuscular re-education, Balance training, Gait training, Patient/Family education, Self Care, Joint mobilization, Stair training, Orthotic/Fit training, Dry Needling, Electrical stimulation, Spinal manipulation, Spinal mobilization, Cryotherapy, Moist heat, Taping, Traction, Ultrasound, Manual therapy, and Re-evaluation.  PLAN FOR NEXT SESSION: focus on core strengthening exercises, progress standing tolerance.    Jena Gauss, PT 12/15/2022, 10:44 AM

## 2022-12-20 ENCOUNTER — Ambulatory Visit: Payer: Medicare Other | Admitting: Physical Therapy

## 2022-12-20 ENCOUNTER — Encounter: Payer: Self-pay | Admitting: Physical Therapy

## 2022-12-20 DIAGNOSIS — M6281 Muscle weakness (generalized): Secondary | ICD-10-CM

## 2022-12-20 DIAGNOSIS — M5459 Other low back pain: Secondary | ICD-10-CM | POA: Diagnosis not present

## 2022-12-20 NOTE — Therapy (Signed)
OUTPATIENT PHYSICAL THERAPY TREATMENT   Patient Name: Denise Rios MRN: [id number] DOB:06-14-1950, 72 y.o., female Today's Date: 12/20/2022  END OF SESSION:  PT End of Session - 12/20/22 1104     Visit Number 7    Number of Visits 12    Date for PT Re-Evaluation 01/05/23    PT Start Time 1103    PT Stop Time 1145    PT Time Calculation (min) 42 min    Activity Tolerance Patient tolerated treatment well    Behavior During Therapy [facility] for tasks assessed/performed               Past Medical History:  Diagnosis Date   Anhedonia    Anxiety    Arteriosclerosis of carotid artery 03/19/2019   Less than 50%   Chicken pox    Hematuria 12/19/2017   History of fainting spells of unknown cause    Hyperlipidemia    Hypertension    Memory loss    prioor PCP records indicate neuro referral was made   Menopause    Osteoporosis    Rotator cuff (capsule) sprain    R>L   Syncope 09/2018   Vitamin D deficiency    Past Surgical History:  Procedure Laterality Date   CESAREAN SECTION  1980   ELECTROCARDIOGRAM  10/24/2018   SR. HR74, PR 167, QTC 406, NL-EKG   event monitor  01/10/2019   Predominant normal sinus rhythm.  HR 54-1 38.  Average HR 75.  No atrial fib.  Rare ectopic beats.  5 beat episode of nonsustained ventricular tachycardia x1.  No bradycardia arrhythmias.  No pauses.  Normal sinus rhythm without ectopy.   Image: CT chest  10/25/2018   normal- r/o PE   TRANSTHORACIC ECHOCARDIOGRAM  11/26/2018   EF 60-65%.  Normal study.  Completed presyncope.   US CAROTID DOPPLER BILATERAL ([facility] HX)  10/24/2018   Arteriolosclerosis.  Findings consistent with less than 50% stenosis.  Bilateral vertebral blood flow demonstrated.   Patient Active Problem List   Diagnosis Date Noted   FH: Alzheimer's disease (mother and 6 sibs) 11/10/2022   Sensorineural hearing loss, bilateral 04/19/2022   Lumbar pain 07/30/2021   Anxiousness 02/12/2021   Neck pain 08/12/2020   Osteoporosis  03/19/2019   Benign paroxysmal positional vertigo 02/21/2019   Hyperlipidemia    Hypertension    Syncope 09/2018    PCP: Denise Leatherwood, DO   REFERRING PROVIDER: Rodolph Bong, MD  REFERRING DIAG: M54.50 (ICD-10-CM) - Lumbar pain   Rationale for Evaluation and Treatment: Rehabilitation  THERAPY DIAG:  Other low back pain  Muscle weakness (generalized)  ONSET DATE: chronic x 4 years worsening.   SUBJECTIVE:  SUBJECTIVE STATEMENT: Denise Rios reports doing well this morning, even did her exercises this morning.  Noticing that her back isn't hurting as much as it was when watering flowers.   PERTINENT HISTORY:  HTN, osteoporosis, chronic LBP, hx C-section,   PAIN:  Are you having pain? Yes: NPRS scale: 0/10 Pain location: across low back Pain description: constant sore Aggravating factors: varies, usually standing and walking even short periods Relieving factors: sitting or laying down  PRECAUTIONS: None  WEIGHT BEARING RESTRICTIONS: No  FALLS:  Has patient fallen in last 6 months? No  LIVING ENVIRONMENT: Lives with: lives with their spouse Lives in: House/apartment Stairs: Yes: External: 1 steps; none Has following equipment at home: Grab bars  OCCUPATION: retired   PLOF: Independent  PATIENT GOALS: "bring me up as far as you can"  NEXT MD VISIT: 01/17/23  OBJECTIVE:   DIAGNOSTIC FINDINGS:  11/10/22 DG Lumbar spine  IMPRESSION: Minimal lower lumbar facet arthropathy for age. No acute osseous findings or malalignment.  PATIENT SURVEYS:  Modified Oswestry 12/50 = 24% disability    SCREENING FOR RED FLAGS: Bowel or bladder incontinence: No Spinal tumors: No Cauda equina syndrome: No Compression fracture: No Abdominal aneurysm: No  COGNITION: Overall cognitive  status: Within functional limits for tasks assessed     SENSATION: WFL  MUSCLE LENGTH: Hamstrings: moderate tightness bil  POSTURE: No Significant postural limitations  PALPATION: No tenderness with palpation in low back or glutes  LUMBAR ROM:   AROM eval  Flexion To mid shin  Extension WNL, "feels good"  Right lateral flexion To knee  Left lateral flexion To knee  Right rotation WNL  Left rotation WNL   (Blank rows = not tested)  LOWER EXTREMITY ROM:   good hip mobility bil, symmetric and pain free.    LOWER EXTREMITY MMT:    MMT Right eval Left eval  Hip flexion 4 4  Hip extension    Hip abduction 4+ 4+  Hip adduction 5 5  Knee flexion 4+ 4+  Knee extension 5 5  Ankle dorsiflexion 5 5  Ankle plantarflexion     (Blank rows = not tested)  LUMBAR SPECIAL TESTS:  Straight leg raise Rios: Negative, FABER Rios: Negative, and Long sit Rios: Negative Left leg 1/2 inch shorter than Right   FUNCTIONAL TESTS:  5 times sit to stand: 14 seconds without UE assist.   GAIT: Distance walked: 41' Assistive device utilized: None Level of assistance: Complete Independence Comments: no significant deviation  TODAY'S TREATMENT:                                                                                                                              DATE:   12/20/2022 Therapeutic Exercise: to improve strength and mobility.  Demo, verbal and tactile cues throughout for technique. Nustep L5 x 6 min  At counter for safety with finger tip support only: Heel raises x 20 Toe raises x 20 Hip extension  2 x 10 each Hip abduction 2 x 10 each Hip flexion 2 x 10 each side Lumbar extension x 15 leaning against counter for support Squats with chair behind for safety 2 x 10 Standing bird dogs 2 x 10 each side Gait in hallway x 10 min with therapist setting rapid pace throughout.   12/15/2022 Therapeutic Exercise: to improve strength and mobility.  Demo, verbal and tactile cues  throughout for technique. Nustep L5 x 6 min  At counter for safety with bil UE support: Heel raises 2 x 10 bil  Hip extension 2 x 10 each Hip abduction 2 x 10 each Lumbar extension x 15 leaning against counter for support Squats with chair behind for safety 2 x 15  Standing bird dogs x 10 each side Standing hip hikes from 1" step x 15 each side   12/12/2022 Therapeutic Exercise: to improve strength and mobility.  Demo, verbal and tactile cues throughout for technique. BIKE L1x79min Supine PPT x 10 LTR x 10 each direction Supine trA bicycles x 10 - mod challenge to keep core engaged Supine march B with TrA 10x5" B hip flexion isometric with TrA 10x5"- TrA brace Standing hip abduction x 10 bil RTB Standing hip extension x 10 bil RTB Hip hike from 2' book x 10 bil   PATIENT EDUCATION:  Education details: HEP review Person educated: Patient Education method: Explanation and Verbal cues Education comprehension: verbalized understanding and returned demonstration  HOME EXERCISE PROGRAM: Access Code: [email or web address] URL: [email or web address] Date: 12/15/2022 Prepared by: Harrie Foreman  Exercises - Supine Posterior Pelvic Tilt  - 1 x daily - 7 x weekly - 1 sets - 10 reps - 5 sec hold - Supine Bridge  - 1 x daily - 7 x weekly - 2-3 sets - 10 reps - Active Straight Leg Raise with Quad Set  - 1 x daily - 7 x weekly - 2-3 sets - 10 reps - Clamshell  - 1 x daily - 7 x weekly - 2-3 sets - 10 reps - Beginner Prone Single Leg Raise  - 1 x daily - 7 x weekly - 2-3 sets - 10 reps - Supine March  - 1 x daily - 7 x weekly - 2 sets - 10 reps - 5 sec hold - Isometric Dead Bug  - 1 x daily - 7 x weekly - 2 sets - 10 reps - 5 sec hold - Heel Raises with Counter Support  - 1 x daily - 7 x weekly - 2 sets - 10 reps - Standing Hip Abduction with Counter Support  - 1 x daily - 7 x weekly - 2 sets - 10 reps - Standing Hip Extension with Counter Support  - 1 x daily - 7 x weekly - 2 sets -  10 reps - Mini Squat with Counter Support  - 1 x daily - 7 x weekly - 2 sets - 10 reps - Bird Dog on Counter  - 1 x daily - 7 x weekly - 2 sets - 10 reps - Standing Lumbar Extension with Counter  - 3 x daily - 7 x weekly - 1 sets - 10 reps  ASSESSMENT:  CLINICAL IMPRESSION: LEHUA FLORES reports she is more compliant with her HEP and starting to see improving tolerance to standing activity.  She is also doing lumbar extension frequently when back feels tired.  Today continued focusing on standing exercises, able to participate in session with minimal rest breaks (only brief after squats) and no  complaint of back pain even after fast paced walk in hall.  [name] continues to demonstrate potential for improvement and would benefit from continued skilled therapy to address impairments.        OBJECTIVE IMPAIRMENTS: decreased activity tolerance, decreased endurance, difficulty walking, decreased strength, impaired flexibility, and pain.   ACTIVITY LIMITATIONS: carrying, lifting, standing, squatting, bathing, dressing, and locomotion level  PARTICIPATION LIMITATIONS: meal prep, cleaning, laundry, shopping, community activity, and yard work  PERSONAL FACTORS: Time since onset of injury/illness/exacerbation and 1-2 comorbidities: osteoporosis, chronic low back pain  are also affecting patient's functional outcome.   REHAB POTENTIAL: Good  CLINICAL DECISION MAKING: Stable/uncomplicated  EVALUATION COMPLEXITY: Low   GOALS: Goals reviewed with patient? Yes  SHORT TERM GOALS: Target date: 12/15/2022   Patient will be independent with initial HEP.  Baseline: needs Goal status: IN PROGRESS 11/29/22- given 12/20/22- met   LONG TERM GOALS: Target date: 01/05/2023    Patient will be independent with advanced/ongoing HEP to improve outcomes and carryover.  Baseline:  Goal status: IN PROGRESS 12/20/22- met for current  2.  Patient will report 75% improvement in low back pain to improve  QOL.  Baseline:  Goal status: IN PROGRESS  3. Patient will demonstrate improved functional strength as demonstrated by 5/5 hip strength. Baseline: see objective Goal status: IN PROGRESS  4.  Patient will report 6 points improvement on modified Oswestry to demonstrate improved functional ability.  Baseline: 12/50 Goal status: IN PROGRESS   5.  Patient will tolerate 15 min of standing to cook. Baseline: increases LBP, has to take breaks Goal status: IN PROGRESS   7.  Patient will be able to walk for 30 min without increased pain for exercise.  Baseline: increases pain Goal status: IN PROGRESS 12/20/22 - no pain after 10 min walk.    PLAN:  PT FREQUENCY: 1-2x/week  PT DURATION: 6 weeks  PLANNED INTERVENTIONS: Therapeutic exercises, Therapeutic activity, Neuromuscular re-education, Balance training, Gait training, Patient/Family education, Self Care, Joint mobilization, Stair training, Orthotic/Fit training, Dry Needling, Electrical stimulation, Spinal manipulation, Spinal mobilization, Cryotherapy, Moist heat, Taping, Traction, Ultrasound, Manual therapy, and Re-evaluation.  PLAN FOR NEXT SESSION: focus on core strengthening exercises, progress standing tolerance.    Jena Gauss, PT 12/20/2022, 11:50 AM

## 2022-12-22 ENCOUNTER — Ambulatory Visit (INDEPENDENT_AMBULATORY_CARE_PROVIDER_SITE_OTHER): Payer: Medicare Other | Admitting: Family Medicine

## 2022-12-22 ENCOUNTER — Encounter: Payer: Self-pay | Admitting: Family Medicine

## 2022-12-22 ENCOUNTER — Ambulatory Visit: Payer: Medicare Other | Admitting: Family Medicine

## 2022-12-22 ENCOUNTER — Ambulatory Visit: Payer: Medicare Other

## 2022-12-22 VITALS — BP 142/80 | HR 65 | Temp 98.3°F | Wt 138.2 lb

## 2022-12-22 DIAGNOSIS — E782 Mixed hyperlipidemia: Secondary | ICD-10-CM | POA: Diagnosis not present

## 2022-12-22 DIAGNOSIS — M5459 Other low back pain: Secondary | ICD-10-CM | POA: Diagnosis not present

## 2022-12-22 DIAGNOSIS — F419 Anxiety disorder, unspecified: Secondary | ICD-10-CM

## 2022-12-22 DIAGNOSIS — I10 Essential (primary) hypertension: Secondary | ICD-10-CM | POA: Diagnosis not present

## 2022-12-22 DIAGNOSIS — M6281 Muscle weakness (generalized): Secondary | ICD-10-CM

## 2022-12-22 DIAGNOSIS — M545 Low back pain, unspecified: Secondary | ICD-10-CM

## 2022-12-22 DIAGNOSIS — M542 Cervicalgia: Secondary | ICD-10-CM

## 2022-12-22 DIAGNOSIS — M810 Age-related osteoporosis without current pathological fracture: Secondary | ICD-10-CM

## 2022-12-22 DIAGNOSIS — E559 Vitamin D deficiency, unspecified: Secondary | ICD-10-CM | POA: Diagnosis not present

## 2022-12-22 DIAGNOSIS — Z1231 Encounter for screening mammogram for malignant neoplasm of breast: Secondary | ICD-10-CM

## 2022-12-22 LAB — COMPREHENSIVE METABOLIC PANEL
ALT: 20 U/L (ref 0–35)
AST: 22 U/L (ref 0–37)
Albumin: 4.2 g/dL (ref 3.5–5.2)
Alkaline Phosphatase: 32 U/L — ABNORMAL LOW (ref 39–117)
BUN: 15 mg/dL (ref 6–23)
CO2: 30 mEq/L (ref 19–32)
Calcium: 9.7 mg/dL (ref 8.4–10.5)
Chloride: 102 mEq/L (ref 96–112)
Creatinine, Ser: 0.97 mg/dL (ref 0.40–1.20)
GFR: 59.31 mL/min — ABNORMAL LOW (ref >60.00)
Glucose, Bld: 96 mg/dL (ref 70–99)
Potassium: 3.9 mEq/L (ref 3.5–5.1)
Sodium: 139 mEq/L (ref 135–145)
Total Bilirubin: 0.4 mg/dL (ref 0.2–1.2)
Total Protein: 6.4 g/dL (ref 6.0–8.3)

## 2022-12-22 LAB — CBC
HCT: 38.6 % (ref 36.0–46.0)
Hemoglobin: 12.8 g/dL (ref 12.0–15.0)
MCHC: 33.1 g/dL (ref 30.0–36.0)
MCV: 88.2 fl (ref 78.0–100.0)
Platelets: 255 10*3/uL (ref 150.0–400.0)
RBC: 4.37 Mil/uL (ref 3.87–5.11)
RDW: 14 % (ref 11.5–15.5)
WBC: 4.5 10*3/uL (ref 4.0–10.5)

## 2022-12-22 LAB — LIPID PANEL
Cholesterol: 183 mg/dL (ref 0–200)
HDL: 74.1 mg/dL (ref >39.00)
LDL Cholesterol: 98 mg/dL (ref 0–99)
NonHDL: 109.24
Total CHOL/HDL Ratio: 2
Triglycerides: 54 mg/dL (ref 0.0–149.0)
VLDL: 10.8 mg/dL (ref 0.0–40.0)

## 2022-12-22 LAB — VITAMIN D 25 HYDROXY (VIT D DEFICIENCY, FRACTURES): VITD: 31.55 ng/mL (ref 30.00–100.00)

## 2022-12-22 LAB — TSH: TSH: 1.18 u[IU]/mL (ref 0.35–5.50)

## 2022-12-22 MED ORDER — TRIAMTERENE-HCTZ 37.5-25 MG PO TABS: 1.0000 | ORAL_TABLET | Freq: Every day | ORAL | 1 refills | Status: DC

## 2022-12-22 MED ORDER — DICLOFENAC SODIUM 75 MG PO TBEC: 75.0000 mg | DELAYED_RELEASE_TABLET | Freq: Two times a day (BID) | ORAL | 1 refills | Status: DC

## 2022-12-22 MED ORDER — ROSUVASTATIN CALCIUM 20 MG PO TABS: ORAL_TABLET | ORAL | 3 refills | Status: DC

## 2022-12-22 MED ORDER — AMLODIPINE BESYLATE 5 MG PO TABS: 5.0000 mg | ORAL_TABLET | Freq: Every day | ORAL | 1 refills | Status: DC

## 2022-12-22 MED ORDER — BUPROPION HCL ER (XL) 300 MG PO TB24: ORAL_TABLET | ORAL | 1 refills | Status: DC

## 2022-12-22 MED ORDER — ALENDRONATE SODIUM 70 MG PO TABS: 70.0000 mg | ORAL_TABLET | ORAL | 3 refills | Status: DC

## 2022-12-22 NOTE — Patient Instructions (Signed)
Return in about 24 weeks (around 06/08/2023) for Routine chronic condition follow-up.        Great to see you today.  I have refilled the medication(s) we provide.   If labs were collected, we will inform you of lab results once received either by echart message or telephone call.   - echart message- for normal results that have been seen by the patient already.   - telephone call: abnormal results or if patient has not viewed results in their echart.

## 2022-12-22 NOTE — Progress Notes (Signed)
Patient ID: Denise Rios, female  DOB: 01-25-1951, 72 y.o.   MRN: [id number] Patient Care Team    Relationship Specialty Notifications Start End  Natalia Leatherwood, DO PCP - General Family Medicine  02/20/19     Chief Complaint  Patient presents with   Hypertension   Subjective: Denise Rios is a 72 y.o.  female present for [facility] Lumbar back pain/arthralgia Patient reports compliance with diclofenac twice daily.  She does not feel it takes away the pain on many days.  Patient reports her lower back is starting to hurt more frequently, and taking longer to respond to rest and medication. She underwent arthritic workup.  Currently seeing sports med and physical therapy.  Anxiety: Patient reports she started to have anxiety 2/2 to dwelling on dementia about 2020.  She has a strong family history of dementia on her mother side.  6 of 8 children on her mother side have been diagnosed with Alzheimer's.  Her mother died in her 22s with Alzheimer's.  She presented to her PCP with these concerns and she was started on Wellbutrin to help her with her anxiety and focus.   She feels the Wellbutrin 300 mg daily is working well for her   Hypertension/HLD/overweight: Pt reports compliance with amlodipine 2.5 mg daily, Crestor 20 mg daily and with Maxide. Patient denies chest pain, shortness of breath, dizziness or lower extremity edema.   Syncope/vertigo:  No current episodes.  Prior note: Patient reports in May 2021 she had an event where she had a syncopal episode.  She reports she had no symptoms prior to passing out other than becoming very dizzy and then blacked out.  She states it was a an extremely hot day and they were looking for a new home in the area.  She felt extremely hot and tried to walk outside to get some air, and then was found passed out.  She states she was only passed out for less than 2 minutes.  She does think she hit her head on the sidewalk on the way down.  She was taken  to the emergency room with a negative work-up.  She states she did hit her head during that time.  Since then she has had some room spinning vertigo when looking towards the left and sitting forward.  She had cardiac echo and event monitoring for work-up for her her syncope without positive findings.   Event monitoring 01/10/2019 -Predominant normal sinus rhythm.  The heart rate ranged from 54 to 138 bpm and the average heart rate was 75 bpm. There was no atrial fibrillation. There was rare ectopic beats. There was one 5 beat episode of nonsustained ventricular tachycardia. No bradycardia arrhythmias and no pauses. Symptoms correlate with normal sinus rhythm without ectopy.  -Transthoracic echo 11/26/2018: Left ventricle: Cavity size is normal.  Wall thickness is normal.  Systolic function is normal with an estimated EF of 60-65%. Left atrium: Volume index is normal. Right atrium: Normal in size Right ventricle: Cavity size appears normal.  Systolic function is normal.      12/22/2022   10:52 AM 11/10/2022    9:36 AM 09/21/2022   11:38 AM 01/14/2022    9:45 AM 09/08/2021    1:09 PM  Depression screen PHQ 2/9  Decreased Interest 0 0 0 0 0  Down, Depressed, Hopeless 0 0 0 0 0  PHQ - 2 Score 0 0 0 0 0  Altered sleeping    2  Tired, decreased energy    0   Change in appetite    0   Feeling bad or failure about yourself     1   Trouble concentrating    0   Moving slowly or fidgety/restless    0   Suicidal thoughts    0   PHQ-9 Score    3       01/14/2022    9:45 AM 07/30/2021    1:10 PM 08/23/2019    2:41 PM 02/20/2019    1:13 PM  GAD 7 : Generalized Anxiety Score  Nervous, Anxious, on Edge 0 0 0 1  Control/stop worrying 0 0 0 1  Worry too much - different things 0 0 0 2  Trouble relaxing 1 0 0 0  Restless 0 0 0 0  Easily annoyed or irritable 0 0 0 0  Afraid - awful might happen 1 0 0 1  Total GAD 7 Score 2 0 0 5  Anxiety Difficulty   Not difficult at all Not difficult at all            12/22/2022   10:52 AM 11/10/2022    9:36 AM 09/21/2022   11:40 AM 01/13/2022    9:03 PM 09/08/2021    1:11 PM  Fall Risk   Falls in the past year? 1 1 0 0 0  Number falls in past yr: 0 0 0  0  Injury with Fall? 0 0 0  0  Risk for fall due to :  No Fall Risks Impaired vision  Impaired vision  Follow up Falls evaluation completed Falls evaluation completed Falls prevention discussed  Falls prevention discussed     Immunization History  Administered Date(s) Administered   PFIZER(Purple Top)SARS-COV-2 Vaccination 06/21/2019, 07/11/2019, 04/15/2020   Zoster Recombinant(Shingrix) 03/26/2013, 12/28/2021    No results found.  Past Medical History:  Diagnosis Date   Anhedonia    Anxiety    Arteriosclerosis of carotid artery 03/19/2019   Less than 50%   Chicken pox    Hematuria 12/19/2017   History of fainting spells of unknown cause    Hyperlipidemia    Hypertension    Memory loss    prioor PCP records indicate neuro referral was made   Menopause    Osteoporosis    Rotator cuff (capsule) sprain    R>L   Syncope 09/2018   Vitamin D deficiency    Allergies  Allergen Reactions   Lisinopril Cough   Past Surgical History:  Procedure Laterality Date   CESAREAN SECTION  1980   ELECTROCARDIOGRAM  10/24/2018   SR. HR74, PR 167, QTC 406, NL-EKG   event monitor  01/10/2019   Predominant normal sinus rhythm.  HR 54-1 38.  Average HR 75.  No atrial fib.  Rare ectopic beats.  5 beat episode of nonsustained ventricular tachycardia x1.  No bradycardia arrhythmias.  No pauses.  Normal sinus rhythm without ectopy.   Image: CT chest  10/25/2018   normal- r/o PE   TRANSTHORACIC ECHOCARDIOGRAM  11/26/2018   EF 60-65%.  Normal study.  Completed presyncope.   US CAROTID DOPPLER BILATERAL ([facility] HX)  10/24/2018   Arteriolosclerosis.  Findings consistent with less than 50% stenosis.  Bilateral vertebral blood flow demonstrated.   Family History  Problem Relation Age of Onset    Alzheimer's disease Mother        6: 8 children on her mother's side have Alzheimer's.   Bone cancer Father    Early death  Brother    Early death Maternal Grandfather    Heart attack Maternal Grandfather    Social History   Social History Narrative   Marital status/children/pets: married, 2 children.    Education/employment: retired   Field seismologist:      -smoke alarm in the home:Yes     - wears seatbelt: Yes     - Feels safe in their relationships: Yes    Allergies as of 12/22/2022       Reactions   Lisinopril Cough        Medication List        Accurate as of December 22, 2022 12:06 PM. If you have any questions, ask your nurse or doctor.          alendronate 70 MG tablet Commonly known as: FOSAMAX Take 1 tablet (70 mg total) by mouth every 7 (seven) days. Take with a full glass of water on an empty stomach. Must remain upright for 30 minutes   amLODipine 5 MG tablet Commonly known as: NORVASC Take 1 tablet (5 mg total) by mouth daily. What changed:  medication strength how much to take how to take this when to take this additional instructions Changed by: Felix Pacini   buPROPion 300 MG 24 hr tablet Commonly known as: WELLBUTRIN XL TAKE 1 TABLET(300 MG) BY MOUTH DAILY   diclofenac 75 MG EC tablet Commonly known as: VOLTAREN Take 1 tablet (75 mg total) by mouth 2 (two) times daily.   fluticasone 50 MCG/ACT nasal spray Commonly known as: FLONASE Place 2 sprays into both nostrils daily.   rosuvastatin 20 MG tablet Commonly known as: CRESTOR TAKE 1 TABLET(10 MG) BY MOUTH AT BEDTIME   triamterene-hydrochlorothiazide 37.5-25 MG tablet Commonly known as: MAXZIDE-25 Take 1 tablet by mouth daily.        All past medical history, surgical history, allergies, family history, immunizations andmedications were updated in the EMR today and reviewed under the history and medication portions of their EMR.    Recent Results (from the past 2160 hour(s))  ANA, IFA  Comprehensive Panel-([facility])     Status: Abnormal   Collection Time: 11/10/22  9:39 AM  Result Value Ref Range   Anti Nuclear Antibody (ANA) POSITIVE (A) NEGATIVE    Comment: ANA IFA is a first line screen for detecting the presence of up to approximately 150 autoantibodies in various autoimmune diseases. A positive ANA IFA result is suggestive of autoimmune disease and reflexes to titer and pattern. Further laboratory testing may be considered if clinically indicated. . For additional information, please refer to http://education.[facility].com/faq/FAQ177 (This link is being provided for informational/ educational purposes only.) .    ds DNA Ab <1 IU/mL    Comment:                            IU/mL       Interpretation                            < or = 4    Negative                            5-9         Indeterminate                            > or =  10   Positive .    Scleroderma (Scl-70) (ENA) Antibody, IgG <1.0 NEG <1.0 NEG AI   ENA SM Ab Ser-aCnc <1.0 NEG <1.0 NEG AI   SM/RNP <1.0 NEG <1.0 NEG AI   SSA (Ro) (ENA) Antibody, IgG <1.0 NEG <1.0 NEG AI   SSB (La) (ENA) Antibody, IgG <1.0 NEG <1.0 NEG AI  Cyclic citrul peptide antibody, IgG ([facility])     Status: None   Collection Time: 11/10/22  9:39 AM  Result Value Ref Range   Cyclic Citrullin Peptide Ab <82 UNITS    Comment: Reference Range Negative:            <20 Weak Positive:       20-39 Moderate Positive:   40-59 Strong Positive:     >59 .   Rheumatoid Factor     Status: None   Collection Time: 11/10/22  9:39 AM  Result Value Ref Range   Rheumatoid fact SerPl-aCnc <10 <14 IU/mL  Sedimentation rate     Status: None   Collection Time: 11/10/22  9:39 AM  Result Value Ref Range   Sed Rate 14 0 - 30 mm/hr  Anti-nuclear ab-titer (ANA titer)     Status: Abnormal   Collection Time: 11/10/22  9:39 AM  Result Value Ref Range   ANA Titer 1 1:160 (H) titer    Comment:                 Reference Range                  <1:40        Negative                 1:40-1:80    Low Antibody Level                 >1:80        Elevated Antibody Level .    ANA Pattern 1 Nuclear, Homogeneous (A)     Comment: Homogeneous pattern is associated with systemic lupus erythematosus (SLE), drug-induced lupus and juvenile idiopathic arthritis. . AC-1: Homogeneous . International Consensus on ANA Patterns ([email or web address])     Patient was never admitted.   ROS: 14 pt review of systems performed and negative (unless mentioned in an HPI)  Objective: BP (!) 142/80   Pulse 65   Temp 98.3 F (36.8 C)   Wt 138 lb 3.2 oz (62.7 kg)   SpO2 98%   BMI 25.28 kg/m  Physical Exam Vitals and nursing note reviewed.  Constitutional:      General: She is not in acute distress.    Appearance: Normal appearance. She is not ill-appearing, toxic-appearing or diaphoretic.  HENT:     Head: Normocephalic and atraumatic.  Eyes:     General: No scleral icterus.       Right eye: No discharge.        Left eye: No discharge.     Extraocular Movements: Extraocular movements intact.     Conjunctiva/sclera: Conjunctivae normal.     Pupils: Pupils are equal, round, and reactive to light.  Cardiovascular:     Rate and Rhythm: Normal rate and regular rhythm.  Pulmonary:     Effort: Pulmonary effort is normal. No respiratory distress.     Breath sounds: Normal breath sounds. No wheezing, rhonchi or rales.  Musculoskeletal:     Right lower leg: No edema.     Left lower leg: No edema.  Skin:    General: Skin  is warm.     Findings: No rash.  Neurological:     Mental Status: She is alert and oriented to person, place, and time. Mental status is at baseline.     Motor: No weakness.     Gait: Gait normal.  Psychiatric:        Mood and Affect: Mood normal.        Behavior: Behavior normal.        Thought Content: Thought content normal.        Judgment: Judgment normal.     Assessment/plan: Denise Rios  is a 72 y.o. female present for [facility] Essential hypertension/hyperlipidemia/overweight Above goal on recheck x 3 Continue Maxide 1 tab daily Increase amlodipine 5 mg qd Continue Crestor: Goal : < 135/85.  If above goal she will make an appointment to follow-up. -Low-sodium diet and exercise encouraged. Cbc, cmp,tsh, lipids collected today  Anxiousness: Stable Continue Wellbutrin 300 mg QD  Lumbar pain/neck pain Stable Continue diclofenac (VOLTAREN) 75 MG EC tablet; Take 1 tablet (75 mg total) by mouth 2 (two) times daily.  Dispense: 60 tablet; Refill: 5 Working with SM and PT.   Osteoporosis, unspecified osteoporosis type, unspecified pathological fracture presence/Vitamin D deficiency Dexa 03/20/2021 (-3.5) MCHP> ordered - Vitamin D (25 hydroxy)  Return in about 24 weeks (around 06/08/2023) for Routine chronic condition follow-up.   Meds ordered this encounter  Medications   amLODipine (NORVASC) 5 MG tablet    Sig: Take 1 tablet (5 mg total) by mouth daily.    Dispense:  90 tablet    Refill:  1   buPROPion (WELLBUTRIN XL) 300 MG 24 hr tablet    Sig: TAKE 1 TABLET(300 MG) BY MOUTH DAILY    Dispense:  90 tablet    Refill:  1   diclofenac (VOLTAREN) 75 MG EC tablet    Sig: Take 1 tablet (75 mg total) by mouth 2 (two) times daily.    Dispense:  180 tablet    Refill:  1   rosuvastatin (CRESTOR) 20 MG tablet    Sig: TAKE 1 TABLET(10 MG) BY MOUTH AT BEDTIME    Dispense:  90 tablet    Refill:  3   triamterene-hydrochlorothiazide (MAXZIDE-25) 37.5-25 MG tablet    Sig: Take 1 tablet by mouth daily.    Dispense:  90 tablet    Refill:  1   alendronate (FOSAMAX) 70 MG tablet    Sig: Take 1 tablet (70 mg total) by mouth every 7 (seven) days. Take with a full glass of water on an empty stomach. Must remain upright for 30 minutes    Dispense:  12 tablet    Refill:  3   Orders Placed This Encounter  Procedures   MM 3D SCREENING MAMMOGRAM BILATERAL BREAST   DG Bone Density   CBC    Comp Met (CMET)   TSH   Lipid panel   Vitamin D (25 hydroxy)    Note is dictated utilizing voice recognition software. Although note has been proof read prior to signing, occasional typographical errors still can be missed. If any questions arise, please do not hesitate to call for verification.  Electronically signed by: Felix Pacini, DO [facility] [place]

## 2022-12-22 NOTE — Therapy (Signed)
OUTPATIENT PHYSICAL THERAPY TREATMENT   Patient Name: Denise Rios MRN: [id number] DOB:1951/02/21, 72 y.o., female Today's Date: 12/22/2022  END OF SESSION:  PT End of Session - 12/22/22 1318     Visit Number 8    Number of Visits 12    Date for PT Re-Evaluation 01/05/23    PT Start Time 1316    PT Stop Time 1400    PT Time Calculation (min) 44 min    Activity Tolerance Patient tolerated treatment well    Behavior During Therapy [facility] for tasks assessed/performed                Past Medical History:  Diagnosis Date   Anhedonia    Anxiety    Arteriosclerosis of carotid artery 03/19/2019   Less than 50%   Chicken pox    Hematuria 12/19/2017   History of fainting spells of unknown cause    Hyperlipidemia    Hypertension    Memory loss    prioor PCP records indicate neuro referral was made   Menopause    Osteoporosis    Rotator cuff (capsule) sprain    R>L   Syncope 09/2018   Vitamin D deficiency    Past Surgical History:  Procedure Laterality Date   CESAREAN SECTION  1980   ELECTROCARDIOGRAM  10/24/2018   SR. HR74, PR 167, QTC 406, NL-EKG   event monitor  01/10/2019   Predominant normal sinus rhythm.  HR 54-1 38.  Average HR 75.  No atrial fib.  Rare ectopic beats.  5 beat episode of nonsustained ventricular tachycardia x1.  No bradycardia arrhythmias.  No pauses.  Normal sinus rhythm without ectopy.   Image: CT chest  10/25/2018   normal- r/o PE   TRANSTHORACIC ECHOCARDIOGRAM  11/26/2018   EF 60-65%.  Normal study.  Completed presyncope.   US CAROTID DOPPLER BILATERAL ([facility] HX)  10/24/2018   Arteriolosclerosis.  Findings consistent with less than 50% stenosis.  Bilateral vertebral blood flow demonstrated.   Patient Active Problem List   Diagnosis Date Noted   FH: Alzheimer's disease (mother and 6 sibs) 11/10/2022   Sensorineural hearing loss, bilateral 04/19/2022   Lumbar pain 07/30/2021   Anxiousness 02/12/2021   Neck pain 08/12/2020   Osteoporosis  03/19/2019   Benign paroxysmal positional vertigo 02/21/2019   Hyperlipidemia    Hypertension     PCP: Natalia Leatherwood, DO   REFERRING PROVIDER: Rodolph Bong, MD  REFERRING DIAG: M54.50 (ICD-10-CM) - Lumbar pain   Rationale for Evaluation and Treatment: Rehabilitation  THERAPY DIAG:  Other low back pain  Muscle weakness (generalized)  ONSET DATE: chronic x 4 years worsening.   SUBJECTIVE:  SUBJECTIVE STATEMENT: Being more consistent with HEP. Haven't done much today so her back isn't hurting her at the moment.  PERTINENT HISTORY:  HTN, osteoporosis, chronic LBP, hx C-section,   PAIN:  Are you having pain? Yes: NPRS scale: 0/10 Pain location: across low back Pain description: constant sore Aggravating factors: varies, usually standing and walking even short periods Relieving factors: sitting or laying down  PRECAUTIONS: None  WEIGHT BEARING RESTRICTIONS: No  FALLS:  Has patient fallen in last 6 months? No  LIVING ENVIRONMENT: Lives with: lives with their spouse Lives in: House/apartment Stairs: Yes: External: 1 steps; none Has following equipment at home: Grab bars  OCCUPATION: retired   PLOF: Independent  PATIENT GOALS: "bring me up as far as you can"  NEXT MD VISIT: 01/17/23  OBJECTIVE:   DIAGNOSTIC FINDINGS:  11/10/22 DG Lumbar spine  IMPRESSION: Minimal lower lumbar facet arthropathy for age. No acute osseous findings or malalignment.  PATIENT SURVEYS:  Modified Oswestry 12/50 = 24% disability    SCREENING FOR RED FLAGS: Bowel or bladder incontinence: No Spinal tumors: No Cauda equina syndrome: No Compression fracture: No Abdominal aneurysm: No  COGNITION: Overall cognitive status: Within functional limits for tasks assessed     SENSATION: WFL  MUSCLE  LENGTH: Hamstrings: moderate tightness bil  POSTURE: No Significant postural limitations  PALPATION: No tenderness with palpation in low back or glutes  LUMBAR ROM:   AROM eval  Flexion To mid shin  Extension WNL, "feels good"  Right lateral flexion To knee  Left lateral flexion To knee  Right rotation WNL  Left rotation WNL   (Blank rows = not tested)  LOWER EXTREMITY ROM:   good hip mobility bil, symmetric and pain free.    LOWER EXTREMITY MMT:    MMT Right eval Left eval R 12/22/22 L 12/22/22  Hip flexion 4 4 4  4+  Hip extension      Hip abduction 4+ 4+ 4+ 5  Hip adduction 5 5    Knee flexion 4+ 4+    Knee extension 5 5 5 5   Ankle dorsiflexion 5 5    Ankle plantarflexion       (Blank rows = not tested)  LUMBAR SPECIAL TESTS:  Straight leg raise test: Negative, FABER test: Negative, and Long sit test: Negative Left leg 1/2 inch shorter than Right   FUNCTIONAL TESTS:  5 times sit to stand: 14 seconds without UE assist.   GAIT: Distance walked: 71' Assistive device utilized: None Level of assistance: Complete Independence Comments: no significant deviation  TODAY'S TREATMENT:                                                                                                                              DATE:  12/22/22 Therapeutic Exercise: to improve strength and mobility.  Demo, verbal and tactile cues throughout for technique. Nustep L5 x 6 min  Strength test for bil hips Squats with UE support x 10  Heel raise from 2' book x 20  Standing hip abduction 2# 2 x 10 bil Standing hip extension 2# 2 x 10 bil Standing hip flexion 2# 2 x 10 bil Multifidus walkout x 10 RTB doubled Pallof press RTB doubled x 10   12/20/2022 Therapeutic Exercise: to improve strength and mobility.  Demo, verbal and tactile cues throughout for technique. Nustep L5 x 6 min  At counter for safety with finger tip support only: Heel raises x 20 Toe raises x 20 Hip extension 2 x 10  each Hip abduction 2 x 10 each Hip flexion 2 x 10 each side Lumbar extension x 15 leaning against counter for support Squats with chair behind for safety 2 x 10 Standing bird dogs 2 x 10 each side Gait in hallway x 10 min with therapist setting rapid pace throughout.   12/15/2022 Therapeutic Exercise: to improve strength and mobility.  Demo, verbal and tactile cues throughout for technique. Nustep L5 x 6 min  At counter for safety with bil UE support: Heel raises 2 x 10 bil  Hip extension 2 x 10 each Hip abduction 2 x 10 each Lumbar extension x 15 leaning against counter for support Squats with chair behind for safety 2 x 15  Standing bird dogs x 10 each side Standing hip hikes from 1" step x 15 each side   12/12/2022 Therapeutic Exercise: to improve strength and mobility.  Demo, verbal and tactile cues throughout for technique. BIKE L1x29min Supine PPT x 10 LTR x 10 each direction Supine trA bicycles x 10 - mod challenge to keep core engaged Supine march B with TrA 10x5" B hip flexion isometric with TrA 10x5"- TrA brace Standing hip abduction x 10 bil RTB Standing hip extension x 10 bil RTB Hip hike from 2' book x 10 bil   PATIENT EDUCATION:  Education details: HEP review Person educated: Patient Education method: Explanation and Verbal cues Education comprehension: verbalized understanding and returned demonstration  HOME EXERCISE PROGRAM: Access Code: [email or web address] URL: [email or web address] Date: 12/15/2022 Prepared by: Harrie Foreman  Exercises - Supine Posterior Pelvic Tilt  - 1 x daily - 7 x weekly - 1 sets - 10 reps - 5 sec hold - Supine Bridge  - 1 x daily - 7 x weekly - 2-3 sets - 10 reps - Active Straight Leg Raise with Quad Set  - 1 x daily - 7 x weekly - 2-3 sets - 10 reps - Clamshell  - 1 x daily - 7 x weekly - 2-3 sets - 10 reps - Beginner Prone Single Leg Raise  - 1 x daily - 7 x weekly - 2-3 sets - 10 reps - Supine March  - 1 x daily - 7 x  weekly - 2 sets - 10 reps - 5 sec hold - Isometric Dead Bug  - 1 x daily - 7 x weekly - 2 sets - 10 reps - 5 sec hold - Heel Raises with Counter Support  - 1 x daily - 7 x weekly - 2 sets - 10 reps - Standing Hip Abduction with Counter Support  - 1 x daily - 7 x weekly - 2 sets - 10 reps - Standing Hip Extension with Counter Support  - 1 x daily - 7 x weekly - 2 sets - 10 reps - Mini Squat with Counter Support  - 1 x daily - 7 x weekly - 2 sets - 10 reps - Bird Dog on Counter  - 1 x daily - 7  x weekly - 2 sets - 10 reps - Standing Lumbar Extension with Counter  - 3 x daily - 7 x weekly - 1 sets - 10 reps  ASSESSMENT:  CLINICAL IMPRESSION: ANNAMAY LAYMON reports she is more compliant with her HEP and starting to see improving tolerance to standing activity.  She is also doing lumbar extension frequently when back feels tired.  Today continued focusing on standing exercises, able to participate in session with minimal rest breaks (only brief after squats) and no complaint of back pain even after fast paced walk in hall.  [name] continues to demonstrate potential for improvement and would benefit from continued skilled therapy to address impairments.        OBJECTIVE IMPAIRMENTS: decreased activity tolerance, decreased endurance, difficulty walking, decreased strength, impaired flexibility, and pain.   ACTIVITY LIMITATIONS: carrying, lifting, standing, squatting, bathing, dressing, and locomotion level  PARTICIPATION LIMITATIONS: meal prep, cleaning, laundry, shopping, community activity, and yard work  PERSONAL FACTORS: Time since onset of injury/illness/exacerbation and 1-2 comorbidities: osteoporosis, chronic low back pain  are also affecting patient's functional outcome.   REHAB POTENTIAL: Good  CLINICAL DECISION MAKING: Stable/uncomplicated  EVALUATION COMPLEXITY: Low   GOALS: Goals reviewed with patient? Yes  SHORT TERM GOALS: Target date: 12/15/2022   Patient will be  independent with initial HEP.  Baseline: needs Goal status: IN PROGRESS 11/29/22- given 12/20/22- met   LONG TERM GOALS: Target date: 01/05/2023    Patient will be independent with advanced/ongoing HEP to improve outcomes and carryover.  Baseline:  Goal status: IN PROGRESS 12/20/22- met for current  2.  Patient will report 75% improvement in low back pain to improve QOL.  Baseline:  Goal status: IN PROGRESS  3. Patient will demonstrate improved functional strength as demonstrated by 5/5 hip strength. Baseline: see objective Goal status: IN PROGRESS- 12/22/22   4.  Patient will report 6 points improvement on modified Oswestry to demonstrate improved functional ability.  Baseline: 12/50 Goal status: IN PROGRESS   5.  Patient will tolerate 15 min of standing to cook. Baseline: increases LBP, has to take breaks Goal status: IN PROGRESS   7.  Patient will be able to walk for 30 min without increased pain for exercise.  Baseline: increases pain Goal status: IN PROGRESS 12/20/22 - no pain after 10 min walk.    PLAN:  PT FREQUENCY: 1-2x/week  PT DURATION: 6 weeks  PLANNED INTERVENTIONS: Therapeutic exercises, Therapeutic activity, Neuromuscular re-education, Balance training, Gait training, Patient/Family education, Self Care, Joint mobilization, Stair training, Orthotic/Fit training, Dry Needling, Electrical stimulation, Spinal manipulation, Spinal mobilization, Cryotherapy, Moist heat, Taping, Traction, Ultrasound, Manual therapy, and Re-evaluation.  PLAN FOR NEXT SESSION: focus on core strengthening exercises, progress standing tolerance.    Darleene Cleaver, PTA 12/22/2022, 2:33 PM

## 2022-12-27 ENCOUNTER — Encounter: Payer: Self-pay | Admitting: Physical Therapy

## 2022-12-27 ENCOUNTER — Ambulatory Visit: Payer: Medicare Other | Admitting: Physical Therapy

## 2022-12-27 DIAGNOSIS — M5459 Other low back pain: Secondary | ICD-10-CM

## 2022-12-27 DIAGNOSIS — M6281 Muscle weakness (generalized): Secondary | ICD-10-CM

## 2022-12-27 NOTE — Therapy (Signed)
OUTPATIENT PHYSICAL THERAPY TREATMENT   Patient Name: Denise Rios MRN: [id number] DOB:[birth year], 72 y.o., female Today's Date: 12/27/2022  END OF SESSION:  PT End of Session - 12/27/22 1314     Visit Number 9    Number of Visits 12    Date for PT Re-Evaluation 01/05/23    PT Start Time 1315    PT Stop Time 1357    PT Time Calculation (min) 42 min    Activity Tolerance Patient tolerated treatment well    Behavior During Therapy [facility] for tasks assessed/performed                Past Medical History:  Diagnosis Date   Anhedonia    Anxiety    Arteriosclerosis of carotid artery 03/19/2019   Less than 50%   Chicken pox    Hematuria 12/19/2017   History of fainting spells of unknown cause    Hyperlipidemia    Hypertension    Memory loss    prioor PCP records indicate neuro referral was made   Menopause    Osteoporosis    Rotator cuff (capsule) sprain    R>L   Syncope 09/2018   Vitamin D deficiency    Past Surgical History:  Procedure Laterality Date   CESAREAN SECTION  1980   ELECTROCARDIOGRAM  10/24/2018   SR. HR74, PR 167, QTC 406, NL-EKG   event monitor  01/10/2019   Predominant normal sinus rhythm.  HR 54-1 38.  Average HR 75.  No atrial fib.  Rare ectopic beats.  5 beat episode of nonsustained ventricular tachycardia x1.  No bradycardia arrhythmias.  No pauses.  Normal sinus rhythm without ectopy.   Image: CT chest  10/25/2018   normal- r/o PE   TRANSTHORACIC ECHOCARDIOGRAM  11/26/2018   EF 60-65%.  Normal study.  Completed presyncope.   US CAROTID DOPPLER BILATERAL ([facility] HX)  10/24/2018   Arteriolosclerosis.  Findings consistent with less than 50% stenosis.  Bilateral vertebral blood flow demonstrated.   Patient Active Problem List   Diagnosis Date Noted   FH: Alzheimer's disease (mother and 6 sibs) 11/10/2022   Sensorineural hearing loss, bilateral 04/19/2022   Lumbar pain 07/30/2021   Anxiousness 02/12/2021   Neck pain 08/12/2020   Osteoporosis  03/19/2019   Benign paroxysmal positional vertigo 02/21/2019   Hyperlipidemia    Hypertension     PCP: Natalia Leatherwood, DO   REFERRING PROVIDER: Rodolph Bong, MD  REFERRING DIAG: M54.50 (ICD-10-CM) - Lumbar pain   Rationale for Evaluation and Treatment: Rehabilitation  THERAPY DIAG:  Other low back pain  Muscle weakness (generalized)  ONSET DATE: chronic x 4 years worsening.   SUBJECTIVE:  SUBJECTIVE STATEMENT:  My back isn't bothering as much.  I've been doing a lot but not a lot standing still, like washing dishes.    PERTINENT HISTORY:  HTN, osteoporosis, chronic LBP, hx C-section,   PAIN:  Are you having pain? Yes: NPRS scale: 0/10 Pain location: across low back Pain description: constant sore Aggravating factors: varies, usually standing and walking even short periods Relieving factors: sitting or laying down  PRECAUTIONS: None  WEIGHT BEARING RESTRICTIONS: No  FALLS:  Has patient fallen in last 6 months? No  LIVING ENVIRONMENT: Lives with: lives with their spouse Lives in: House/apartment Stairs: Yes: External: 1 steps; none Has following equipment at home: Grab bars  OCCUPATION: retired   PLOF: Independent  PATIENT GOALS: "bring me up as far as you can"  NEXT MD VISIT: 01/17/23  OBJECTIVE:   DIAGNOSTIC FINDINGS:  11/10/22 DG Lumbar spine  IMPRESSION: Minimal lower lumbar facet arthropathy for age. No acute osseous findings or malalignment.  PATIENT SURVEYS:  Modified Oswestry 12/50 = 24% disability    SCREENING FOR RED FLAGS: Bowel or bladder incontinence: No Spinal tumors: No Cauda equina syndrome: No Compression fracture: No Abdominal aneurysm: No  COGNITION: Overall cognitive status: Within functional limits for tasks  assessed     SENSATION: WFL  MUSCLE LENGTH: Hamstrings: moderate tightness bil  POSTURE: No Significant postural limitations  PALPATION: No tenderness with palpation in low back or glutes  LUMBAR ROM:   AROM eval  Flexion To mid shin  Extension WNL, "feels good"  Right lateral flexion To knee  Left lateral flexion To knee  Right rotation WNL  Left rotation WNL   (Blank rows = not tested)  LOWER EXTREMITY ROM:   good hip mobility bil, symmetric and pain free.    LOWER EXTREMITY MMT:    MMT Right eval Left eval R 12/22/22 L 12/22/22  Hip flexion 4 4 4  4+  Hip extension      Hip abduction 4+ 4+ 4+ 5  Hip adduction 5 5    Knee flexion 4+ 4+    Knee extension 5 5 5 5   Ankle dorsiflexion 5 5    Ankle plantarflexion       (Blank rows = not tested)  LUMBAR SPECIAL TESTS:  Straight leg raise test: Negative, FABER test: Negative, and Long sit test: Negative Left leg 1/2 inch shorter than Right   FUNCTIONAL TESTS:  5 times sit to stand: 14 seconds without UE assist.   GAIT: Distance walked: 40' Assistive device utilized: None Level of assistance: Complete Independence Comments: no significant deviation  TODAY'S TREATMENT:                                                                                                                              DATE:  12/27/22 Therapeutic Exercise: to improve strength and mobility.  Demo, verbal and tactile cues throughout for technique. Nustep L5 x 6 min  Paloff press doubled RTB 2 x  10 each side Monster walk RTB x 50' each side.  Side stepping RTB x 50' each side Farmer walk 10# 180' each side Forward T's 2 x 10 each Self Care: Standing with 1 foot on small step/shelf Safety with lifting/golfers lift.    12/22/22 Therapeutic Exercise: to improve strength and mobility.  Demo, verbal and tactile cues throughout for technique. Nustep L5 x 6 min  Strength test for bil hips Squats with UE support x 10  Heel raise from 2' book  x 20  Standing hip abduction 2# 2 x 10 bil Standing hip extension 2# 2 x 10 bil Standing hip flexion 2# 2 x 10 bil Multifidus walkout x 10 RTB doubled Pallof press RTB doubled x 10   12/20/2022 Therapeutic Exercise: to improve strength and mobility.  Demo, verbal and tactile cues throughout for technique. Nustep L5 x 6 min  At counter for safety with finger tip support only: Heel raises x 20 Toe raises x 20 Hip extension 2 x 10 each Hip abduction 2 x 10 each Hip flexion 2 x 10 each side Lumbar extension x 15 leaning against counter for support Squats with chair behind for safety 2 x 10 Standing bird dogs 2 x 10 each side Gait in hallway x 10 min with therapist setting rapid pace throughout.   12/15/2022 Therapeutic Exercise: to improve strength and mobility.  Demo, verbal and tactile cues throughout for technique. Nustep L5 x 6 min  At counter for safety with bil UE support: Heel raises 2 x 10 bil  Hip extension 2 x 10 each Hip abduction 2 x 10 each Lumbar extension x 15 leaning against counter for support Squats with chair behind for safety 2 x 15  Standing bird dogs x 10 each side Standing hip hikes from 1" step x 15 each side   12/12/2022 Therapeutic Exercise: to improve strength and mobility.  Demo, verbal and tactile cues throughout for technique. BIKE L1x72min Supine PPT x 10 LTR x 10 each direction Supine trA bicycles x 10 - mod challenge to keep core engaged Supine march B with TrA 10x5" B hip flexion isometric with TrA 10x5"- TrA brace Standing hip abduction x 10 bil RTB Standing hip extension x 10 bil RTB Hip hike from 2' book x 10 bil   PATIENT EDUCATION:  Education details: HEP review Person educated: Patient Education method: Explanation and Verbal cues Education comprehension: verbalized understanding and returned demonstration  HOME EXERCISE PROGRAM: Access Code: [email or web address] URL: [email or web address] Date: 12/27/2022 Prepared by: Harrie Foreman  Exercises - Supine Posterior Pelvic Tilt  - 1 x daily - 7 x weekly - 1 sets - 10 reps - 5 sec hold - Supine Bridge  - 1 x daily - 7 x weekly - 2-3 sets - 10 reps - Active Straight Leg Raise with Quad Set  - 1 x daily - 7 x weekly - 2-3 sets - 10 reps - Clamshell  - 1 x daily - 7 x weekly - 2-3 sets - 10 reps - Beginner Prone Single Leg Raise  - 1 x daily - 7 x weekly - 2-3 sets - 10 reps - Supine March  - 1 x daily - 7 x weekly - 2 sets - 10 reps - 5 sec hold - Isometric Dead Bug  - 1 x daily - 7 x weekly - 2 sets - 10 reps - 5 sec hold - Heel Raises with Counter Support  - 1 x daily - 7 x weekly - 2  sets - 10 reps - Standing Hip Abduction with Counter Support  - 1 x daily - 7 x weekly - 2 sets - 10 reps - Standing Hip Extension with Counter Support  - 1 x daily - 7 x weekly - 2 sets - 10 reps - Mini Squat with Counter Support  - 1 x daily - 7 x weekly - 2 sets - 10 reps - Bird Dog on Counter  - 1 x daily - 7 x weekly - 2 sets - 10 reps - Standing Lumbar Extension with Counter  - 3 x daily - 7 x weekly - 1 sets - 10 reps - Forward T with Counter Support  - 1 x daily - 7 x weekly - 2 sets - 10 reps   ASSESSMENT:  CLINICAL IMPRESSION: Denise Rios still mostly bothered by standing still so discussed trying to prop one foot on shelf while standing in kitchen to relieve pressure - noted that PT were all standing at carts with one foot up on leg of cart for that reason today, she tried in clinic today and will try at home.  Continued core strengthening focusing on stabilization and hip strengthening exercises today.   [name] continues to demonstrate potential for improvement and would benefit from continued skilled therapy to address impairments.        OBJECTIVE IMPAIRMENTS: decreased activity tolerance, decreased endurance, difficulty walking, decreased strength, impaired flexibility, and pain.   ACTIVITY LIMITATIONS: carrying, lifting, standing, squatting, bathing,  dressing, and locomotion level  PARTICIPATION LIMITATIONS: meal prep, cleaning, laundry, shopping, community activity, and yard work  PERSONAL FACTORS: Time since onset of injury/illness/exacerbation and 1-2 comorbidities: osteoporosis, chronic low back pain  are also affecting patient's functional outcome.   REHAB POTENTIAL: Good  CLINICAL DECISION MAKING: Stable/uncomplicated  EVALUATION COMPLEXITY: Low   GOALS: Goals reviewed with patient? Yes  SHORT TERM GOALS: Target date: 12/15/2022   Patient will be independent with initial HEP.  Baseline: needs Goal status: IN PROGRESS 11/29/22- given 12/20/22- met   LONG TERM GOALS: Target date: 01/05/2023    Patient will be independent with advanced/ongoing HEP to improve outcomes and carryover.  Baseline:  Goal status: IN PROGRESS 12/20/22- met for current  2.  Patient will report 75% improvement in low back pain to improve QOL.  Baseline:  Goal status: IN PROGRESS  3. Patient will demonstrate improved functional strength as demonstrated by 5/5 hip strength. Baseline: see objective Goal status: IN PROGRESS- 12/22/22   4.  Patient will report 6 points improvement on modified Oswestry to demonstrate improved functional ability.  Baseline: 12/50 Goal status: IN PROGRESS   5.  Patient will tolerate 15 min of standing to cook. Baseline: increases LBP, has to take breaks Goal status: IN PROGRESS   7.  Patient will be able to walk for 30 min without increased pain for exercise.  Baseline: increases pain Goal status: IN PROGRESS 12/20/22 - no pain after 10 min walk.    PLAN:  PT FREQUENCY: 1-2x/week  PT DURATION: 6 weeks  PLANNED INTERVENTIONS: Therapeutic exercises, Therapeutic activity, Neuromuscular re-education, Balance training, Gait training, Patient/Family education, Self Care, Joint mobilization, Stair training, Orthotic/Fit training, Dry Needling, Electrical stimulation, Spinal manipulation, Spinal mobilization, Cryotherapy,  Moist heat, Taping, Traction, Ultrasound, Manual therapy, and Re-evaluation.  PLAN FOR NEXT SESSION: focus on core strengthening exercises, progress standing tolerance.  PROGRESS NOTE NEEDED   Jena Gauss, PT 12/27/2022, 2:02 PM

## 2022-12-29 ENCOUNTER — Encounter: Payer: Self-pay | Admitting: Physical Therapy

## 2022-12-29 ENCOUNTER — Ambulatory Visit: Payer: Medicare Other | Attending: Family Medicine | Admitting: Physical Therapy

## 2022-12-29 DIAGNOSIS — M6281 Muscle weakness (generalized): Secondary | ICD-10-CM | POA: Diagnosis present

## 2022-12-29 DIAGNOSIS — M5459 Other low back pain: Secondary | ICD-10-CM | POA: Diagnosis present

## 2022-12-29 NOTE — Therapy (Signed)
OUTPATIENT PHYSICAL THERAPY TREATMENT Progress Note Reporting Period 11/24/22 to 12/29/22  See note below for Objective Data and Assessment of Progress/Goals.      Patient Name: ADLEY KOPAS MRN: [id number] DOB:23-Dec-1950, 72 y.o., female Today's Date: 12/29/2022  END OF SESSION:  PT End of Session - 12/29/22 1154     Visit Number 10    Number of Visits 12    Date for PT Re-Evaluation 01/05/23    Authorization Type MCR + Aet[employer] supplement    Progress Note Due on Visit 10    PT Start Time 1102    PT Stop Time 1150    PT Time Calculation (min) 48 min    Activity Tolerance Patient tolerated treatment well    Behavior During Therapy [facility] for tasks assessed/performed                 Past Medical History:  Diagnosis Date   Anhedonia    Anxiety    Arteriosclerosis of carotid artery 03/19/2019   Less than 50%   Chicken pox    Hematuria 12/19/2017   History of fainting spells of unknown cause    Hyperlipidemia    Hypertension    Memory loss    prioor PCP records indicate neuro referral was made   Menopause    Osteoporosis    Rotator cuff (capsule) sprain    R>L   Syncope 09/2018   Vitamin D deficiency    Past Surgical History:  Procedure Laterality Date   CESAREAN SECTION  1980   ELECTROCARDIOGRAM  10/24/2018   SR. HR74, PR 167, QTC 406, NL-EKG   event monitor  01/10/2019   Predominant normal sinus rhythm.  HR 54-1 38.  Average HR 75.  No atrial fib.  Rare ectopic beats.  5 beat episode of nonsustained ventricular tachycardia x1.  No bradycardia arrhythmias.  No pauses.  Normal sinus rhythm without ectopy.   Image: CT chest  10/25/2018   normal- r/o PE   TRANSTHORACIC ECHOCARDIOGRAM  11/26/2018   EF 60-65%.  Normal study.  Completed presyncope.   US CAROTID DOPPLER BILATERAL ([facility] HX)  10/24/2018   Arteriolosclerosis.  Findings consistent with less than 50% stenosis.  Bilateral vertebral blood flow demonstrated.   Patient Active Problem List   Diagnosis  Date Noted   FH: Alzheimer's disease (mother and 6 sibs) 11/10/2022   Sensorineural hearing loss, bilateral 04/19/2022   Lumbar pain 07/30/2021   Anxiousness 02/12/2021   Neck pain 08/12/2020   Osteoporosis 03/19/2019   Benign paroxysmal positional vertigo 02/21/2019   Hyperlipidemia    Hypertension     PCP: Natalia Leatherwood, DO   REFERRING PROVIDER: Rodolph Bong, MD  REFERRING DIAG: M54.50 (ICD-10-CM) - Lumbar pain   Rationale for Evaluation and Treatment: Rehabilitation  THERAPY DIAG:  Other low back pain  Muscle weakness (generalized)  ONSET DATE: chronic x 4 years worsening.   SUBJECTIVE:  SUBJECTIVE STATEMENT:  I was able to be active yesterday from 11-5 and still feel good, didn't even have to take tylenol yesterday. If I have to go to gym every day to feel this good, that's what I need to do.    PERTINENT HISTORY:  HTN, osteoporosis, chronic LBP, hx C-section,   PAIN:  Are you having pain? Yes: NPRS scale: 0/10 Pain location: across low back Pain description: constant sore Aggravating factors: varies, usually standing and walking even short periods Relieving factors: sitting or laying down  PRECAUTIONS: None  WEIGHT BEARING RESTRICTIONS: No  FALLS:  Has patient fallen in last 6 months? No  LIVING ENVIRONMENT: Lives with: lives with their spouse Lives in: House/apartment Stairs: Yes: External: 1 steps; none Has following equipment at home: Grab bars  OCCUPATION: retired   PLOF: Independent  PATIENT GOALS: "bring me up as far as you can"  NEXT MD VISIT: 01/17/23  OBJECTIVE:   DIAGNOSTIC FINDINGS:  11/10/22 DG Lumbar spine  IMPRESSION: Minimal lower lumbar facet arthropathy for age. No acute osseous findings or malalignment.  PATIENT SURVEYS:  Modified Oswestry  12/50 = 24% disability    SCREENING FOR RED FLAGS: Bowel or bladder incontinence: No Spinal tumors: No Cauda equina syndrome: No Compression fracture: No Abdominal aneurysm: No  COGNITION: Overall cognitive status: Within functional limits for tasks assessed     SENSATION: WFL  MUSCLE LENGTH: Hamstrings: moderate tightness bil  POSTURE: No Significant postural limitations  PALPATION: No tenderness with palpation in low back or glutes  LUMBAR ROM:   AROM eval  Flexion To mid shin  Extension WNL, "feels good"  Right lateral flexion To knee  Left lateral flexion To knee  Right rotation WNL  Left rotation WNL   (Blank rows = not tested)  LOWER EXTREMITY ROM:   good hip mobility bil, symmetric and pain free.    LOWER EXTREMITY MMT:    MMT Right eval Left eval R 12/22/22 L 12/22/22  Hip flexion 4 4 4  4+  Hip extension      Hip abduction 4+ 4+ 4+ 5  Hip adduction 5 5    Knee flexion 4+ 4+    Knee extension 5 5 5 5   Ankle dorsiflexion 5 5    Ankle plantarflexion       (Blank rows = not tested)  LUMBAR SPECIAL TESTS:  Straight leg raise test: Negative, FABER test: Negative, and Long sit test: Negative Left leg 1/2 inch shorter than Right   FUNCTIONAL TESTS:  5 times sit to stand: 14 seconds without UE assist.   GAIT: Distance walked: 41' Assistive device utilized: None Level of assistance: Complete Independence Comments: no significant deviation  TODAY'S TREATMENT:                                                                                                                              DATE:   12/29/22 Therapeutic Exercise: to improve strength and mobility.  Demo,  verbal and tactile cues throughout for technique. Gait x 10 min brisk pace throughout Standing hip open/close doors 2 x 10 - 1 UE support Heel raises/ toe raises 2 x 10  Resisted perturbations Forward stepping with arm raise 2 x 10 each side Side step with twist x 10 each side Back step with  reaching 2 x 10 each side Seated on physio ball - marching, hip circles, bounces, LAQ - close SBA for safety.   12/27/22 Therapeutic Exercise: to improve strength and mobility.  Demo, verbal and tactile cues throughout for technique. Nustep L5 x 6 min  Paloff press doubled RTB 2 x 10 each side Monster walk RTB x 50' each side.  Side stepping RTB x 50' each side Farmer walk 10# 180' each side Forward T's 2 x 10 each Self Care: Standing with 1 foot on small step/shelf Safety with lifting/golfers lift.    12/22/22 Therapeutic Exercise: to improve strength and mobility.  Demo, verbal and tactile cues throughout for technique. Nustep L5 x 6 min  Strength test for bil hips Squats with UE support x 10  Heel raise from 2' book x 20  Standing hip abduction 2# 2 x 10 bil Standing hip extension 2# 2 x 10 bil Standing hip flexion 2# 2 x 10 bil Multifidus walkout x 10 RTB doubled Pallof press RTB doubled x 10     PATIENT EDUCATION:  Education details: information on [facility] programs.  Person educated: Patient Education method: Explanation and Handouts Education comprehension: verbalized understanding and returned demonstration  HOME EXERCISE PROGRAM: Access Code: [email or web address] URL: [email or web address] Date: 12/27/2022 Prepared by: Harrie Foreman  Exercises - Supine Posterior Pelvic Tilt  - 1 x daily - 7 x weekly - 1 sets - 10 reps - 5 sec hold - Supine Bridge  - 1 x daily - 7 x weekly - 2-3 sets - 10 reps - Active Straight Leg Raise with Quad Set  - 1 x daily - 7 x weekly - 2-3 sets - 10 reps - Clamshell  - 1 x daily - 7 x weekly - 2-3 sets - 10 reps - Beginner Prone Single Leg Raise  - 1 x daily - 7 x weekly - 2-3 sets - 10 reps - Supine March  - 1 x daily - 7 x weekly - 2 sets - 10 reps - 5 sec hold - Isometric Dead Bug  - 1 x daily - 7 x weekly - 2 sets - 10 reps - 5 sec hold - Heel Raises with Counter Support  - 1 x daily - 7 x weekly - 2 sets - 10 reps -  Standing Hip Abduction with Counter Support  - 1 x daily - 7 x weekly - 2 sets - 10 reps - Standing Hip Extension with Counter Support  - 1 x daily - 7 x weekly - 2 sets - 10 reps - Mini Squat with Counter Support  - 1 x daily - 7 x weekly - 2 sets - 10 reps - Bird Dog on Counter  - 1 x daily - 7 x weekly - 2 sets - 10 reps - Standing Lumbar Extension with Counter  - 3 x daily - 7 x weekly - 1 sets - 10 reps - Forward T with Counter Support  - 1 x daily - 7 x weekly - 2 sets - 10 reps   ASSESSMENT:  CLINICAL IMPRESSION: Bemnet Okerstrom [place] reports 50% improvement overall but significant improvement in activity tolerance.  She still  reports poor tolerance to prolonged standing in kitchen, however noted able to stand and perform exercise in standing in clinic without any signs of discomfort or pain.  Today continued working on activity tolerance, incorporating more functional strengthening and stepping strategy as well for balance and safety.  Also provided information on [facility] programs to help with carryover and motivation for exercise at end of this episode.   [name] continues to demonstrate potential for improvement and would benefit from continued skilled therapy to address impairments.        OBJECTIVE IMPAIRMENTS: decreased activity tolerance, decreased endurance, difficulty walking, decreased strength, impaired flexibility, and pain.   ACTIVITY LIMITATIONS: carrying, lifting, standing, squatting, bathing, dressing, and locomotion level  PARTICIPATION LIMITATIONS: meal prep, cleaning, laundry, shopping, community activity, and yard work  PERSONAL FACTORS: Time since onset of injury/illness/exacerbation and 1-2 comorbidities: osteoporosis, chronic low back pain  are also affecting patient's functional outcome.   REHAB POTENTIAL: Good  CLINICAL DECISION MAKING: Stable/uncomplicated  EVALUATION COMPLEXITY: Low   GOALS: Goals reviewed with patient? Yes  SHORT TERM GOALS:  Target date: 12/15/2022   Patient will be independent with initial HEP.  Baseline: needs Goal status: IN PROGRESS 11/29/22- given 12/20/22- met   LONG TERM GOALS: Target date: 01/05/2023    Patient will be independent with advanced/ongoing HEP to improve outcomes and carryover.  Baseline:  Goal status: IN PROGRESS 12/20/22- met for current  2.  Patient will report 75% improvement in low back pain to improve QOL.  Baseline:  Goal status: IN PROGRESS  3. Patient will demonstrate improved functional strength as demonstrated by 5/5 hip strength. Baseline: see objective Goal status: IN PROGRESS- 12/22/22   4.  Patient will report 6 points improvement on modified Oswestry to demonstrate improved functional ability.  Baseline: 12/50 Goal status: IN PROGRESS   5.  Patient will tolerate 15 min of standing to cook. Baseline: increases LBP, has to take breaks Goal status: IN PROGRESS    7.  Patient will be able to walk for 30 min without increased pain for exercise.  Baseline: increases pain Goal status: IN PROGRESS 12/20/22 - no pain after 10 min walk.    PLAN:  PT FREQUENCY: 1-2x/week  PT DURATION: 6 weeks  PLANNED INTERVENTIONS: Therapeutic exercises, Therapeutic activity, Neuromuscular re-education, Balance training, Gait training, Patient/Family education, Self Care, Joint mobilization, Stair training, Orthotic/Fit training, Dry Needling, Electrical stimulation, Spinal manipulation, Spinal mobilization, Cryotherapy, Moist heat, Taping, Traction, Ultrasound, Manual therapy, and Re-evaluation.  PLAN FOR NEXT SESSION: focus on core strengthening exercises, progress standing tolerance.  Swiss ball next session - safety in home.    Jena Gauss, PT, DPT  12/29/2022, 12:02 PM

## 2023-01-02 ENCOUNTER — Encounter: Payer: Self-pay | Admitting: Physical Therapy

## 2023-01-02 ENCOUNTER — Ambulatory Visit: Payer: Medicare Other | Admitting: Physical Therapy

## 2023-01-02 DIAGNOSIS — M5459 Other low back pain: Secondary | ICD-10-CM | POA: Diagnosis not present

## 2023-01-02 DIAGNOSIS — M6281 Muscle weakness (generalized): Secondary | ICD-10-CM

## 2023-01-02 NOTE — Therapy (Signed)
OUTPATIENT PHYSICAL THERAPY TREATMENT   Patient Name: Denise Rios MRN: [id number] DOB:10-02-1950, 72 y.o., female Today's Date: 01/02/2023  END OF SESSION:  PT End of Session - 01/02/23 1100     Visit Number 11    Number of Visits 12    Date for PT Re-Evaluation 01/05/23    Authorization Type MCR + Aetna supplement    Progress Note Due on Visit 10    PT Start Time 1101    PT Stop Time 1145    PT Time Calculation (min) 44 min    Activity Tolerance Patient tolerated treatment well    Behavior During Therapy [facility] for tasks assessed/performed                 Past Medical History:  Diagnosis Date   Anhedonia    Anxiety    Arteriosclerosis of carotid artery 03/19/2019   Less than 50%   Chicken pox    Hematuria 12/19/2017   History of fainting spells of unknown cause    Hyperlipidemia    Hypertension    Memory loss    prioor PCP records indicate neuro referral was made   Menopause    Osteoporosis    Rotator cuff (capsule) sprain    R>L   Syncope 09/2018   Vitamin D deficiency    Past Surgical History:  Procedure Laterality Date   CESAREAN SECTION  1980   ELECTROCARDIOGRAM  10/24/2018   SR. HR74, PR 167, QTC 406, NL-EKG   event monitor  01/10/2019   Predominant normal sinus rhythm.  HR 54-1 38.  Average HR 75.  No atrial fib.  Rare ectopic beats.  5 beat episode of nonsustained ventricular tachycardia x1.  No bradycardia arrhythmias.  No pauses.  Normal sinus rhythm without ectopy.   Image: CT chest  10/25/2018   normal- r/o PE   TRANSTHORACIC ECHOCARDIOGRAM  11/26/2018   EF 60-65%.  Normal study.  Completed presyncope.   US CAROTID DOPPLER BILATERAL ([facility] HX)  10/24/2018   Arteriolosclerosis.  Findings consistent with less than 50% stenosis.  Bilateral vertebral blood flow demonstrated.   Patient Active Problem List   Diagnosis Date Noted   FH: Alzheimer's disease (mother and 6 sibs) 11/10/2022   Sensorineural hearing loss, bilateral 04/19/2022   Lumbar  pain 07/30/2021   Anxiousness 02/12/2021   Neck pain 08/12/2020   Osteoporosis 03/19/2019   Benign paroxysmal positional vertigo 02/21/2019   Hyperlipidemia    Hypertension     PCP: Natalia Leatherwood, DO   REFERRING PROVIDER: Rodolph Bong, MD  REFERRING DIAG: M54.50 (ICD-10-CM) - Lumbar pain   Rationale for Evaluation and Treatment: Rehabilitation  THERAPY DIAG:  Other low back pain  Muscle weakness (generalized)  ONSET DATE: chronic x 4 years worsening.   SUBJECTIVE:  SUBJECTIVE STATEMENT:  I had pain in my back after PT last week, but the day after that my back was fine.  Getting ready for a big anniversary party on Thursday.   PERTINENT HISTORY:  HTN, osteoporosis, chronic LBP, hx C-section,   PAIN:  Are you having pain? Yes: NPRS scale: 0/10 Pain location: across low back Pain description: constant sore Aggravating factors: varies, usually standing and walking even short periods Relieving factors: sitting or laying down  PRECAUTIONS: None  WEIGHT BEARING RESTRICTIONS: No  FALLS:  Has patient fallen in last 6 months? No  LIVING ENVIRONMENT: Lives with: lives with their spouse Lives in: House/apartment Stairs: Yes: External: 1 steps; none Has following equipment at home: Grab bars  OCCUPATION: retired   PLOF: Independent  PATIENT GOALS: "bring me up as far as you can"  NEXT MD VISIT: 01/17/23  OBJECTIVE:   DIAGNOSTIC FINDINGS:  11/10/22 DG Lumbar spine  IMPRESSION: Minimal lower lumbar facet arthropathy for age. No acute osseous findings or malalignment.  PATIENT SURVEYS:  Modified Oswestry 12/50 = 24% disability    SCREENING FOR RED FLAGS: Bowel or bladder incontinence: No Spinal tumors: No Cauda equina syndrome: No Compression fracture: No Abdominal aneurysm:  No  COGNITION: Overall cognitive status: Within functional limits for tasks assessed     SENSATION: WFL  MUSCLE LENGTH: Hamstrings: moderate tightness bil  POSTURE: No Significant postural limitations  PALPATION: No tenderness with palpation in low back or glutes  LUMBAR ROM:   AROM eval  Flexion To mid shin  Extension WNL, "feels good"  Right lateral flexion To knee  Left lateral flexion To knee  Right rotation WNL  Left rotation WNL   (Blank rows = not tested)  LOWER EXTREMITY ROM:   good hip mobility bil, symmetric and pain free.    LOWER EXTREMITY MMT:    MMT Right eval Left eval R 12/22/22 L 12/22/22  Hip flexion 4 4 4  4+  Hip extension      Hip abduction 4+ 4+ 4+ 5  Hip adduction 5 5    Knee flexion 4+ 4+    Knee extension 5 5 5 5   Ankle dorsiflexion 5 5    Ankle plantarflexion       (Blank rows = not tested)  LUMBAR SPECIAL TESTS:  Straight leg raise test: Negative, FABER test: Negative, and Long sit test: Negative Left leg 1/2 inch shorter than Right   FUNCTIONAL TESTS:  5 times sit to stand: 14 seconds without UE assist.   GAIT: Distance walked: 35' Assistive device utilized: None Level of assistance: Complete Independence Comments: no significant deviation  TODAY'S TREATMENT:                                                                                                                              DATE:   01/02/2023 Therapeutic Exercise: to improve strength and mobility.  Demo, verbal and tactile cues throughout for technique. On physioball in corner,  next to chair for safety, with SBA: Nustep Level 5 x 6 min Seated ball circles, figure 8's, marching, LAQ Gait x 5 min  Self Care: Discussion of importance of regular physical exercise, walking, steps, evidence of walking, activity and back pain.   12/29/22 Therapeutic Exercise: to improve strength and mobility.  Demo, verbal and tactile cues throughout for technique. Gait x 10 min brisk pace  throughout Standing hip open/close doors 2 x 10 - 1 UE support Heel raises/ toe raises 2 x 10  Resisted perturbations Forward stepping with arm raise 2 x 10 each side Side step with twist x 10 each side Back step with reaching 2 x 10 each side Seated on physio ball - marching, hip circles, bounces, LAQ - close SBA for safety.   12/27/22 Therapeutic Exercise: to improve strength and mobility.  Demo, verbal and tactile cues throughout for technique. Nustep L5 x 6 min  Paloff press doubled RTB 2 x 10 each side Monster walk RTB x 50' each side.  Side stepping RTB x 50' each side Farmer walk 10# 180' each side Forward T's 2 x 10 each Self Care: Standing with 1 foot on small step/shelf Safety with lifting/golfers lift.    12/22/22 Therapeutic Exercise: to improve strength and mobility.  Demo, verbal and tactile cues throughout for technique. Nustep L5 x 6 min  Strength test for bil hips Squats with UE support x 10  Heel raise from 2' book x 20  Standing hip abduction 2# 2 x 10 bil Standing hip extension 2# 2 x 10 bil Standing hip flexion 2# 2 x 10 bil Multifidus walkout x 10 RTB doubled Pallof press RTB doubled x 10     PATIENT EDUCATION:  Education details: information on [facility] programs.  Person educated: Patient Education method: Explanation and Handouts Education comprehension: verbalized understanding and returned demonstration  HOME EXERCISE PROGRAM: Access Code: [email or web address] URL: [email or web address] Date: 12/27/2022 Prepared by: Harrie Foreman  Exercises - Supine Posterior Pelvic Tilt  - 1 x daily - 7 x weekly - 1 sets - 10 reps - 5 sec hold - Supine Bridge  - 1 x daily - 7 x weekly - 2-3 sets - 10 reps - Active Straight Leg Raise with Quad Set  - 1 x daily - 7 x weekly - 2-3 sets - 10 reps - Clamshell  - 1 x daily - 7 x weekly - 2-3 sets - 10 reps - Beginner Prone Single Leg Raise  - 1 x daily - 7 x weekly - 2-3 sets - 10 reps - Supine March  - 1  x daily - 7 x weekly - 2 sets - 10 reps - 5 sec hold - Isometric Dead Bug  - 1 x daily - 7 x weekly - 2 sets - 10 reps - 5 sec hold - Heel Raises with Counter Support  - 1 x daily - 7 x weekly - 2 sets - 10 reps - Standing Hip Abduction with Counter Support  - 1 x daily - 7 x weekly - 2 sets - 10 reps - Standing Hip Extension with Counter Support  - 1 x daily - 7 x weekly - 2 sets - 10 reps - Mini Squat with Counter Support  - 1 x daily - 7 x weekly - 2 sets - 10 reps - Bird Dog on Counter  - 1 x daily - 7 x weekly - 2 sets - 10 reps - Standing Lumbar Extension with Counter  - 3  x daily - 7 x weekly - 1 sets - 10 reps - Forward T with Counter Support  - 1 x daily - 7 x weekly - 2 sets - 10 reps   ASSESSMENT:  CLINICAL IMPRESSION: [name] reported 1 day of elevated back pain, but then decreased pain the next week.  We discussed that the goal with increased activity that pain should continue to decrease and her activity tolerance should continue to improve, but that isn't always a straight line, but it is a good sign that she did feel better the next day.  Today focused on ball exercises which she enjoyed and could do while watching TV (as long as ball in corner, set up next to chair also to hold onto and help up for safety) and discussed importance of exercise for core strengthening and evidence for walking and back pain, continuing to encourage her to get out of home and find activity she enjoys to encourage follow-through.  No pain reported at end of session.   Plan to discharge or hold next session.       OBJECTIVE IMPAIRMENTS: decreased activity tolerance, decreased endurance, difficulty walking, decreased strength, impaired flexibility, and pain.   ACTIVITY LIMITATIONS: carrying, lifting, standing, squatting, bathing, dressing, and locomotion level  PARTICIPATION LIMITATIONS: meal prep, cleaning, laundry, shopping, community activity, and yard work  PERSONAL FACTORS: Time since  onset of injury/illness/exacerbation and 1-2 comorbidities: osteoporosis, chronic low back pain  are also affecting patient's functional outcome.   REHAB POTENTIAL: Good  CLINICAL DECISION MAKING: Stable/uncomplicated  EVALUATION COMPLEXITY: Low   GOALS: Goals reviewed with patient? Yes  SHORT TERM GOALS: Target date: 12/15/2022   Patient will be independent with initial HEP.  Baseline: needs Goal status: IN PROGRESS 11/29/22- given 12/20/22- met   LONG TERM GOALS: Target date: 01/05/2023    Patient will be independent with advanced/ongoing HEP to improve outcomes and carryover.  Baseline:  Goal status: IN PROGRESS 12/20/22- met for current  2.  Patient will report 75% improvement in low back pain to improve QOL.  Baseline:  Goal status: IN PROGRESS  3. Patient will demonstrate improved functional strength as demonstrated by 5/5 hip strength. Baseline: see objective Goal status: IN PROGRESS- 12/22/22   4.  Patient will report 6 points improvement on modified Oswestry to demonstrate improved functional ability.  Baseline: 12/50 Goal status: IN PROGRESS   5.  Patient will tolerate 15 min of standing to cook. Baseline: increases LBP, has to take breaks Goal status: IN PROGRESS    7.  Patient will be able to walk for 30 min without increased pain for exercise.  Baseline: increases pain Goal status: IN PROGRESS 12/20/22 - no pain after 10 min walk.    PLAN:  PT FREQUENCY: 1-2x/week  PT DURATION: 6 weeks  PLANNED INTERVENTIONS: Therapeutic exercises, Therapeutic activity, Neuromuscular re-education, Balance training, Gait training, Patient/Family education, Self Care, Joint mobilization, Stair training, Orthotic/Fit training, Dry Needling, Electrical stimulation, Spinal manipulation, Spinal mobilization, Cryotherapy, Moist heat, Taping, Traction, Ultrasound, Manual therapy, and Re-evaluation.  PLAN FOR NEXT SESSION: d/c or hold.   Jena Gauss, PT, DPT  01/02/2023, 1:21  PM

## 2023-01-04 ENCOUNTER — Encounter (HOSPITAL_BASED_OUTPATIENT_CLINIC_OR_DEPARTMENT_OTHER): Payer: Self-pay

## 2023-01-04 ENCOUNTER — Ambulatory Visit (HOSPITAL_BASED_OUTPATIENT_CLINIC_OR_DEPARTMENT_OTHER)
Admission: RE | Admit: 2023-01-04 | Discharge: 2023-01-04 | Disposition: A | Discharge status: Home / Self Care | Payer: Medicare Other | Source: Ambulatory Visit | Attending: Family Medicine | Admitting: Family Medicine

## 2023-01-04 DIAGNOSIS — Z1231 Encounter for screening mammogram for malignant neoplasm of breast: Secondary | ICD-10-CM | POA: Insufficient documentation

## 2023-01-04 DIAGNOSIS — E559 Vitamin D deficiency, unspecified: Secondary | ICD-10-CM

## 2023-01-04 DIAGNOSIS — M810 Age-related osteoporosis without current pathological fracture: Secondary | ICD-10-CM

## 2023-01-05 ENCOUNTER — Encounter: Payer: Medicare Other | Admitting: Physical Therapy

## 2023-01-05 ENCOUNTER — Encounter: Payer: Self-pay | Admitting: Physical Therapy

## 2023-01-05 ENCOUNTER — Ambulatory Visit: Payer: Medicare Other | Admitting: Physical Therapy

## 2023-01-05 DIAGNOSIS — M6281 Muscle weakness (generalized): Secondary | ICD-10-CM

## 2023-01-05 DIAGNOSIS — M5459 Other low back pain: Secondary | ICD-10-CM

## 2023-01-05 NOTE — Therapy (Addendum)
OUTPATIENT PHYSICAL THERAPY TREATMENT/Discharge Summary   Patient Name: Denise Rios MRN: [id number] DOB:09-Nov-1950, 72 y.o., female Today's Date: 01/05/2023  END OF SESSION:  PT End of Session - 01/05/23 1444     Visit Number 12    Number of Visits 12    Date for PT Re-Evaluation 01/05/23    Authorization Type MCR + [employer] supplement    Progress Note Due on Visit 10    PT Start Time 1445    PT Stop Time 1533    PT Time Calculation (min) 48 min    Activity Tolerance Patient tolerated treatment well    Behavior During Therapy [facility] for tasks assessed/performed                 Past Medical History:  Diagnosis Date   Anhedonia    Anxiety    Arteriosclerosis of carotid artery 03/19/2019   Less than 50%   Chicken pox    Hematuria 12/19/2017   History of fainting spells of unknown cause    Hyperlipidemia    Hypertension    Memory loss    prioor PCP records indicate neuro referral was made   Menopause    Osteoporosis    Rotator cuff (capsule) sprain    R>L   Syncope 09/2018   Vitamin D deficiency    Past Surgical History:  Procedure Laterality Date   CESAREAN SECTION  1980   ELECTROCARDIOGRAM  10/24/2018   SR. HR74, PR 167, QTC 406, NL-EKG   event monitor  01/10/2019   Predominant normal sinus rhythm.  HR 54-1 38.  Average HR 75.  No atrial fib.  Rare ectopic beats.  5 beat episode of nonsustained ventricular tachycardia x1.  No bradycardia arrhythmias.  No pauses.  Normal sinus rhythm without ectopy.   Image: CT chest  10/25/2018   normal- r/o PE   TRANSTHORACIC ECHOCARDIOGRAM  11/26/2018   EF 60-65%.  Normal study.  Completed presyncope.   US CAROTID DOPPLER BILATERAL ([facility] HX)  10/24/2018   Arteriolosclerosis.  Findings consistent with less than 50% stenosis.  Bilateral vertebral blood flow demonstrated.   Patient Active Problem List   Diagnosis Date Noted   FH: Alzheimer's disease (mother and 6 sibs) 11/10/2022   Sensorineural hearing loss, bilateral  04/19/2022   Lumbar pain 07/30/2021   Anxiousness 02/12/2021   Neck pain 08/12/2020   Osteoporosis 03/19/2019   Benign paroxysmal positional vertigo 02/21/2019   Hyperlipidemia    Hypertension     PCP: Natalia Leatherwood, DO   REFERRING PROVIDER: Rodolph Bong, MD  REFERRING DIAG: M54.50 (ICD-10-CM) - Lumbar pain   Rationale for Evaluation and Treatment: Rehabilitation  THERAPY DIAG:  Other low back pain  Muscle weakness (generalized)  ONSET DATE: chronic x 4 years worsening.   SUBJECTIVE:  SUBJECTIVE STATEMENT:  Doing well today.    PERTINENT HISTORY:  HTN, osteoporosis, chronic LBP, hx C-section,   PAIN:  Are you having pain? Yes: NPRS scale: 0/10 Pain location: across low back Pain description: constant sore Aggravating factors: varies, usually standing and walking even short periods Relieving factors: sitting or laying down  PRECAUTIONS: None  WEIGHT BEARING RESTRICTIONS: No  FALLS:  Has patient fallen in last 6 months? No  LIVING ENVIRONMENT: Lives with: lives with their spouse Lives in: House/apartment Stairs: Yes: External: 1 steps; none Has following equipment at home: Grab bars  OCCUPATION: retired   PLOF: Independent  PATIENT GOALS: "bring me up as far as you can"  NEXT MD VISIT: 01/17/23  OBJECTIVE:   DIAGNOSTIC FINDINGS:  11/10/22 DG Lumbar spine  IMPRESSION: Minimal lower lumbar facet arthropathy for age. No acute osseous findings or malalignment.  PATIENT SURVEYS:  Modified Oswestry 12/50 = 24% disability    SCREENING FOR RED FLAGS: Bowel or bladder incontinence: No Spinal tumors: No Cauda equina syndrome: No Compression fracture: No Abdominal aneurysm: No  COGNITION: Overall cognitive status: Within functional limits for tasks  assessed     SENSATION: WFL  MUSCLE LENGTH: Hamstrings: moderate tightness bil  POSTURE: No Significant postural limitations  PALPATION: No tenderness with palpation in low back or glutes  LUMBAR ROM:   AROM eval  Flexion To mid shin  Extension WNL, "feels good"  Right lateral flexion To knee  Left lateral flexion To knee  Right rotation WNL  Left rotation WNL   (Blank rows = not tested)  LOWER EXTREMITY ROM:   good hip mobility bil, symmetric and pain free.    LOWER EXTREMITY MMT:    MMT Right eval Left eval R 12/22/22 L 12/22/22  Hip flexion 4 4 4  4+  Hip extension      Hip abduction 4+ 4+ 4+ 5  Hip adduction 5 5    Knee flexion 4+ 4+    Knee extension 5 5 5 5   Ankle dorsiflexion 5 5    Ankle plantarflexion       (Blank rows = not tested)  LUMBAR SPECIAL TESTS:  Straight leg raise test: Negative, FABER test: Negative, and Long sit test: Negative Left leg 1/2 inch shorter than Right   FUNCTIONAL TESTS:  5 times sit to stand: 14 seconds without UE assist.   GAIT: Distance walked: 72' Assistive device utilized: None Level of assistance: Complete Independence Comments: no significant deviation  TODAY'S TREATMENT:                                                                                                                              DATE:   01/05/2023 Therapeutic Exercise: to improve strength and mobility.  Demo, verbal and tactile cues throughout for technique. Nustep Level 5 x 6 min On physioball in corner, next to chair for safety, with SBA: -Seated ball bounces, circles, marching, LAQ Seated ball rollouts - forward and  to side for stretches.  Standing hip extension while leaning on ball - SBA for safety Therapeutic Activity:  review of goals, Modified Oswestry.   01/02/2023 Therapeutic Exercise: to improve strength and mobility.  Demo, verbal and tactile cues throughout for technique. Nustep Level 5 x 6 min On physioball in corner, next to chair  for safety, with SBA: Seated ball circles, figure 8's, marching, LAQ Gait x 5 min  Self Care: Discussion of importance of regular physical exercise, walking, steps, evidence of walking, activity and back pain.   12/29/22 Therapeutic Exercise: to improve strength and mobility.  Demo, verbal and tactile cues throughout for technique. Gait x 10 min brisk pace throughout Standing hip open/close doors 2 x 10 - 1 UE support Heel raises/ toe raises 2 x 10  Resisted perturbations Forward stepping with arm raise 2 x 10 each side Side step with twist x 10 each side Back step with reaching 2 x 10 each side Seated on physio ball - marching, hip circles, bounces, LAQ - close SBA for safety.    PATIENT EDUCATION:  Education details: information on Sr center programs.  Person educated: Patient Education method: Explanation and Handouts Education comprehension: verbalized understanding and returned demonstration  HOME EXERCISE PROGRAM: Access Code: [email or web address] URL: [email or web address] Date: 12/27/2022 Prepared by: Harrie Foreman  Exercises - Supine Posterior Pelvic Tilt  - 1 x daily - 7 x weekly - 1 sets - 10 reps - 5 sec hold - Supine Bridge  - 1 x daily - 7 x weekly - 2-3 sets - 10 reps - Active Straight Leg Raise with Quad Set  - 1 x daily - 7 x weekly - 2-3 sets - 10 reps - Clamshell  - 1 x daily - 7 x weekly - 2-3 sets - 10 reps - Beginner Prone Single Leg Raise  - 1 x daily - 7 x weekly - 2-3 sets - 10 reps - Supine March  - 1 x daily - 7 x weekly - 2 sets - 10 reps - 5 sec hold - Isometric Dead Bug  - 1 x daily - 7 x weekly - 2 sets - 10 reps - 5 sec hold - Heel Raises with Counter Support  - 1 x daily - 7 x weekly - 2 sets - 10 reps - Standing Hip Abduction with Counter Support  - 1 x daily - 7 x weekly - 2 sets - 10 reps - Standing Hip Extension with Counter Support  - 1 x daily - 7 x weekly - 2 sets - 10 reps - Mini Squat with Counter Support  - 1 x daily - 7 x weekly  - 2 sets - 10 reps - Bird Dog on Counter  - 1 x daily - 7 x weekly - 2 sets - 10 reps - Standing Lumbar Extension with Counter  - 3 x daily - 7 x weekly - 1 sets - 10 reps - Forward T with Counter Support  - 1 x daily - 7 x weekly - 2 sets - 10 reps   ASSESSMENT:  CLINICAL IMPRESSION: AILYNN GOW reports having more and more good days in terms of back pain, also reports noticing she is taking less pain medication.  Still limited with prolonged standing, but this varies.  She does recognize how exercise and strengthening her core has improved her back pain and allows her to do more with less pain overall.  She enjoys the ball exercises and plans to do these at home.  Her Modified Oswestry has improved from 12/50 to 7/50 (moderate to minimal disability).  At this time she is agreeable to 30 day hold based on progress and independent with HEP.        OBJECTIVE IMPAIRMENTS: decreased activity tolerance, decreased endurance, difficulty walking, decreased strength, impaired flexibility, and pain.   ACTIVITY LIMITATIONS: carrying, lifting, standing, squatting, bathing, dressing, and locomotion level  PARTICIPATION LIMITATIONS: meal prep, cleaning, laundry, shopping, community activity, and yard work  PERSONAL FACTORS: Time since onset of injury/illness/exacerbation and 1-2 comorbidities: osteoporosis, chronic low back pain  are also affecting patient's functional outcome.   REHAB POTENTIAL: Good  CLINICAL DECISION MAKING: Stable/uncomplicated  EVALUATION COMPLEXITY: Low   GOALS: Goals reviewed with patient? Yes  SHORT TERM GOALS: Target date: 12/15/2022   Patient will be independent with initial HEP.  Baseline: needs Goal status: IN PROGRESS 11/29/22- given 12/20/22- met   LONG TERM GOALS: Target date: 01/05/2023    Patient will be independent with advanced/ongoing HEP to improve outcomes and carryover.  Baseline:  Goal status: MET 01/05/23  2.  Patient will report 75% improvement in  low back pain to improve QOL.  Baseline:  Goal status: IN PROGRESS 01/05/23 - 60%  3. Patient will demonstrate improved functional strength as demonstrated by 5/5 hip strength. Baseline: see objective Goal status: MET- 01/05/23 5/5 bil hip strength  4.  Patient will report 6 points improvement on modified Oswestry to demonstrate improved functional ability.  Baseline: 12/50 Goal status: IN PROGRESS  01/05/23 7/50  5.  Patient will tolerate 15 min of standing to cook. Baseline: increases LBP, has to take breaks Goal status: IN PROGRESS  01/05/23 - still hurts but time varies, sometimes can do a lot, sometimes less  7.  Patient will be able to walk for 30 min without increased pain for exercise.  Baseline: increases pain Goal status: IN PROGRESS 12/20/22 - no pain after 10 min walk.     PLAN:  PT FREQUENCY: 1-2x/week  PT DURATION: 6 weeks  PLANNED INTERVENTIONS: Therapeutic exercises, Therapeutic activity, Neuromuscular re-education, Balance training, Gait training, Patient/Family education, Self Care, Joint mobilization, Stair training, Orthotic/Fit training, Dry Needling, Electrical stimulation, Spinal manipulation, Spinal mobilization, Cryotherapy, Moist heat, Taping, Traction, Ultrasound, Manual therapy, and Re-evaluation.  PLAN FOR NEXT SESSION: 30 day hold.   Jena Gauss, PT, DPT  01/05/2023, 4:01 PM   PHYSICAL THERAPY DISCHARGE SUMMARY  Visits from Start of Care: 12  Current functional level related to goals / functional outcomes: 60% improvement in LBP   Remaining deficits: Increased LBP with prolonged standing   Education / Equipment: HEP  Plan:  Patient was placed on hold for 30 days on 01/05/23 and has not needed to return to PT, therefore will proceed with discharge from PT for this episode.      Jena Gauss, PT, DPT 03/09/2023 2:26 PM

## 2023-01-17 ENCOUNTER — Encounter: Payer: Self-pay | Admitting: Family Medicine

## 2023-01-17 ENCOUNTER — Ambulatory Visit (INDEPENDENT_AMBULATORY_CARE_PROVIDER_SITE_OTHER): Payer: Medicare Other | Admitting: Family Medicine

## 2023-01-17 VITALS — BP 142/86 | HR 66 | Ht 62.0 in | Wt 138.0 lb

## 2023-01-17 DIAGNOSIS — M545 Low back pain, unspecified: Secondary | ICD-10-CM | POA: Diagnosis not present

## 2023-01-17 NOTE — Progress Notes (Signed)
   I, Stevenson Clinch, CMA acting as a scribe for Clementeen Graham, MD.  Denise Rios is a 72 y.o. female who presents to [facility] today for 8-wk f/u LBP. Pt was last seen by Dr. Denyse Amass on 11/22/22 and she was referred to [facility] and PT, completing 12 visits.   Today, pt reports continued lower back pain, some improvement with sx with PT. Has been busy lately so hasn't been compliant with HEP. Denies new or worsening sx since last visit. She is scheduled with Rheumatology next week.   Dx testing: 11/10/22 L-spine XR & labs   Pertinent review of systems: No fevers or chills  Relevant historical information: Hypertension osteoporosis.   Exam:  BP (!) 142/86   Pulse 66   Ht 5\' 2"  (1.575 m)   Wt 138 lb (62.6 kg)   SpO2 99%   BMI 25.24 kg/m  General: Well Developed, well nourished, and in no acute distress.   MSK: L-spine nontender midline decreased lumbar motion.    Lab and Radiology Results  EXAM: LUMBAR SPINE - COMPLETE 4+ VIEW   COMPARISON:  Lumbar spine radiographs 03/30/2020.   FINDINGS: There are 5 lumbar type vertebral bodies. The alignment is normal. The disc spaces are preserved. No evidence of acute fracture or pars defect. Minimal lower lumbar facet arthropathy for age. The sacroiliac joints appear unremarkable. Mildly prominent stool noted throughout the colon.   IMPRESSION: Minimal lower lumbar facet arthropathy for age. No acute osseous findings or malalignment.     Electronically Signed   By: Carey Bullocks M.D.   On: 11/15/2022 10:51 I, Clementeen Graham, personally (independently) visualized and performed the interpretation of the images attached in this note.     Assessment and Plan: 72 y.o. female with chronic low back pain.  Some improvement with physical therapy.  She still has some pain which could be evaluated more thoroughly with an MRI lumbar spine for potential facet injection planning.  She  would like to continue home exercise program and limited PT.  I recommend give it another month and if not good enough she will let me know and I can proceed to MRI lumbar spine for potential facet injection planning.   PDMP not reviewed this encounter. No orders of the defined types were placed in this encounter.  No orders of the defined types were placed in this encounter.    Discussed warning signs or symptoms. Please see discharge instructions. Patient expresses understanding.   The above documentation has been reviewed and is accurate and complete Clementeen Graham, M.D.

## 2023-01-17 NOTE — Patient Instructions (Signed)
Thank you for coming in today.   Continue home exercises.   If not good enough let me know and I can order an MRI.   I suggest giving it a month.   Let me know how it goes.

## 2023-03-21 ENCOUNTER — Other Ambulatory Visit: Payer: Self-pay | Admitting: Family Medicine

## 2023-04-10 ENCOUNTER — Ambulatory Visit (HOSPITAL_BASED_OUTPATIENT_CLINIC_OR_DEPARTMENT_OTHER)
Admission: RE | Admit: 2023-04-10 | Discharge: 2023-04-10 | Disposition: A | Discharge status: Home / Self Care | Payer: Medicare Other | Source: Ambulatory Visit | Attending: Family Medicine | Admitting: Family Medicine

## 2023-04-10 ENCOUNTER — Telehealth: Payer: Self-pay | Admitting: Family Medicine

## 2023-04-10 DIAGNOSIS — E559 Vitamin D deficiency, unspecified: Secondary | ICD-10-CM | POA: Diagnosis present

## 2023-04-10 DIAGNOSIS — M810 Age-related osteoporosis without current pathological fracture: Secondary | ICD-10-CM | POA: Diagnosis present

## 2023-04-10 NOTE — Telephone Encounter (Signed)
Left pt a VM to return my call  

## 2023-04-10 NOTE — Telephone Encounter (Signed)
Please call patient The bone density in her spine slightly decreased from prior reading from 2022. The bone density in her femur/hip is about the same.    Continue Fosamax weekly. Weightbearing exercise such as walking on a hard surface, 15 to 20 minutes a day can be helpful. Limit caffeine and alcohol use. Maintain adequate vitamin D and calcium levels.  Vitamin D levels were normal in July 2024.   If she is not taking a vitamin D/calcium supplement, 1000-1200 mg of calcium daily in the diet is recommended (or by supplement) and vitamin D 1000 units daily.  Again this if she is not taking a supplement already

## 2023-04-19 ENCOUNTER — Encounter: Payer: Medicare Other | Admitting: Rheumatology

## 2023-05-17 ENCOUNTER — Ambulatory Visit: Payer: Medicare Other | Admitting: Rheumatology

## 2023-06-08 ENCOUNTER — Encounter: Payer: Self-pay | Admitting: Family Medicine

## 2023-06-08 ENCOUNTER — Ambulatory Visit (INDEPENDENT_AMBULATORY_CARE_PROVIDER_SITE_OTHER): Payer: Medicare Other | Admitting: Family Medicine

## 2023-06-08 VITALS — BP 122/80 | HR 74 | Temp 97.7°F | Wt 142.2 lb

## 2023-06-08 DIAGNOSIS — E782 Mixed hyperlipidemia: Secondary | ICD-10-CM

## 2023-06-08 DIAGNOSIS — M545 Low back pain, unspecified: Secondary | ICD-10-CM

## 2023-06-08 DIAGNOSIS — M810 Age-related osteoporosis without current pathological fracture: Secondary | ICD-10-CM

## 2023-06-08 DIAGNOSIS — I10 Essential (primary) hypertension: Secondary | ICD-10-CM

## 2023-06-08 DIAGNOSIS — Z820 Family history of epilepsy and other diseases of the nervous system: Secondary | ICD-10-CM

## 2023-06-08 DIAGNOSIS — F419 Anxiety disorder, unspecified: Secondary | ICD-10-CM | POA: Diagnosis not present

## 2023-06-08 DIAGNOSIS — M542 Cervicalgia: Secondary | ICD-10-CM

## 2023-06-08 DIAGNOSIS — H903 Sensorineural hearing loss, bilateral: Secondary | ICD-10-CM

## 2023-06-08 DIAGNOSIS — L578 Other skin changes due to chronic exposure to nonionizing radiation: Secondary | ICD-10-CM | POA: Insufficient documentation

## 2023-06-08 DIAGNOSIS — H8110 Benign paroxysmal vertigo, unspecified ear: Secondary | ICD-10-CM

## 2023-06-08 MED ORDER — ROSUVASTATIN CALCIUM 20 MG PO TABS: ORAL_TABLET | ORAL | 1 refills | Status: DC

## 2023-06-08 MED ORDER — DICLOFENAC SODIUM 75 MG PO TBEC: 75.0000 mg | DELAYED_RELEASE_TABLET | Freq: Two times a day (BID) | ORAL | 1 refills | Status: DC

## 2023-06-08 MED ORDER — AMLODIPINE BESYLATE 5 MG PO TABS: 5.0000 mg | ORAL_TABLET | Freq: Every day | ORAL | 1 refills | Status: DC

## 2023-06-08 MED ORDER — BUPROPION HCL ER (XL) 300 MG PO TB24: ORAL_TABLET | ORAL | 1 refills | Status: DC

## 2023-06-08 MED ORDER — TRIAMTERENE-HCTZ 37.5-25 MG PO TABS: 1.0000 | ORAL_TABLET | Freq: Every day | ORAL | 1 refills | Status: DC

## 2023-06-08 NOTE — Patient Instructions (Addendum)
 Return in about 24 weeks (around 11/23/2023) for Routine chronic condition follow-up.        Great to see you today.  I have refilled the medication(s) we provide.   If labs were collected or images ordered, we will inform you of  results once we have received them and reviewed. We will contact you either by echart message, or telephone call.  Please give ample time to the testing facility, and our office to run,  receive and review results. Please do not call inquiring of results, even if you can see them in your chart. We will contact you as soon as we are able. If it has been over 1 week since the test was completed, and you have not yet heard from us , then please call us .    - echart message- for normal results that have been seen by the patient already.   - telephone call: abnormal results or if patient has not viewed results in their echart.  If a referral to a specialist was entered for you, please call us  in 2 weeks if you have not heard from the specialist office to schedule.

## 2023-06-08 NOTE — Progress Notes (Signed)
 Patient ID: Denise Rios, female  DOB: 03-Nov-1950, 73 y.o.   MRN: [id number] Patient Care Team    Relationship Specialty Notifications Start End  Catherine Charlies DELENA, DO PCP - General Family Medicine  02/20/19     Chief Complaint  Patient presents with   Hypertension   Subjective: Denise Rios is a 73 y.o.  female present for chronic condition management Lumbar back pain/arthralgia Patient reports compliance with diclofenac  twice daily.  She does not feel it takes away the pain on many days.  Patient reports her lower back is starting to hurt more frequently, and taking longer to respond to rest and medication. She underwent arthritic workup.  Currently seeing sports med and physical therapy.  Anxiety: Patient reports she started to have anxiety 2/2 to dwelling on dementia about 2020.   She has a strong family history of dementia on her mother side.  6 of 8 children on her mother side have been diagnosed with Alzheimer's.  Her mother died in her 14s with Alzheimer's.  She presented to her PCP with these concerns and she was started on Wellbutrin  to help her with her anxiety and focus.   Patient reports compliance with Wellbutrin  300 mg daily and feels it is working well for her   Hypertension/HLD/overweight: Pt reports compliance with amlodipine  5 mg daily, Crestor  20 mg daily and with Maxide.  Patient denies chest pain, shortness of breath, dizziness or lower extremity edema.   Syncope/vertigo:  No current episodes.  Prior note: Patient reports in May 2021 she had an event where she had a syncopal episode.  She reports she had no symptoms prior to passing out other than becoming very dizzy and then blacked out.  She states it was a an extremely hot day and they were looking for a new home in the area.  She felt extremely hot and tried to walk outside to get some air, and then was found passed out.  She states she was only passed out for less than 2 minutes.  She does think she hit  her head on the sidewalk on the way down.  She was taken to the emergency room with a negative work-up.  She states she did hit her head during that time.  Since then she has had some room spinning vertigo when looking towards the left and sitting forward.  She had cardiac echo and event monitoring for work-up for her her syncope without positive findings.   Event monitoring 01/10/2019 -Predominant normal sinus rhythm.  The heart rate ranged from 54 to 138 bpm and the average heart rate was 75 bpm. There was no atrial fibrillation. There was rare ectopic beats. There was one 5 beat episode of nonsustained ventricular tachycardia. No bradycardia arrhythmias and no pauses. Symptoms correlate with normal sinus rhythm without ectopy.  -Transthoracic echo 11/26/2018: Left ventricle: Cavity size is normal.  Wall thickness is normal.  Systolic function is normal with an estimated EF of 60-65%. Left atrium: Volume index is normal. Right atrium: Normal in size Right ventricle: Cavity size appears normal.  Systolic function is normal.      12/22/2022   10:52 AM 11/10/2022    9:36 AM 09/21/2022   11:38 AM 01/14/2022    9:45 AM 09/08/2021    1:09 PM  Depression screen PHQ 2/9  Decreased Interest 0 0 0 0 0  Down, Depressed, Hopeless 0 0 0 0 0  PHQ - 2 Score 0 0 0 0 0  Altered sleeping    2   Tired, decreased energy    0   Change in appetite    0   Feeling bad or failure about yourself     1   Trouble concentrating    0   Moving slowly or fidgety/restless    0   Suicidal thoughts    0   PHQ-9 Score    3       01/14/2022    9:45 AM 07/30/2021    1:10 PM 08/23/2019    2:41 PM 02/20/2019    1:13 PM  GAD 7 : Generalized Anxiety Score  Nervous, Anxious, on Edge 0 0 0 1  Control/stop worrying 0 0 0 1  Worry too much - different things 0 0 0 2  Trouble relaxing 1 0 0 0  Restless 0 0 0 0  Easily annoyed or irritable 0 0 0 0  Afraid - awful might happen 1 0 0 1  Total GAD 7 Score 2 0 0 5  Anxiety  Difficulty   Not difficult at all Not difficult at all           12/22/2022   10:52 AM 11/10/2022    9:36 AM 09/21/2022   11:40 AM 01/13/2022    9:03 PM 09/08/2021    1:11 PM  Fall Risk   Falls in the past year? 1 1 0 0 0  Number falls in past yr: 0 0 0  0  Injury with Fall? 0 0 0  0  Risk for fall due to :  No Fall Risks Impaired vision  Impaired vision  Follow up Falls evaluation completed Falls evaluation completed Falls prevention discussed  Falls prevention discussed     Immunization History  Administered Date(s) Administered   PFIZER(Purple Top)SARS-COV-2 Vaccination 06/21/2019, 07/11/2019, 04/15/2020   Zoster Recombinant(Shingrix) 03/26/2013, 12/28/2021    No results found.  Past Medical History:  Diagnosis Date   Anhedonia    Anxiety    Arteriosclerosis of carotid artery 03/19/2019   Less than 50%   Chicken pox    Hematuria 12/19/2017   History of fainting spells of unknown cause    Hyperlipidemia    Hypertension    Memory loss    prioor PCP records indicate neuro referral was made   Menopause    Osteoporosis    Rotator cuff (capsule) sprain    R>L   Syncope 09/2018   Vitamin D  deficiency    Allergies  Allergen Reactions   Lisinopril  Cough   Past Surgical History:  Procedure Laterality Date   CESAREAN SECTION  1980   ELECTROCARDIOGRAM  10/24/2018   SR. HR74, PR 167, QTC 406, NL-EKG   event monitor  01/10/2019   Predominant normal sinus rhythm.  HR 54-1 38.  Average HR 75.  No atrial fib.  Rare ectopic beats.  5 beat episode of nonsustained ventricular tachycardia x1.  No bradycardia arrhythmias.  No pauses.  Normal sinus rhythm without ectopy.   Image: CT chest  10/25/2018   normal- r/o PE   TRANSTHORACIC ECHOCARDIOGRAM  11/26/2018   EF 60-65%.  Normal study.  Completed presyncope.   US  CAROTID DOPPLER BILATERAL ([facility] HX)  10/24/2018   Arteriolosclerosis.  Findings consistent with less than 50% stenosis.  Bilateral vertebral blood flow demonstrated.    Family History  Problem Relation Age of Onset   Alzheimer's disease Mother        6: 8 children on her mother's side have Alzheimer's.  Bone cancer Father    Early death Brother    Early death Maternal Grandfather    Heart attack Maternal Grandfather    Social History   Social History Narrative   Marital status/children/pets: married, 2 children.    Education/employment: retired   Field Seismologist:      -smoke alarm in the home:Yes     - wears seatbelt: Yes     - Feels safe in their relationships: Yes    Allergies as of 06/08/2023       Reactions   Lisinopril  Cough        Medication List        Accurate as of June 08, 2023 10:58 AM. If you have any questions, ask your nurse or doctor.          alendronate  70 MG tablet Commonly known as: FOSAMAX  Take 1 tablet (70 mg total) by mouth every 7 (seven) days. Take with a full glass of water on an empty stomach. Must remain upright for 30 minutes   amLODipine  5 MG tablet Commonly known as: NORVASC  Take 1 tablet (5 mg total) by mouth daily.   buPROPion  300 MG 24 hr tablet Commonly known as: WELLBUTRIN  XL TAKE 1 TABLET(300 MG) BY MOUTH DAILY   diclofenac  75 MG EC tablet Commonly known as: VOLTAREN  Take 1 tablet (75 mg total) by mouth 2 (two) times daily.   fluticasone  50 MCG/ACT nasal spray Commonly known as: FLONASE  Place 2 sprays into both nostrils daily.   rosuvastatin  20 MG tablet Commonly known as: CRESTOR  TAKE ONE TABLET BY MOUTH ONE TIME DAILY AT BEDTIME   triamterene -hydrochlorothiazide 37.5-25 MG tablet Commonly known as: MAXZIDE-25 Take 1 tablet by mouth daily.        All past medical history, surgical history, allergies, family history, immunizations andmedications were updated in the EMR today and reviewed under the history and medication portions of their EMR.    No results found for this or any previous visit (from the past 2160 hours).   Patient was never admitted.   ROS: 14 pt review of  systems performed and negative (unless mentioned in an HPI)  Objective: BP 122/80   Pulse 74   Temp 97.7 F (36.5 C)   Wt 142 lb 3.2 oz (64.5 kg)   SpO2 98%   BMI 26.01 kg/m  Physical Exam Vitals and nursing note reviewed.  Constitutional:      General: She is not in acute distress.    Appearance: Normal appearance. She is not ill-appearing, toxic-appearing or diaphoretic.  HENT:     Head: Normocephalic and atraumatic.  Eyes:     General: No scleral icterus.       Right eye: No discharge.        Left eye: No discharge.     Extraocular Movements: Extraocular movements intact.     Conjunctiva/sclera: Conjunctivae normal.     Pupils: Pupils are equal, round, and reactive to light.  Cardiovascular:     Rate and Rhythm: Normal rate and regular rhythm.     Heart sounds: No murmur heard. Pulmonary:     Effort: Pulmonary effort is normal. No respiratory distress.     Breath sounds: Normal breath sounds. No wheezing, rhonchi or rales.  Musculoskeletal:     Right lower leg: No edema.     Left lower leg: No edema.  Skin:    General: Skin is warm.     Findings: Lesion (X 3 small flat macular tan splotches below left eye left  and right cheek) present. No rash.  Neurological:     Mental Status: She is alert and oriented to person, place, and time. Mental status is at baseline.     Motor: No weakness.     Gait: Gait normal.  Psychiatric:        Mood and Affect: Mood normal.        Behavior: Behavior normal.        Thought Content: Thought content normal.        Judgment: Judgment normal.     Assessment/plan: Denise Rios is a 73 y.o. female present for [facility] Essential hypertension/hyperlipidemia/overweight stable Continue Maxide 1 tab daily Continue amlodipine  5 mg qd Continue Crestor : Goal : < 135/85.  If above goal she will make an appointment to follow-up. -Low-sodium diet and exercise encouraged. Labs due next visit  Anxiousness: Stable Continue Wellbutrin  300 mg  QD  Lumbar pain/neck pain Stable Continue diclofenac  (VOLTAREN ) 75 MG EC tablet; Take 1 tablet (75 mg total) by mouth 2 (two) times daily.  Dispense: 60 tablet; Refill: 5 Working with SM and PT.   Osteoporosis, unspecified osteoporosis type, unspecified pathological fracture presence/Vitamin D  deficiency Dexa 01/04/2023: (-3.9) 2 yr - Vitamin D  (25 hydroxy) due next visit  Sun damage skin: Patient with 3 areas of flat darker skin splotches below left eye, left cheek and right cheek.  She reports has been present for years.  No family or personal history of skin cancer. She does have make-up on today, however lesions appear more consistent with sun damage/age spots.  We discussed the different types of skin cancers today between basal cell, squamous cell and melanoma and her typical presentations. Offered referral to dermatology if she would like a full skin cancer screening completed, and she declined today.  Return in about 24 weeks (around 11/23/2023) for Routine chronic condition follow-up.   Meds ordered this encounter  Medications   amLODipine  (NORVASC ) 5 MG tablet    Sig: Take 1 tablet (5 mg total) by mouth daily.    Dispense:  90 tablet    Refill:  1   buPROPion  (WELLBUTRIN  XL) 300 MG 24 hr tablet    Sig: TAKE 1 TABLET(300 MG) BY MOUTH DAILY    Dispense:  90 tablet    Refill:  1   diclofenac  (VOLTAREN ) 75 MG EC tablet    Sig: Take 1 tablet (75 mg total) by mouth 2 (two) times daily.    Dispense:  180 tablet    Refill:  1   triamterene -hydrochlorothiazide (MAXZIDE-25) 37.5-25 MG tablet    Sig: Take 1 tablet by mouth daily.    Dispense:  90 tablet    Refill:  1   rosuvastatin  (CRESTOR ) 20 MG tablet    Sig: TAKE ONE TABLET BY MOUTH ONE TIME DAILY AT BEDTIME    Dispense:  90 tablet    Refill:  1   No orders of the defined types were placed in this encounter.   Note is dictated utilizing voice recognition software. Although note has been proof read prior to signing,  occasional typographical errors still can be missed. If any questions arise, please do not hesitate to call for verification.  Electronically signed by: Charlies Bellini, DO [facility]

## 2023-09-27 ENCOUNTER — Ambulatory Visit: Payer: Medicare Other | Admitting: *Deleted

## 2023-09-27 ENCOUNTER — Ambulatory Visit (INDEPENDENT_AMBULATORY_CARE_PROVIDER_SITE_OTHER): Admitting: Family Medicine

## 2023-09-27 ENCOUNTER — Encounter: Payer: Self-pay | Admitting: Family Medicine

## 2023-09-27 VITALS — BP 122/76 | HR 67 | Temp 97.8°F | Wt 139.2 lb

## 2023-09-27 DIAGNOSIS — M542 Cervicalgia: Secondary | ICD-10-CM | POA: Diagnosis not present

## 2023-09-27 DIAGNOSIS — M47816 Spondylosis without myelopathy or radiculopathy, lumbar region: Secondary | ICD-10-CM | POA: Diagnosis not present

## 2023-09-27 DIAGNOSIS — M545 Low back pain, unspecified: Secondary | ICD-10-CM | POA: Diagnosis not present

## 2023-09-27 DIAGNOSIS — Z0000 Encounter for general adult medical examination without abnormal findings: Secondary | ICD-10-CM

## 2023-09-27 MED ORDER — TIZANIDINE HCL 4 MG PO TABS: 2.0000 mg | ORAL_TABLET | Freq: Two times a day (BID) | ORAL | 5 refills | Status: DC | PRN

## 2023-09-27 NOTE — Progress Notes (Signed)
 Denise Rios , 12-Apr-1951, 73 y.o., female MRN: [id number] Patient Care Team    Relationship Specialty Notifications Start End  Mariel Shope, DO PCP - General Family Medicine  02/20/19     Chief Complaint  Patient presents with   Back Pain    Feels like pain is moving to neck as well. Around 1 year but not sure     Subjective: Denise Rios is a 73 y.o. Pt presents for an OV with complaints of neck pain of greater than 1 year duration.  Associated symptoms include painful range of motion.  Patient reports pain will radiate from right sided neck down to bilateral shoulders.  Denies numbness and tingling upper extremities.  Denies dizziness. Pt has tried diclofenac  twice daily to ease their symptoms.   Patient reports ever since she had a syncopal episode about 5 years ago she has had discomfort in her neck.  Worsening over the last year.  Patient also has suffered from lower lumbar back pain for a couple years and prescribed diclofenac .  She reports she is still having frequent low back flares where she is unable to be active and has to lay on the couch.  She has worked with sports med in the past and underwent physical therapy, neither were helpful. Lumbar spine x-ray 11/10/2022 resulted with mild lower lumbar facet arthropathy.  Patient reports pain will wrap around her hips, but denies radiation of pain to buttocks or lower extremities.  No numbness and tingling of lower extremities.  Points to the location of L4-L5-S1     09/27/2023   11:36 AM 12/22/2022   10:52 AM 11/10/2022    9:36 AM 09/21/2022   11:38 AM 01/14/2022    9:45 AM  Depression screen PHQ 2/9  Decreased Interest 0 0 0 0 0  Down, Depressed, Hopeless 0 0 0 0 0  PHQ - 2 Score 0 0 0 0 0  Altered sleeping 1    2  Tired, decreased energy 0    0  Change in appetite 0    0  Feeling bad or failure about yourself  0    1  Trouble concentrating 0    0  Moving slowly or fidgety/restless 0    0  Suicidal  thoughts 0    0  PHQ-9 Score 1    3  Difficult doing work/chores Not difficult at all        Allergies  Allergen Reactions   Lisinopril  Cough   Social History   Social History Narrative   Marital status/children/pets: married, 2 children.    Education/employment: retired   Field seismologist:      -smoke alarm in the home:Yes     - wears seatbelt: Yes     - Feels safe in their relationships: Yes   Past Medical History:  Diagnosis Date   Anhedonia    Anxiety    Arteriosclerosis of carotid artery 03/19/2019   Less than 50%   Chicken pox    Hematuria 12/19/2017   History of fainting spells of unknown cause    Hyperlipidemia    Hypertension    Memory loss    prioor PCP records indicate neuro referral was made   Menopause    Osteoporosis    Rotator cuff (capsule) sprain    R>L   Syncope 09/2018   Vitamin D  deficiency    Past Surgical History:  Procedure Laterality Date   CESAREAN SECTION  1980  ELECTROCARDIOGRAM  10/24/2018   SR. HR74, PR 167, QTC 406, NL-EKG   event monitor  01/10/2019   Predominant normal sinus rhythm.  HR 54-1 38.  Average HR 75.  No atrial fib.  Rare ectopic beats.  5 beat episode of nonsustained ventricular tachycardia x1.  No bradycardia arrhythmias.  No pauses.  Normal sinus rhythm without ectopy.   Image: CT chest  10/25/2018   normal- r/o PE   TRANSTHORACIC ECHOCARDIOGRAM  11/26/2018   EF 60-65%.  Normal study.  Completed presyncope.   US  CAROTID DOPPLER BILATERAL ([facility] HX)  10/24/2018   Arteriolosclerosis.  Findings consistent with less than 50% stenosis.  Bilateral vertebral blood flow demonstrated.   Family History  Problem Relation Age of Onset   Alzheimer's disease Mother        6: 8 children on her mother's side have Alzheimer's.   Bone cancer Father    Early death Brother    Early death Maternal Grandfather    Heart attack Maternal Grandfather    Allergies as of 09/27/2023       Reactions   Lisinopril  Cough        Medication List         Accurate as of September 27, 2023 11:59 PM. If you have any questions, ask your nurse or doctor.          alendronate  70 MG tablet Commonly known as: FOSAMAX  Take 1 tablet (70 mg total) by mouth every 7 (seven) days. Take with a full glass of water on an empty stomach. Must remain upright for 30 minutes   amLODipine  5 MG tablet Commonly known as: NORVASC  Take 1 tablet (5 mg total) by mouth daily.   buPROPion  300 MG 24 hr tablet Commonly known as: WELLBUTRIN  XL TAKE 1 TABLET(300 MG) BY MOUTH DAILY   diclofenac  75 MG EC tablet Commonly known as: VOLTAREN  Take 1 tablet (75 mg total) by mouth 2 (two) times daily.   fluticasone  50 MCG/ACT nasal spray Commonly known as: FLONASE  Place 2 sprays into both nostrils daily.   rosuvastatin  20 MG tablet Commonly known as: CRESTOR  TAKE ONE TABLET BY MOUTH ONE TIME DAILY AT BEDTIME   tiZANidine  4 MG tablet Commonly known as: Zanaflex  Take 0.5-1 tablets (2-4 mg total) by mouth 2 (two) times daily as needed for muscle spasms. Started by: Karynn Deblasi   triamterene -hydrochlorothiazide 37.5-25 MG tablet Commonly known as: MAXZIDE-25 Take 1 tablet by mouth daily.        All past medical history, surgical history, allergies, family history, immunizations andmedications were updated in the EMR today and reviewed under the history and medication portions of their EMR.     ROS Negative, with the exception of above mentioned in HPI   Objective:  BP 122/76   Pulse 67   Temp 97.8 F (36.6 C)   Wt 139 lb 3.2 oz (63.1 kg)   SpO2 98%   BMI 25.46 kg/m  Body mass index is 25.46 kg/m.  Physical Exam Vitals and nursing note reviewed.  Constitutional:      General: She is not in acute distress.    Appearance: Normal appearance. She is normal weight. She is not ill-appearing or toxic-appearing.  HENT:     Head: Normocephalic and atraumatic.  Eyes:     General: No scleral icterus.       Right eye: No discharge.        Left  eye: No discharge.     Extraocular Movements: Extraocular movements intact.  Conjunctiva/sclera: Conjunctivae normal.     Pupils: Pupils are equal, round, and reactive to light.  Musculoskeletal:     Comments: Cervical spine: No bony tenderness, no step-off.  Ropiness right paracervical muscles and bilateral trapezius muscle group.  Full range of motion present.  Neurovascularly intact distally. Lumbar spine: No bony tenderness, no step-off.  No spasms present.  Full range of motion without discomfort.  Negative straight leg raises bilaterally.  Negative FABER bilaterally.  DTR equal bilaterally neurovascularly intact distally  Skin:    Findings: No rash.  Neurological:     Mental Status: She is alert and oriented to person, place, and time. Mental status is at baseline.     Motor: No weakness.     Coordination: Coordination normal.     Gait: Gait normal.  Psychiatric:        Mood and Affect: Mood normal.        Behavior: Behavior normal.        Thought Content: Thought content normal.        Judgment: Judgment normal.      No results found. No results found. No results found for this or any previous visit (from the past 24 hours).  Assessment/Plan: Denise Rios is a 73 y.o. female present for OV for  Neck pain: Cervical x-ray ordered Continue diclofenac  twice daily Trial of Zanaflex  3 times daily as needed Heat, massage, stretches can be helpful. Consider PT referral after x-ray results.  Lumbar facet arthropathy/lumbar pain Continue diclofenac  twice daily Trial of Zanaflex  3 times daily as needed Heat, massage, stretches can be helpful If the symptoms are worsening despite therapy, greater than 6 months, we will obtain an MRI of lower lumbar spine. Consider referral to physical therapy versus PMR after MRI results.   Reviewed expectations re: course of current medical issues. Discussed self-management of symptoms. Outlined signs and symptoms indicating need for  more acute intervention. Patient verbalized understanding and all questions were answered. Patient received an After-Visit Summary.    Orders Placed This Encounter  Procedures   DG Cervical Spine Complete   MR LUMBAR SPINE WO CONTRAST   Meds ordered this encounter  Medications   tiZANidine  (ZANAFLEX ) 4 MG tablet    Sig: Take 0.5-1 tablets (2-4 mg total) by mouth 2 (two) times daily as needed for muscle spasms.    Dispense:  60 tablet    Refill:  5   Referral Orders  No referral(s) requested today     Note is dictated utilizing voice recognition software. Although note has been proof read prior to signing, occasional typographical errors still can be missed. If any questions arise, please do not hesitate to call for verification.   electronically signed by:  Napolean Backbone, DO  [facility] [place]

## 2023-09-27 NOTE — Patient Instructions (Signed)

## 2023-09-27 NOTE — Patient Instructions (Signed)
 Denise Rios , Thank you for taking time to come for your Medicare Wellness Visit. I appreciate your ongoing commitment to your health goals. Please review the following plan we discussed and let me know if I can assist you in the future.   Screening recommendations/referrals: Colonoscopy: up to date Mammogram: up to date Bone Density: up to date Recommended yearly ophthalmology/optometry visit for glaucoma screening and checkup Recommended yearly dental visit for hygiene and checkup  Vaccinations: Influenza vaccine: up to date Pneumococcal vaccine: Education provided Tdap vaccine: Education provided Shingles vaccine: up to date      Preventive Care 65 Years and Older, Female Preventive care refers to lifestyle choices and visits with your health care provider that can promote health and wellness. What does preventive care include? A yearly physical exam. This is also called an annual well check. Dental exams once or twice a year. Routine eye exams. Ask your health care provider how often you should have your eyes checked. Personal lifestyle choices, including: Daily care of your teeth and gums. Regular physical activity. Eating a healthy diet. Avoiding tobacco and drug use. Limiting alcohol use. Practicing safe sex. Taking low-dose aspirin every day. Taking vitamin and mineral supplements as recommended by your health care provider. What happens during an annual well check? The services and screenings done by your health care provider during your annual well check will depend on your age, overall health, lifestyle risk factors, and family history of disease. Counseling  Your health care provider may ask you questions about your: Alcohol use. Tobacco use. Drug use. Emotional well-being. Home and relationship well-being. Sexual activity. Eating habits. History of falls. Memory and ability to understand (cognition). Work and work Astronomer. Reproductive health. Screening   You may have the following tests or measurements: Height, weight, and BMI. Blood pressure. Lipid and cholesterol levels. These may be checked every 5 years, or more frequently if you are over 28 years old. Skin check. Lung cancer screening. You may have this screening every year starting at age 25 if you have a 30-pack-year history of smoking and currently smoke or have quit within the past 15 years. Fecal occult blood test (FOBT) of the stool. You may have this test every year starting at age 102. Flexible sigmoidoscopy or colonoscopy. You may have a sigmoidoscopy every 5 years or a colonoscopy every 10 years starting at age 45. Hepatitis C blood test. Hepatitis B blood test. Sexually transmitted disease (STD) testing. Diabetes screening. This is done by checking your blood sugar (glucose) after you have not eaten for a while (fasting). You may have this done every 1-3 years. Bone density scan. This is done to screen for osteoporosis. You may have this done starting at age 72. Mammogram. This may be done every 1-2 years. Talk to your health care provider about how often you should have regular mammograms. Talk with your health care provider about your test results, treatment options, and if necessary, the need for more tests. Vaccines  Your health care provider may recommend certain vaccines, such as: Influenza vaccine. This is recommended every year. Tetanus, diphtheria, and acellular pertussis (Tdap, Td) vaccine. You may need a Td booster every 10 years. Zoster vaccine. You may need this after age 35. Pneumococcal 13-valent conjugate (PCV13) vaccine. One dose is recommended after age 79. Pneumococcal polysaccharide (PPSV23) vaccine. One dose is recommended after age 14. Talk to your health care provider about which screenings and vaccines you need and how often you need them. This information is  not intended to replace advice given to you by your health care provider. Make sure you discuss  any questions you have with your health care provider. Document Released: 06/12/2015 Document Revised: 02/03/2016 Document Reviewed: 03/17/2015 Elsevier Interactive Patient Education  2017 [employer].  Fall Prevention in the Home Falls can cause injuries. They can happen to people of all ages. There are many things you can do to make your home safe and to help prevent falls. What can I do on the outside of my home? Regularly fix the edges of walkways and driveways and fix any cracks. Remove anything that might make you trip as you walk through a door, such as a raised step or threshold. Trim any bushes or trees on the path to your home. Use bright outdoor lighting. Clear any walking paths of anything that might make someone trip, such as rocks or tools. Regularly check to see if handrails are loose or broken. Make sure that both sides of any steps have handrails. Any raised decks and porches should have guardrails on the edges. Have any leaves, snow, or ice cleared regularly. Use sand or salt on walking paths during winter. Clean up any spills in your garage right away. This includes oil or grease spills. What can I do in the bathroom? Use night lights. Install grab bars by the toilet and in the tub and shower. Do not use towel bars as grab bars. Use non-skid mats or decals in the tub or shower. If you need to sit down in the shower, use a plastic, non-slip stool. Keep the floor dry. Clean up any water that spills on the floor as soon as it happens. Remove soap buildup in the tub or shower regularly. Attach bath mats securely with double-sided non-slip rug tape. Do not have throw rugs and other things on the floor that can make you trip. What can I do in the bedroom? Use night lights. Make sure that you have a light by your bed that is easy to reach. Do not use any sheets or blankets that are too big for your bed. They should not hang down onto the floor. Have a firm chair that has  side arms. You can use this for support while you get dressed. Do not have throw rugs and other things on the floor that can make you trip. What can I do in the kitchen? Clean up any spills right away. Avoid walking on wet floors. Keep items that you use a lot in easy-to-reach places. If you need to reach something above you, use a strong step stool that has a grab bar. Keep electrical cords out of the way. Do not use floor polish or wax that makes floors slippery. If you must use wax, use non-skid floor wax. Do not have throw rugs and other things on the floor that can make you trip. What can I do with my stairs? Do not leave any items on the stairs. Make sure that there are handrails on both sides of the stairs and use them. Fix handrails that are broken or loose. Make sure that handrails are as long as the stairways. Check any carpeting to make sure that it is firmly attached to the stairs. Fix any carpet that is loose or worn. Avoid having throw rugs at the top or bottom of the stairs. If you do have throw rugs, attach them to the floor with carpet tape. Make sure that you have a light switch at the top of the  stairs and the bottom of the stairs. If you do not have them, ask someone to add them for you. What else can I do to help prevent falls? Wear shoes that: Do not have high heels. Have rubber bottoms. Are comfortable and fit you well. Are closed at the toe. Do not wear sandals. If you use a stepladder: Make sure that it is fully opened. Do not climb a closed stepladder. Make sure that both sides of the stepladder are locked into place. Ask someone to hold it for you, if possible. Clearly mark and make sure that you can see: Any grab bars or handrails. First and last steps. Where the edge of each step is. Use tools that help you move around (mobility aids) if they are needed. These include: Canes. Walkers. Scooters. Crutches. Turn on the lights when you go into a dark area.  Replace any light bulbs as soon as they burn out. Set up your furniture so you have a clear path. Avoid moving your furniture around. If any of your floors are uneven, fix them. If there are any pets around you, be aware of where they are. Review your medicines with your doctor. Some medicines can make you feel dizzy. This can increase your chance of falling. Ask your doctor what other things that you can do to help prevent falls. This information is not intended to replace advice given to you by your health care provider. Make sure you discuss any questions you have with your health care provider. Document Released: 03/12/2009 Document Revised: 10/22/2015 Document Reviewed: 06/20/2014 Elsevier Interactive Patient Education  2017 [employer].

## 2023-09-27 NOTE — Progress Notes (Signed)
 Subjective:   Denise Rios is a 73 y.o. female who presents for Medicare Annual (Subsequent) preventive examination.  Visit Complete: Virtual I connected with  [name] on 09/27/23 by a audio enabled telemedicine application and verified that I am speaking with the correct person using two identifiers.  Patient Location: Home  Provider Location: Home Office  I discussed the limitations of evaluation and management by telemedicine. The patient expressed understanding and agreed to proceed.  Vital Signs: Because this visit was a virtual/telehealth visit, some criteria may be missing or patient reported. Any vitals not documented were not able to be obtained and vitals that have been documented are patient reported.   Cardiac Risk Factors include: advanced age (>24men, >21 women);hypertension;obesity (BMI >30kg/m2)     Objective:    There were no vitals filed for this visit. There is no height or weight on file to calculate BMI.     09/27/2023   11:30 AM 11/24/2022   11:07 AM 09/21/2022   11:38 AM 09/08/2021    1:10 PM 09/02/2020    1:33 PM 04/13/2020    1:43 PM  Advanced Directives  Does Patient Have a Medical Advance Directive? No No No Yes No No  Type of Research officer, political party of Healthcare Power of Attorney in Chart?    No - copy requested    Would patient like information on creating a medical advance directive? No - Patient declined No - Patient declined No - Patient declined  Yes (MAU/Ambulatory/Procedural Areas - Information given) No - Patient declined    Current Medications (verified) Outpatient Encounter Medications as of 09/27/2023  Medication Sig   alendronate  (FOSAMAX ) 70 MG tablet Take 1 tablet (70 mg total) by mouth every 7 (seven) days. Take with a full glass of water on an empty stomach. Must remain upright for 30 minutes   amLODipine  (NORVASC ) 5 MG tablet Take 1 tablet (5 mg total) by mouth daily.   buPROPion   (WELLBUTRIN  XL) 300 MG 24 hr tablet TAKE 1 TABLET(300 MG) BY MOUTH DAILY   diclofenac  (VOLTAREN ) 75 MG EC tablet Take 1 tablet (75 mg total) by mouth 2 (two) times daily.   fluticasone  (FLONASE ) 50 MCG/ACT nasal spray Place 2 sprays into both nostrils daily.   rosuvastatin  (CRESTOR ) 20 MG tablet TAKE ONE TABLET BY MOUTH ONE TIME DAILY AT BEDTIME   tiZANidine (ZANAFLEX) 4 MG tablet Take 0.5-1 tablets (2-4 mg total) by mouth 2 (two) times daily as needed for muscle spasms.   triamterene -hydrochlorothiazide (MAXZIDE-25) 37.5-25 MG tablet Take 1 tablet by mouth daily.   No facility-administered encounter medications on file as of 09/27/2023.    Allergies (verified) Lisinopril    History: Past Medical History:  Diagnosis Date   Anhedonia    Anxiety    Arteriosclerosis of carotid artery 03/19/2019   Less than 50%   Chicken pox    Hematuria 12/19/2017   History of fainting spells of unknown cause    Hyperlipidemia    Hypertension    Memory loss    prioor PCP records indicate neuro referral was made   Menopause    Osteoporosis    Rotator cuff (capsule) sprain    R>L   Syncope 09/2018   Vitamin D  deficiency    Past Surgical History:  Procedure Laterality Date   CESAREAN SECTION  1980   ELECTROCARDIOGRAM  10/24/2018   SR. HR74, PR 167, QTC 406, NL-EKG   event monitor  01/10/2019   Predominant normal sinus rhythm.  HR 54-1 38.  Average HR 75.  No atrial fib.  Rare ectopic beats.  5 beat episode of nonsustained ventricular tachycardia x1.  No bradycardia arrhythmias.  No pauses.  Normal sinus rhythm without ectopy.   Image: CT chest  10/25/2018   normal- r/o PE   TRANSTHORACIC ECHOCARDIOGRAM  11/26/2018   EF 60-65%.  Normal study.  Completed presyncope.   US  CAROTID DOPPLER BILATERAL ([facility] HX)  10/24/2018   Arteriolosclerosis.  Findings consistent with less than 50% stenosis.  Bilateral vertebral blood flow demonstrated.   Family History  Problem Relation Age of Onset    Alzheimer's disease Mother        6: 8 children on her mother's side have Alzheimer's.   Bone cancer Father    Early death Brother    Early death Maternal Grandfather    Heart attack Maternal Grandfather    Social History   Socioeconomic History   Marital status: Married    Spouse name: Not on file   Number of children: Not on file   Years of education: Not on file   Highest education level: Associate degree: academic program  Occupational History   Occupation: retired  Tobacco Use   Smoking status: Never   Smokeless tobacco: Never  Vaping Use   Vaping status: Never Used  Substance and Sexual Activity   Alcohol use: Yes   Drug use: Never   Sexual activity: Not Currently    Partners: Male  Other Topics Concern   Not on file  Social History Narrative   Marital status/children/pets: married, 2 children.    Education/employment: retired   Field seismologist:      -smoke alarm in the home:Yes     - wears seatbelt: Yes     - Feels safe in their relationships: Yes   Social Drivers of Corporate investment banker Strain: Low Risk  (09/27/2023)   Overall Financial Resource Strain (CARDIA)    Difficulty of Paying Living Expenses: Not hard at all  Food Insecurity: No Food Insecurity (09/27/2023)   Hunger Vital Sign    Worried About Running Out of Food in the Last Year: Never true    Ran Out of Food in the Last Year: Never true  Transportation Needs: No Transportation Needs (09/27/2023)   PRAPARE - Administrator, Civil Service (Medical): No    Lack of Transportation (Non-Medical): No  Physical Activity: Inactive (09/27/2023)   Exercise Vital Sign    Days of Exercise per Week: 0 days    Minutes of Exercise per Session: 0 min  Stress: No Stress Concern Present (09/27/2023)   [employer] of Occupational Health - Occupational Stress Questionnaire    Feeling of Stress : Not at all  Social Connections: Moderately Isolated (09/27/2023)   Social Connection and Isolation Panel  [NHANES]    Frequency of Communication with Friends and Family: More than three times a week    Frequency of Social Gatherings with Friends and Family: Three times a week    Attends Religious Services: Never    Active Member of Clubs or Organizations: No    Attends Banker Meetings: Never    Marital Status: Married    Tobacco Counseling Counseling given: Not Answered   Clinical Intake:  Pre-visit preparation completed: Yes  Pain : No/denies pain     Diabetes: No  How often do you need to have someone help you when you read instructions, pamphlets, or  other written materials from your doctor or pharmacy?: 1 - Never  Interpreter Needed?: No  Information entered by :: Kieth Pelt  LPN   Activities of Daily Living    09/27/2023   11:33 AM  In your present state of health, do you have any difficulty performing the following activities:  Hearing? 0  Vision? 0  Difficulty concentrating or making decisions? 0  Walking or climbing stairs? 0  Dressing or bathing? 0  Doing errands, shopping? 0  Preparing Food and eating ? N  Using the Toilet? N  In the past six months, have you accidently leaked urine? N  Do you have problems with loss of bowel control? N  Managing your Medications? N  Managing your Finances? N  Housekeeping or managing your Housekeeping? N    Patient Care Team: Mariel Shope, DO as PCP - General (Family Medicine)  Indicate any recent Medical Services you may have received from other than [facility] providers in the past year (date may be approximate).     Assessment:   This is a routine wellness examination for [place].  Hearing/Vision screen Hearing Screening - Comments:: No trouble hearing Vision Screening - Comments:: Overdue [facility]    Goals Addressed             This Visit's Progress    Patient Stated   Not on track    Would like to lose about 5 pounds, drink more & eat healthier     Patient Stated   Not on track     None at this time     Patient Stated   Not on track    Lose weight      Patient Stated       Would like to maintain back pain not let get worse       Depression Screen    09/27/2023   11:36 AM 12/22/2022   10:52 AM 11/10/2022    9:36 AM 09/21/2022   11:38 AM 01/14/2022    9:45 AM 09/08/2021    1:09 PM 07/30/2021    1:10 PM  PHQ 2/9 Scores  PHQ - 2 Score 0 0 0 0 0 0 0  PHQ- 9 Score 1    3  4     Fall Risk    09/27/2023   11:32 AM 12/22/2022   10:52 AM 11/10/2022    9:36 AM 09/21/2022   11:40 AM 01/13/2022    9:03 PM  Fall Risk   Falls in the past year? 0 1 1 0 0  Number falls in past yr: 0 0 0 0   Injury with Fall? 0 0 0 0   Risk for fall due to :   No Fall Risks Impaired vision   Follow up Falls evaluation completed;Education provided;Falls prevention discussed Falls evaluation completed Falls evaluation completed Falls prevention discussed     MEDICARE RISK AT HOME: Medicare Risk at Home Any stairs in or around the home?: Yes If so, are there any without handrails?: No Home free of loose throw rugs in walkways, pet beds, electrical cords, etc?: Yes Adequate lighting in your home to reduce risk of falls?: Yes Life alert?: No Use of a cane, walker or w/c?: No Grab bars in the bathroom?: Yes Shower chair or bench in shower?: Yes Elevated toilet seat or a handicapped toilet?: No  TIMED UP AND GO:  Was the test performed?  No    Cognitive Function:        09/27/2023  11:33 AM 09/21/2022   11:41 AM 09/08/2021    1:13 PM  6CIT Screen  What Year? 0 points 0 points 0 points  What month? 0 points 0 points 0 points  What time? 0 points 0 points 0 points  Count back from 20 0 points 0 points 0 points  Months in reverse 0 points 0 points 2 points  Repeat phrase 0 points 0 points 0 points  Total Score 0 points 0 points 2 points    Immunizations Immunization History  Administered Date(s) Administered   PFIZER(Purple Top)SARS-COV-2 Vaccination 06/21/2019,  07/11/2019, 04/15/2020   Zoster Recombinant(Shingrix) 03/26/2013, 12/28/2021    TDAP status: Due, Education has been provided regarding the importance of this vaccine. Advised may receive this vaccine at local pharmacy or Health Dept. Aware to provide a copy of the vaccination record if obtained from local pharmacy or Health Dept. Verbalized acceptance and understanding.  Flu Vaccine status: Up to date  Pneumococcal vaccine status: Due, Education has been provided regarding the importance of this vaccine. Advised may receive this vaccine at local pharmacy or Health Dept. Aware to provide a copy of the vaccination record if obtained from local pharmacy or Health Dept. Verbalized acceptance and understanding.  Covid-19 vaccine status: Information provided on how to obtain vaccines.   Qualifies for Shingles Vaccine? No   Zostavax completed Yes   Shingrix Completed?: Yes  Screening Tests Health Maintenance  Topic Date Due   INFLUENZA VACCINE  12/29/2023   Colonoscopy  05/30/2024   Medicare Annual Wellness (AWV)  09/26/2024   MAMMOGRAM  01/03/2025   DEXA SCAN  04/09/2026   Zoster Vaccines- Shingrix  Completed   HPV VACCINES  Aged Out   Meningococcal B Vaccine  Aged Out   DTaP/Tdap/Td  Discontinued   Pneumonia Vaccine 50+ Years old  Discontinued   COVID-19 Vaccine  Discontinued   Hepatitis C Screening  Discontinued    Health Maintenance  There are no preventive care reminders to display for this patient.   Colorectal cancer screening: Type of screening: Colonoscopy. Completed 2016. Repeat every 10 years  Mammogram status: Completed  . Repeat every year  Bone Density status: Completed 2027. Results reflect: Bone density results: OSTEOPOROSIS. Repeat every 2 years.  Lung Cancer Screening: (Low Dose CT Chest recommended if Age 83-80 years, 20 pack-year currently smoking OR have quit w/in 15years.) does not qualify.   Lung Cancer Screening Referral:   Additional  Screening:  Hepatitis C Screening  never done  Vision Screening: Recommended annual ophthalmology exams for early detection of glaucoma and other disorders of the eye. Is the patient up to date with their annual eye exam?  Yes  Who is the provider or what is the name of the office in which the patient attends annual eye exams? [facility] If pt is not established with a provider, would they like to be referred to a provider to establish care? No .   Dental Screening: Recommended annual dental exams for proper oral hygiene    Community Resource Referral / Chronic Care Management: CRR required this visit?  No   CCM required this visit?  No     Plan:     I have personally reviewed and noted the following in the patient's chart:   Medical and social history Use of alcohol, tobacco or illicit drugs  Current medications and supplements including opioid prescriptions. Patient is not currently taking opioid prescriptions. Functional ability and status Nutritional status Physical activity Advanced directives List of other  physicians Hospitalizations, surgeries, and ER visits in previous 12 months Vitals Screenings to include cognitive, depression, and falls Referrals and appointments  In addition, I have reviewed and discussed with patient certain preventive protocols, quality metrics, and best practice recommendations. A written personalized care plan for preventive services as well as general preventive health recommendations were provided to patient.     Kieth Pelt, LPN   1/61/0960   After Visit Summary: (MyChart) Due to this being a telephonic visit, the after visit summary with patients personalized plan was offered to patient via MyChart   Nurse Notes:

## 2023-09-28 ENCOUNTER — Ambulatory Visit (HOSPITAL_BASED_OUTPATIENT_CLINIC_OR_DEPARTMENT_OTHER)
Admission: RE | Admit: 2023-09-28 | Discharge: 2023-09-28 | Disposition: A | Discharge status: Home / Self Care | Source: Ambulatory Visit | Attending: Family Medicine | Admitting: Family Medicine

## 2023-09-28 DIAGNOSIS — M542 Cervicalgia: Secondary | ICD-10-CM | POA: Diagnosis present

## 2023-10-01 ENCOUNTER — Ambulatory Visit (HOSPITAL_BASED_OUTPATIENT_CLINIC_OR_DEPARTMENT_OTHER)
Admission: RE | Admit: 2023-10-01 | Discharge: 2023-10-01 | Disposition: A | Discharge status: Home / Self Care | Source: Ambulatory Visit | Attending: Family Medicine | Admitting: Family Medicine

## 2023-10-01 DIAGNOSIS — M47816 Spondylosis without myelopathy or radiculopathy, lumbar region: Secondary | ICD-10-CM | POA: Insufficient documentation

## 2023-10-01 DIAGNOSIS — M542 Cervicalgia: Secondary | ICD-10-CM | POA: Diagnosis present

## 2023-10-01 DIAGNOSIS — M545 Low back pain, unspecified: Secondary | ICD-10-CM | POA: Insufficient documentation

## 2023-10-12 ENCOUNTER — Ambulatory Visit: Payer: Self-pay | Admitting: Family Medicine

## 2023-10-12 DIAGNOSIS — M47816 Spondylosis without myelopathy or radiculopathy, lumbar region: Secondary | ICD-10-CM

## 2023-10-12 DIAGNOSIS — M545 Low back pain, unspecified: Secondary | ICD-10-CM

## 2023-10-12 DIAGNOSIS — M51369 Other intervertebral disc degeneration, lumbar region without mention of lumbar back pain or lower extremity pain: Secondary | ICD-10-CM

## 2023-10-30 NOTE — Telephone Encounter (Signed)
 Please call patient: Her MRI of the lumbar spine confirms mild arthritis changes where the nerves exit from her spinal column at the level of L3-S1.  These changes are described as mild.  She also has a mild disc bulge at the L3-L4 and L4-L5 level.  This correlates with her symptoms.  I have placed a referral to [facility]Dr[facility].  They should be calling her to schedule an appointment.  She may be a candidate for epidural injections to help with her discomfort.

## 2023-11-06 ENCOUNTER — Other Ambulatory Visit: Payer: Self-pay | Admitting: Family Medicine

## 2023-11-23 ENCOUNTER — Encounter: Payer: Self-pay | Admitting: Family Medicine

## 2023-11-23 ENCOUNTER — Ambulatory Visit: Payer: Medicare Other | Admitting: Family Medicine

## 2023-11-23 VITALS — BP 126/82 | HR 76 | Temp 97.8°F | Wt 137.6 lb

## 2023-11-23 DIAGNOSIS — F419 Anxiety disorder, unspecified: Secondary | ICD-10-CM | POA: Diagnosis not present

## 2023-11-23 DIAGNOSIS — I10 Essential (primary) hypertension: Secondary | ICD-10-CM

## 2023-11-23 DIAGNOSIS — M542 Cervicalgia: Secondary | ICD-10-CM

## 2023-11-23 DIAGNOSIS — M810 Age-related osteoporosis without current pathological fracture: Secondary | ICD-10-CM

## 2023-11-23 DIAGNOSIS — M545 Low back pain, unspecified: Secondary | ICD-10-CM

## 2023-11-23 DIAGNOSIS — E782 Mixed hyperlipidemia: Secondary | ICD-10-CM

## 2023-11-23 DIAGNOSIS — E559 Vitamin D deficiency, unspecified: Secondary | ICD-10-CM

## 2023-11-23 DIAGNOSIS — Z1211 Encounter for screening for malignant neoplasm of colon: Secondary | ICD-10-CM

## 2023-11-23 DIAGNOSIS — R7309 Other abnormal glucose: Secondary | ICD-10-CM | POA: Diagnosis not present

## 2023-11-23 DIAGNOSIS — R413 Other amnesia: Secondary | ICD-10-CM

## 2023-11-23 LAB — COMPREHENSIVE METABOLIC PANEL WITH GFR
ALT: 11 U/L (ref 0–35)
AST: 15 U/L (ref 0–37)
Albumin: 4.5 g/dL (ref 3.5–5.2)
Alkaline Phosphatase: 38 U/L — ABNORMAL LOW (ref 39–117)
BUN: 14 mg/dL (ref 6–23)
CO2: 28 meq/L (ref 19–32)
Calcium: 9.4 mg/dL (ref 8.4–10.5)
Chloride: 103 meq/L (ref 96–112)
Creatinine, Ser: 0.96 mg/dL (ref 0.40–1.20)
GFR: 59.66 mL/min — ABNORMAL LOW (ref >60.00)
Glucose, Bld: 95 mg/dL (ref 70–99)
Potassium: 4.6 meq/L (ref 3.5–5.1)
Sodium: 138 meq/L (ref 135–145)
Total Bilirubin: 0.3 mg/dL (ref 0.2–1.2)
Total Protein: 6.6 g/dL (ref 6.0–8.3)

## 2023-11-23 LAB — CBC
HCT: 40 % (ref 36.0–46.0)
Hemoglobin: 13.2 g/dL (ref 12.0–15.0)
MCHC: 33 g/dL (ref 30.0–36.0)
MCV: 87.4 fl (ref 78.0–100.0)
Platelets: 249 10*3/uL (ref 150.0–400.0)
RBC: 4.57 Mil/uL (ref 3.87–5.11)
RDW: 14.8 % (ref 11.5–15.5)
WBC: 4.9 10*3/uL (ref 4.0–10.5)

## 2023-11-23 LAB — LIPID PANEL
Cholesterol: 191 mg/dL (ref 0–200)
HDL: 88.4 mg/dL (ref >39.00)
LDL Cholesterol: 91 mg/dL (ref 0–99)
NonHDL: 102.9
Total CHOL/HDL Ratio: 2
Triglycerides: 59 mg/dL (ref 0.0–149.0)
VLDL: 11.8 mg/dL (ref 0.0–40.0)

## 2023-11-23 LAB — TSH: TSH: 1.27 u[IU]/mL (ref 0.35–5.50)

## 2023-11-23 LAB — VITAMIN D 25 HYDROXY (VIT D DEFICIENCY, FRACTURES): VITD: 29.54 ng/mL — ABNORMAL LOW (ref 30.00–100.00)

## 2023-11-23 LAB — T4, FREE: Free T4: 1.06 ng/dL (ref 0.60–1.60)

## 2023-11-23 LAB — B12 AND FOLATE PANEL
Folate: 18.7 ng/mL (ref >5.9)
Vitamin B-12: 267 pg/mL (ref 211–911)

## 2023-11-23 LAB — HEMOGLOBIN A1C: Hgb A1c MFr Bld: 5.5 % (ref 4.6–6.5)

## 2023-11-23 MED ORDER — TRIAMTERENE-HCTZ 37.5-25 MG PO TABS: 1.0000 | ORAL_TABLET | Freq: Every day | ORAL | 1 refills | Status: DC

## 2023-11-23 MED ORDER — ROSUVASTATIN CALCIUM 20 MG PO TABS: ORAL_TABLET | ORAL | 3 refills | Status: DC

## 2023-11-23 MED ORDER — DICLOFENAC SODIUM 75 MG PO TBEC: 75.0000 mg | DELAYED_RELEASE_TABLET | Freq: Two times a day (BID) | ORAL | 1 refills | Status: DC

## 2023-11-23 MED ORDER — AMLODIPINE BESYLATE 5 MG PO TABS: 5.0000 mg | ORAL_TABLET | Freq: Every day | ORAL | 1 refills | Status: DC

## 2023-11-23 MED ORDER — BUPROPION HCL ER (XL) 300 MG PO TB24: ORAL_TABLET | ORAL | 1 refills | Status: DC

## 2023-11-23 MED ORDER — ALENDRONATE SODIUM 70 MG PO TABS: 70.0000 mg | ORAL_TABLET | ORAL | 3 refills | Status: DC

## 2023-11-23 NOTE — Patient Instructions (Signed)
 Return in about 24 weeks (around 05/09/2024) for Routine chronic condition follow-up.        Great to see you today.  I have refilled the medication(s) we provide.   If labs were collected or images ordered, we will inform you of  results once we have received them and reviewed. We will contact you either by echart message, or telephone call.  Please give ample time to the testing facility, and our office to run,  receive and review results. Please do not call inquiring of results, even if you can see them in your chart. We will contact you as soon as we are able. If it has been over 1 week since the test was completed, and you have not yet heard from us , then please call us .    - echart message- for normal results that have been seen by the patient already.   - telephone call: abnormal results or if patient has not viewed results in their echart.  If a referral to a specialist was entered for you, please call us  in 2 weeks if you have not heard from the specialist office to schedule.

## 2023-11-23 NOTE — Progress Notes (Signed)
 Patient ID: Denise Rios, female  DOB: [birth year], 73 y.o.   MRN: [id number] Patient Care Team    Relationship Specialty Notifications Start End  Catherine Charlies DELENA, DO PCP - General Family Medicine  02/20/19   Leni Marjory MATSU, MD  Rheumatology  11/23/23   Joane Artist RAMAN, MD Consulting Physician Sports Medicine  11/23/23   Kathrene Odella Jansky, PA-C  Anesthesiology  11/23/23     Chief Complaint  Patient presents with   Hypertension   Subjective: Denise Rios is a 73 y.o.  female present for chronic condition management Neck pain: She has started PT and she is taking diclofenac .  She has seen rheumatology which she states did not have much to offer. Prior note: complaints of neck pain of greater than 1 year duration.  Associated symptoms include painful range of motion.  Patient reports pain will radiate from right sided neck down to bilateral shoulders.  Denies numbness and tingling upper extremities.  Denies dizziness. Pt has tried diclofenac  twice daily to ease their symptoms.  Patient reports ever since she had a syncopal episode about 5 years ago she has had discomfort in her neck.  Worsening over the last year.  Lumbar back pain/arthralgia She is taking diclofenac  and starting PT. she is established with rheumatology who did not have much to offer. Prior note: Patient also has suffered from lower lumbar back pain for a couple years and prescribed diclofenac .  She reports she is still having frequent low back flares where she is unable to be active and has to lay on the couch.  She has worked with sports med in the past and underwent physical therapy, neither were helpful. Lumbar spine x-ray 11/10/2022 resulted with mild lower lumbar facet arthropathy. Patient reports pain will wrap around her hips, but denies radiation of pain to buttocks or lower extremities.  No numbness and tingling of lower extremities.  Points to the location of L4-L5-S1 Patient reports compliance with  diclofenac  twice daily.  She does not feel it takes away the pain on many days.  Patient reports her lower back is starting to hurt more frequently, and taking longer to respond to rest and medication. She underwent arthritic workup.  Currently seeing sports med and physical therapy. Her MRI of the lumbar spine confirms mild arthritis changes where the nerves exit from her spinal column at the level of L3-S1.  These changes are described as mild.  She also has a mild disc bulge at the L3-L4 and L4-L5 level.  This correlated with her symptoms. referral to PMR-Dr. Ramo's placed 10/12/2023  Anxiety: Patient reports she started to have anxiety 2/2 to dwelling on dementia about 2020.   She has a strong family history of dementia on her mother side.  6 of 8 children on her mother side have been diagnosed with Alzheimer's.  Her mother died in her 93s with Alzheimer's.  She presented to her PCP with these concerns and she was started on Wellbutrin  to help her with her anxiety and focus.   Patient reports compliance with Wellbutrin  300 mg daily and feels it is working well for her. She does admit today that she feels like her memory has been declining intermittently recently.  She gives 2 examples of repeating story to the same person 12 hours later and not recalling she had told her the story.  She also gives an example of she and her family playing cards, it was a card game she knew well  and had been playing for many years, but she made mistakes and confused the rules etc.  Hypertension/HLD/overweight: Pt reports compliance with amlodipine  5 mg daily, Crestor  20 mg daily and with Maxide.  Patient denies chest pain, shortness of breath, dizziness or lower extremity edema.   Syncope/vertigo:  No current episodes.  Prior note: Patient reports in May 2021 she had an event where she had a syncopal episode.  She reports she had no symptoms prior to passing out other than becoming very dizzy and then blacked out.   She states it was a an extremely hot day and they were looking for a new home in the area.  She felt extremely hot and tried to walk outside to get some air, and then was found passed out.  She states she was only passed out for less than 2 minutes.  She does think she hit her head on the sidewalk on the way down.  She was taken to the emergency room with a negative work-up.  She states she did hit her head during that time.  Since then she has had some room spinning vertigo when looking towards the left and sitting forward.  She had cardiac echo and event monitoring for work-up for her her syncope without positive findings.   Event monitoring 01/10/2019 -Predominant normal sinus rhythm.  The heart rate ranged from 54 to 138 bpm and the average heart rate was 75 bpm. There was no atrial fibrillation. There was rare ectopic beats. There was one 5 beat episode of nonsustained ventricular tachycardia. No bradycardia arrhythmias and no pauses. Symptoms correlate with normal sinus rhythm without ectopy.  -Transthoracic echo 11/26/2018: Left ventricle: Cavity size is normal.  Wall thickness is normal.  Systolic function is normal with an estimated EF of 60-65%. Left atrium: Volume index is normal. Right atrium: Normal in size Right ventricle: Cavity size appears normal.  Systolic function is normal.      11/23/2023   10:26 AM 09/27/2023   11:36 AM 12/22/2022   10:52 AM 11/10/2022    9:36 AM 09/21/2022   11:38 AM  Depression screen PHQ 2/9  Decreased Interest 0 0 0 0 0  Down, Depressed, Hopeless 0 0 0 0 0  PHQ - 2 Score 0 0 0 0 0  Altered sleeping 1 1     Tired, decreased energy 0 0     Change in appetite 1 0     Feeling bad or failure about yourself  0 0     Trouble concentrating 1 0     Moving slowly or fidgety/restless 0 0     Suicidal thoughts 0 0     PHQ-9 Score 3 1     Difficult doing work/chores Not difficult at all Not difficult at all         11/23/2023   10:27 AM 01/14/2022     9:45 AM 07/30/2021    1:10 PM 08/23/2019    2:41 PM  GAD 7 : Generalized Anxiety Score  Nervous, Anxious, on Edge 0 0 0 0  Control/stop worrying 0 0 0 0  Worry too much - different things 0 0 0 0  Trouble relaxing 1 1 0 0  Restless 1 0 0 0  Easily annoyed or irritable 0 0 0 0  Afraid - awful might happen 0 1 0 0  Total GAD 7 Score 2 2 0 0  Anxiety Difficulty Not difficult at all   Not difficult at all  11/23/2023   10:26 AM 09/27/2023   11:32 AM 12/22/2022   10:52 AM 11/10/2022    9:36 AM 09/21/2022   11:40 AM  Fall Risk   Falls in the past year? 0 0 1 1 0  Number falls in past yr:  0 0 0 0  Injury with Fall?  0 0 0 0  Risk for fall due to :    No Fall Risks Impaired vision  Follow up Falls evaluation completed Falls evaluation completed;Education provided;Falls prevention discussed Falls evaluation completed Falls evaluation completed Falls prevention discussed     Immunization History  Administered Date(s) Administered   PFIZER(Purple Top)SARS-COV-2 Vaccination 06/21/2019, 07/11/2019, 04/15/2020   Zoster Recombinant(Shingrix) 03/26/2013, 12/28/2021    No results found.  Past Medical History:  Diagnosis Date   Anhedonia    Anxiety    Arteriosclerosis of carotid artery 03/19/2019   Less than 50%   Chicken pox    Hematuria 12/19/2017   History of fainting spells of unknown cause    Hyperlipidemia    Hypertension    Memory loss    prioor PCP records indicate neuro referral was made   Menopause    Osteoporosis    Rotator cuff (capsule) sprain    R>L   Syncope 09/2018   Vitamin D  deficiency    Allergies  Allergen Reactions   Lisinopril  Cough   Past Surgical History:  Procedure Laterality Date   CESAREAN SECTION  1980   ELECTROCARDIOGRAM  10/24/2018   SR. HR74, PR 167, QTC 406, NL-EKG   event monitor  01/10/2019   Predominant normal sinus rhythm.  HR 54-1 38.  Average HR 75.  No atrial fib.  Rare ectopic beats.  5 beat episode of nonsustained  ventricular tachycardia x1.  No bradycardia arrhythmias.  No pauses.  Normal sinus rhythm without ectopy.   Image: CT chest  10/25/2018   normal- r/o PE   TRANSTHORACIC ECHOCARDIOGRAM  11/26/2018   EF 60-65%.  Normal study.  Completed presyncope.   US  CAROTID DOPPLER BILATERAL ([facility] HX)  10/24/2018   Arteriolosclerosis.  Findings consistent with less than 50% stenosis.  Bilateral vertebral blood flow demonstrated.   Family History  Problem Relation Age of Onset   Alzheimer's disease Mother        6: 8 children on her mother's side have Alzheimer's.   Bone cancer Father    Early death Brother    Early death Maternal Grandfather    Heart attack Maternal Grandfather    Social History   Social History Narrative   Marital status/children/pets: married, 2 children.    Education/employment: retired   Field seismologist:      -smoke alarm in the home:Yes     - wears seatbelt: Yes     - Feels safe in their relationships: Yes    Allergies as of 11/23/2023       Reactions   Lisinopril  Cough        Medication List        Accurate as of November 23, 2023  1:10 PM. If you have any questions, ask your nurse or doctor.          alendronate  70 MG tablet Commonly known as: FOSAMAX  Take 1 tablet (70 mg total) by mouth every 7 (seven) days. Take with a full glass of water on an empty stomach. Must remain upright for 30 minutes   amLODipine  5 MG tablet Commonly known as: NORVASC  Take 1 tablet (5 mg total) by mouth daily.   buPROPion   300 MG 24 hr tablet Commonly known as: WELLBUTRIN  XL TAKE 1 TABLET(300 MG) BY MOUTH DAILY   diclofenac  75 MG EC tablet Commonly known as: VOLTAREN  Take 1 tablet (75 mg total) by mouth 2 (two) times daily.   fluticasone  50 MCG/ACT nasal spray Commonly known as: FLONASE  Place 2 sprays into both nostrils daily.   rosuvastatin  20 MG tablet Commonly known as: CRESTOR  TAKE ONE TABLET BY MOUTH ONE TIME DAILY AT BEDTIME   tiZANidine  4 MG tablet Commonly known as:  Zanaflex  Take 0.5-1 tablets (2-4 mg total) by mouth 2 (two) times daily as needed for muscle spasms.   triamterene -hydrochlorothiazide 37.5-25 MG tablet Commonly known as: MAXZIDE-25 Take 1 tablet by mouth daily.        All past medical history, surgical history, allergies, family history, immunizations andmedications were updated in the EMR today and reviewed under the history and medication portions of their EMR.    No results found for this or any previous visit (from the past 2160 hours).   Patient was never admitted.   ROS: 14 pt review of systems performed and negative (unless mentioned in an HPI)  Objective: BP 126/82   Pulse 76   Temp 97.8 F (36.6 C)   Wt 137 lb 9.6 oz (62.4 kg)   SpO2 98%   BMI 25.17 kg/m  Physical Exam Vitals and nursing note reviewed.  Constitutional:      General: She is not in acute distress.    Appearance: Normal appearance. She is not ill-appearing, toxic-appearing or diaphoretic.  HENT:     Head: Normocephalic and atraumatic.   Eyes:     General: No scleral icterus.       Right eye: No discharge.        Left eye: No discharge.     Extraocular Movements: Extraocular movements intact.     Conjunctiva/sclera: Conjunctivae normal.     Pupils: Pupils are equal, round, and reactive to light.    Cardiovascular:     Rate and Rhythm: Normal rate and regular rhythm.     Heart sounds: No murmur heard. Pulmonary:     Effort: Pulmonary effort is normal. No respiratory distress.     Breath sounds: Normal breath sounds. No wheezing, rhonchi or rales.   Musculoskeletal:     Cervical back: Neck supple.     Right lower leg: No edema.     Left lower leg: No edema.   Skin:    General: Skin is warm.     Findings: No rash.   Neurological:     Mental Status: She is alert and oriented to person, place, and time. Mental status is at baseline.     Motor: No weakness.     Gait: Gait normal.   Psychiatric:        Mood and Affect: Mood normal.         Behavior: Behavior normal.        Thought Content: Thought content normal.        Judgment: Judgment normal.     Assessment/plan: Denise Rios is a 73 y.o. female present for Chronic Conditions/illness Management Essential hypertension/hyperlipidemia/overweight Stable Continue Maxide 1 tab daily Continue amlodipine  5 mg qd Continue Crestor : Goal : < 135/85.  If above goal she will make an appointment to follow-up. -Low-sodium diet and exercise encouraged. CBC, CMP, TSH and lipids collected today  Anxiousness: Stable Continue Wellbutrin  300 mg QD  Osteoporosis, unspecified osteoporosis type, unspecified pathological fracture presence/Vitamin D  deficiency Dexa 01/04/2023: (-  3.9) 2 yr - Vitamin D  (25 hydroxy) collected today Neck pain: Cervical x-ray ordered Continue diclofenac  twice daily Trial of Zanaflex  3 times daily as needed Heat, massage, stretches can be helpful. Consider PT referral after x-ray results. Memory decline: B12, vitamin D , thyroid  panel collected today Strong family history of Alzheimer's We discussed APOE risk labs and she declined today We discussed considering obtaining brain MRI in the future if labs do not indicate cause.  For now she wants to start with thyroid  and vitamin labs Lumbar facet arthropathy/lumbar pain/neck pain Continue diclofenac  twice daily Trial of Zanaflex  3 times daily as needed-rarely needing, does not need refills today Heat, massage, stretches can be helpful Continue physical therapy. Rheumatology did not have much more to offer  Return in about 24 weeks (around 05/09/2024) for Routine chronic condition follow-up.   Meds ordered this encounter  Medications   alendronate  (FOSAMAX ) 70 MG tablet    Sig: Take 1 tablet (70 mg total) by mouth every 7 (seven) days. Take with a full glass of water on an empty stomach. Must remain upright for 30 minutes    Dispense:  12 tablet    Refill:  3    triamterene -hydrochlorothiazide (MAXZIDE-25) 37.5-25 MG tablet    Sig: Take 1 tablet by mouth daily.    Dispense:  90 tablet    Refill:  1   rosuvastatin  (CRESTOR ) 20 MG tablet    Sig: TAKE ONE TABLET BY MOUTH ONE TIME DAILY AT BEDTIME    Dispense:  90 tablet    Refill:  3   diclofenac  (VOLTAREN ) 75 MG EC tablet    Sig: Take 1 tablet (75 mg total) by mouth 2 (two) times daily.    Dispense:  180 tablet    Refill:  1   buPROPion  (WELLBUTRIN  XL) 300 MG 24 hr tablet    Sig: TAKE 1 TABLET(300 MG) BY MOUTH DAILY    Dispense:  90 tablet    Refill:  1   amLODipine  (NORVASC ) 5 MG tablet    Sig: Take 1 tablet (5 mg total) by mouth daily.    Dispense:  90 tablet    Refill:  1   Orders Placed This Encounter  Procedures   CBC   Comp Met (CMET)   TSH   Lipid panel   Hemoglobin A1c   Vitamin D  (25 hydroxy)   B12 and Folate Panel   T4, free   Thyroid  peroxidase antibody   Ambulatory referral to Gastroenterology    Note is dictated utilizing voice recognition software. Although note has been proof read prior to signing, occasional typographical errors still can be missed. If any questions arise, please do not hesitate to call for verification.  Electronically signed by: Charlies Bellini, DO [facility]

## 2023-11-24 ENCOUNTER — Ambulatory Visit: Payer: Self-pay | Admitting: Family Medicine

## 2023-11-24 LAB — THYROID PEROXIDASE ANTIBODY: Thyroperoxidase Ab SerPl-aCnc: <1 [IU]/mL (ref <9)

## 2024-01-26 ENCOUNTER — Encounter: Payer: Self-pay | Admitting: Family Medicine

## 2024-02-04 ENCOUNTER — Other Ambulatory Visit: Payer: Self-pay | Admitting: Family Medicine

## 2024-02-08 ENCOUNTER — Encounter: Payer: Self-pay | Admitting: Gastroenterology

## 2024-03-12 NOTE — Telephone Encounter (Signed)
 No further action needed at this time.

## 2024-03-19 ENCOUNTER — Ambulatory Visit: Admitting: Gastroenterology

## 2024-03-22 ENCOUNTER — Other Ambulatory Visit: Payer: Self-pay | Admitting: Family Medicine

## 2024-03-22 DIAGNOSIS — Z1231 Encounter for screening mammogram for malignant neoplasm of breast: Secondary | ICD-10-CM

## 2024-03-25 ENCOUNTER — Ambulatory Visit
Admission: RE | Admit: 2024-03-25 | Discharge: 2024-03-25 | Disposition: A | Discharge status: Home / Self Care | Source: Ambulatory Visit | Attending: Family Medicine | Admitting: Family Medicine

## 2024-03-25 DIAGNOSIS — Z1231 Encounter for screening mammogram for malignant neoplasm of breast: Secondary | ICD-10-CM

## 2024-03-28 ENCOUNTER — Ambulatory Visit: Payer: Self-pay | Admitting: Family Medicine

## 2024-04-15 ENCOUNTER — Other Ambulatory Visit: Payer: Self-pay | Admitting: Family Medicine

## 2024-04-30 ENCOUNTER — Ambulatory Visit: Admitting: Gastroenterology

## 2024-04-30 ENCOUNTER — Encounter: Payer: Self-pay | Admitting: Gastroenterology

## 2024-04-30 VITALS — BP 148/84 | HR 78 | Ht 62.0 in | Wt 142.4 lb

## 2024-04-30 DIAGNOSIS — Z1211 Encounter for screening for malignant neoplasm of colon: Secondary | ICD-10-CM

## 2024-04-30 DIAGNOSIS — Z01818 Encounter for other preprocedural examination: Secondary | ICD-10-CM

## 2024-04-30 NOTE — Patient Instructions (Signed)
 You will be due for a recall colonoscopy in 12/2024. We will send you a reminder in the mail when it gets closer to that time.  _______________________________________________________  If your blood pressure at your visit was 140/90 or greater, please contact your primary care physician to follow up on this.  _______________________________________________________  If you are age 71 or older, your body mass index should be between 23-30. Your Body mass index is 26.05 kg/m. If this is out of the aforementioned range listed, please consider follow up with your Primary Care Provider.  If you are age 37 or younger, your body mass index should be between 19-25. Your Body mass index is 26.05 kg/m. If this is out of the aformentioned range listed, please consider follow up with your Primary Care Provider.   ________________________________________________________  The [facility] providers would like to encourage you to use MYCHART to communicate with providers for non-urgent requests or questions.  Due to long hold times on the telephone, sending your provider a message by [facility] may be a faster and more efficient way to get a response.  Please allow 48 business hours for a response.  Please remember that this is for non-urgent requests.  _______________________________________________________  [facility] is using a team-based approach to care.  Your team is made up of your doctor and two to three APPS. Our APPS (Nurse Practitioners and Physician Assistants) work with your physician to ensure care continuity for you. They are fully qualified to address your health concerns and develop a treatment plan. They communicate directly with your gastroenterologist to care for you. Seeing the Advanced Practice Practitioners on your physician's team can help you by facilitating care more promptly, often allowing for earlier appointments, access to diagnostic testing, procedures, and other specialty  referrals.

## 2024-04-30 NOTE — Progress Notes (Addendum)
 Discussed the use of AI scribe software for clinical note transcription with the patient, who gave verbal consent to proceed.  HPI : Denise Rios is a 73 year old female who presents for colon cancer screening.  She is uncertain of the exact date of her last colonoscopy, estimating it was approximately eight years ago. She has undergone at least two in the past, all reported as normal by the patient with no polyps detected.  She has no family history of colon cancer and reports no gastrointestinal symptoms such as changes in bowel habits, blood in stool, or unexplained weight loss. Her bowel movements are regular, with no constipation or diarrhea.  She moved from [place]  to [facility] five years ago and has not experienced any significant health issues since the move. She has no history of heart disease, stroke, or respiratory conditions such as asthma or COPD. No upper gastrointestinal symptoms like heartburn, acid reflux, or difficulty swallowing.  She has not had any abdominal surgeries such as hysterectomy, appendectomy, or cholecystectomy.     Past Medical History:  Diagnosis Date   Anhedonia    Anxiety    Arteriosclerosis of carotid artery 03/19/2019   Less than 50%   Chicken pox    Hematuria 12/19/2017   History of fainting spells of unknown cause    Hyperlipidemia    Hypertension    Memory loss    prioor PCP records indicate neuro referral was made   Menopause    Osteoporosis    Rotator cuff (capsule) sprain    R>L   Syncope 09/2018   Vitamin D  deficiency      Past Surgical History:  Procedure Laterality Date   CESAREAN SECTION  1980   ELECTROCARDIOGRAM  10/24/2018   SR. HR74, PR 167, QTC 406, NL-EKG   event monitor  01/10/2019   Predominant normal sinus rhythm.  HR 54-1 38.  Average HR 75.  No atrial fib.  Rare ectopic beats.  5 beat episode of nonsustained ventricular tachycardia x1.  No bradycardia arrhythmias.  No pauses.  Normal sinus rhythm  without ectopy.   Image: CT chest  10/25/2018   normal- r/o PE   TRANSTHORACIC ECHOCARDIOGRAM  11/26/2018   EF 60-65%.  Normal study.  Completed presyncope.   US  CAROTID DOPPLER BILATERAL ([facility] HX)  10/24/2018   Arteriolosclerosis.  Findings consistent with less than 50% stenosis.  Bilateral vertebral blood flow demonstrated.   Family History  Problem Relation Age of Onset   Alzheimer's disease Mother        6: 8 children on her mother's side have Alzheimer's.   Bone cancer Father    Early death Brother    Early death Maternal Grandfather    Heart attack Maternal Grandfather    Social History   Tobacco Use   Smoking status: Never   Smokeless tobacco: Never  Vaping Use   Vaping status: Never Used  Substance Use Topics   Alcohol use: Yes   Drug use: Never   Current Outpatient Medications  Medication Sig Dispense Refill   alendronate  (FOSAMAX ) 70 MG tablet Take 1 tablet (70 mg total) by mouth every 7 (seven) days. Take with a full glass of water on an empty stomach. Must remain upright for 30 minutes 12 tablet 3   amLODipine  (NORVASC ) 5 MG tablet Take 1 tablet (5 mg total) by mouth daily. 90 tablet 1   buPROPion  (WELLBUTRIN  XL) 300 MG 24 hr tablet TAKE 1 TABLET(300 MG) BY MOUTH DAILY 90  tablet 1   diclofenac  (VOLTAREN ) 75 MG EC tablet Take 1 tablet (75 mg total) by mouth 2 (two) times daily. 180 tablet 1   fluticasone  (FLONASE ) 50 MCG/ACT nasal spray Place 2 sprays into both nostrils daily. 16 g 6   rosuvastatin  (CRESTOR ) 20 MG tablet TAKE ONE TABLET BY MOUTH ONE TIME DAILY AT BEDTIME 90 tablet 0   tiZANidine  (ZANAFLEX ) 4 MG tablet Take 0.5-1 tablets (2-4 mg total) by mouth 2 (two) times daily as needed for muscle spasms. 60 tablet 5   triamterene -hydrochlorothiazide (MAXZIDE-25) 37.5-25 MG tablet Take 1 tablet by mouth daily. 90 tablet 1   No current facility-administered medications for this visit.   Allergies  Allergen Reactions   Lisinopril  Cough     Review of  Systems: All systems reviewed and negative except where noted in HPI.    No results found.  Physical Exam: BP (!) 148/84   Pulse 78   Ht 5' 2 (1.575 m)   Wt 142 lb 6.4 oz (64.6 kg)   BMI 26.05 kg/m  Constitutional: Pleasant,well-developed, Caucasian female in no acute distress. HEENT: Normocephalic and atraumatic. Conjunctivae are normal. No scleral icterus. Neck supple.  Cardiovascular: Normal rate, regular rhythm.  Pulmonary/chest: Effort normal and breath sounds normal. No wheezing, rales or rhonchi. Abdominal: Soft, nondistended, nontender. Bowel sounds active throughout. There are no masses palpable. No hepatomegaly. Extremities: no edema Neurological: Alert and oriented to person place and time. Skin: Skin is warm and dry. No rashes noted. Psychiatric: Normal mood and affect. Behavior is normal.  CBC    Component Value Date/Time   WBC 4.9 11/23/2023 1057   RBC 4.57 11/23/2023 1057   HGB 13.2 11/23/2023 1057   HCT 40.0 11/23/2023 1057   PLT 249.0 11/23/2023 1057   MCV 87.4 11/23/2023 1057   MCHC 33.0 11/23/2023 1057   RDW 14.8 11/23/2023 1057    CMP     Component Value Date/Time   NA 138 11/23/2023 1057   NA 133 (A) 10/24/2018 0000   K 4.6 11/23/2023 1057   CL 103 11/23/2023 1057   CO2 28 11/23/2023 1057   GLUCOSE 95 11/23/2023 1057   BUN 14 11/23/2023 1057   BUN 14 10/24/2018 0000   CREATININE 0.96 11/23/2023 1057   CALCIUM  9.4 11/23/2023 1057   PROT 6.6 11/23/2023 1057   ALBUMIN 4.5 11/23/2023 1057   AST 15 11/23/2023 1057   ALT 11 11/23/2023 1057   ALKPHOS 38 (L) 11/23/2023 1057   BILITOT 0.3 11/23/2023 1057       Latest Ref Rng & Units 11/23/2023   10:57 AM 12/22/2022   11:01 AM 01/14/2022   10:00 AM  CBC EXTENDED  WBC 4.0 - 10.5 K/uL 4.9  4.5  4.7   RBC 3.87 - 5.11 Mil/uL 4.57  4.37  4.32   Hemoglobin 12.0 - 15.0 g/dL 86.7  87.1  87.1   HCT 36.0 - 46.0 % 40.0  38.6  37.9   Platelets 150.0 - 400.0 K/uL 249.0  255.0  237.0        ASSESSMENT AND PLAN:  73 year old female referred for colon cancer screening.  She reports having normal colonoscopies in the past.  Following this encounter, her previous gastroenterologist office was contacted and confirmed that she had a normal colonoscopy in 2016, and was recommended to repeat colonoscopy in August 2026.  Colorectal cancer screening Previous colonoscopies normal per patient. No family history of colon cancer. Regular bowel movements. Last colonoscopy eight years ago. - Schedule colonoscopy in  August 2026.  No repeat office visit necessary - Request exact details of previous colonoscopies from previous gastroenterologist in [facility] [place]      Gates E. Stacia, MD [facility]  ------------------------------------------------------------------------  ADDENDUM: Colonoscopy January 01, 2015 Dr. Eulah Dade Indication:  Average risk screening Prior colonoscopy 08/11/2004 Findings:  Tortuous colon, subtle melanosis changes in the descending and sigmoid colon, otherwise normal Repeat 10 years.  Kuneff, Renee A, DO

## 2024-05-03 ENCOUNTER — Other Ambulatory Visit: Payer: Self-pay | Admitting: Family Medicine

## 2024-05-06 ENCOUNTER — Other Ambulatory Visit: Payer: Self-pay | Admitting: Family Medicine

## 2024-05-29 ENCOUNTER — Encounter: Payer: Self-pay | Admitting: Family Medicine

## 2024-05-29 NOTE — Telephone Encounter (Signed)
 No further action needed.

## 2024-06-05 ENCOUNTER — Other Ambulatory Visit: Payer: Self-pay | Admitting: Family Medicine

## 2024-06-15 ENCOUNTER — Other Ambulatory Visit: Payer: Self-pay | Admitting: Family Medicine

## 2024-06-27 ENCOUNTER — Other Ambulatory Visit: Payer: Self-pay

## 2024-06-27 MED ORDER — ROSUVASTATIN CALCIUM 20 MG PO TABS: ORAL_TABLET | ORAL | 0 refills | Status: DC

## 2024-06-27 NOTE — Telephone Encounter (Signed)
 No further action needed at this time.

## 2024-06-29 ENCOUNTER — Other Ambulatory Visit: Payer: Self-pay | Admitting: Family Medicine

## 2024-07-04 ENCOUNTER — Ambulatory Visit: Admitting: Family Medicine

## 2024-07-04 ENCOUNTER — Encounter: Payer: Self-pay | Admitting: Family Medicine

## 2024-07-04 VITALS — BP 122/76 | HR 68 | Temp 97.9°F | Wt 143.4 lb

## 2024-07-04 DIAGNOSIS — M542 Cervicalgia: Secondary | ICD-10-CM

## 2024-07-04 DIAGNOSIS — H8110 Benign paroxysmal vertigo, unspecified ear: Secondary | ICD-10-CM

## 2024-07-04 DIAGNOSIS — Z1231 Encounter for screening mammogram for malignant neoplasm of breast: Secondary | ICD-10-CM

## 2024-07-04 DIAGNOSIS — M1990 Unspecified osteoarthritis, unspecified site: Secondary | ICD-10-CM | POA: Insufficient documentation

## 2024-07-04 DIAGNOSIS — M545 Low back pain, unspecified: Secondary | ICD-10-CM

## 2024-07-04 DIAGNOSIS — M150 Primary generalized (osteo)arthritis: Secondary | ICD-10-CM

## 2024-07-04 DIAGNOSIS — R413 Other amnesia: Secondary | ICD-10-CM | POA: Insufficient documentation

## 2024-07-04 DIAGNOSIS — M810 Age-related osteoporosis without current pathological fracture: Secondary | ICD-10-CM

## 2024-07-04 DIAGNOSIS — E782 Mixed hyperlipidemia: Secondary | ICD-10-CM

## 2024-07-04 DIAGNOSIS — I10 Essential (primary) hypertension: Secondary | ICD-10-CM

## 2024-07-04 DIAGNOSIS — F419 Anxiety disorder, unspecified: Secondary | ICD-10-CM

## 2024-07-04 MED ORDER — ROSUVASTATIN CALCIUM 20 MG PO TABS: ORAL_TABLET | ORAL | 3 refills | Status: AC

## 2024-07-04 MED ORDER — BUPROPION HCL ER (XL) 300 MG PO TB24: ORAL_TABLET | ORAL | 1 refills | Status: AC

## 2024-07-04 MED ORDER — TIZANIDINE HCL 4 MG PO TABS: 2.0000 mg | ORAL_TABLET | Freq: Two times a day (BID) | ORAL | 5 refills | Status: AC | PRN

## 2024-07-04 MED ORDER — TRIAMTERENE-HCTZ 37.5-25 MG PO TABS: 1.0000 | ORAL_TABLET | Freq: Every day | ORAL | 1 refills | Status: AC

## 2024-07-04 MED ORDER — ALENDRONATE SODIUM 70 MG PO TABS: 70.0000 mg | ORAL_TABLET | ORAL | 3 refills | Status: AC

## 2024-07-04 MED ORDER — AMLODIPINE BESYLATE 5 MG PO TABS: 5.0000 mg | ORAL_TABLET | Freq: Every day | ORAL | 1 refills | Status: AC

## 2024-07-04 MED ORDER — DICLOFENAC SODIUM 75 MG PO TBEC: 75.0000 mg | DELAYED_RELEASE_TABLET | Freq: Two times a day (BID) | ORAL | 1 refills | Status: AC

## 2024-07-04 NOTE — Patient Instructions (Addendum)
 Return in about 24 weeks (around 12/19/2024) for Routine chronic condition follow-up.        Great to see you today.  I have refilled the medication(s) we provide.   If labs were collected or images ordered, we will inform you of  results once we have received them and reviewed. We will contact you either by echart message, or telephone call.  Please give ample time to the testing facility, and our office to run,  receive and review results. Please do not call inquiring of results, even if you can see them in your chart. We will contact you as soon as we are able. If it has been over 1 week since the test was completed, and you have not yet heard from us , then please call us .    - echart message- for normal results that have been seen by the patient already.   - telephone call: abnormal results or if patient has not viewed results in their echart.  If a referral to a specialist was entered for you, please call us  in 2 weeks if you have not heard from the specialist office to schedule.

## 2024-07-04 NOTE — Progress Notes (Signed)
 "    Patient ID: Denise Rios, female  DOB: [birth year], 74 y.o.   MRN: [id number] Patient Care Team    Relationship Specialty Notifications Start End  Catherine Charlies DELENA, DO PCP - General Family Medicine  02/20/19   Leni Marjory MATSU, MD  Rheumatology  11/23/23   Joane Artist RAMAN, MD Consulting Physician Sports Medicine  11/23/23   Kathrene Odella Jansky, [place]  Anesthesiology  11/23/23     Chief Complaint  Patient presents with   Hypertension   Subjective: Denise Rios is a 74 y.o.  female present for chronic condition management Medication reconciliation completed today. Past medical history hyperlipidemia changes if appropriate.  Neck pain/arthralgia/lumbar back pain: She completed PT and has prescription for diclofenac .  She has seen rheumatology which she states did not have much to offer.  Today she reports overall ok. Discomfort if up moving a lot, but meds help.  Prior note: complaints of neck pain of greater than 1 year duration.  Associated symptoms include painful range of motion.  Patient reports pain will radiate from right sided neck down to bilateral shoulders.  Denies numbness and tingling upper extremities.  Denies dizziness. Pt has tried diclofenac  twice daily to ease their symptoms.  Patient reports ever since she had a syncopal episode about 5 years ago she has had discomfort in her neck.  Worsening over the last year.  Patient also has suffered from lower lumbar back pain for a couple years and prescribed diclofenac .  She reports she is still having frequent low back flares where she is unable to be active and has to lay on the couch.  She has worked with sports med in the past and underwent physical therapy, neither were helpful. Lumbar spine x-ray 11/10/2022 resulted with mild lower lumbar facet arthropathy. Patient reports pain will wrap around her hips, but denies radiation of pain to buttocks or lower extremities.  No numbness and tingling of lower extremities.  Points to  the location of L4-L5-S1 Patient reports compliance with diclofenac  twice daily.  She does not feel it takes away the pain on many days.  Patient reports her lower back is starting to hurt more frequently, and taking longer to respond to rest and medication. She underwent arthritic workup.  Currently seeing sports med and physical therapy. Her MRI of the lumbar spine confirms mild arthritis changes where the nerves exit from her spinal column at the level of L3-S1.  These changes are described as mild.  She also has a mild disc bulge at the L3-L4 and L4-L5 level.  This correlated with her symptoms. referral to PMR-Dr. Ramo's placed 10/12/2023  Anxiety: Patient reports compliance with Wellbutrin  300 mg XL daily.  She feels medication is working for her.  No complaints. Initial note: Patient reports she started to have anxiety 2/2 to dwelling on dementia about 2020.   She has a strong family history of dementia on her mother side.  6 of 8 children on her mother side have been diagnosed with Alzheimer's.  Her mother died in her 66s with Alzheimer's.  She presented to her PCP with these concerns and she was started on Wellbutrin  to help her with her anxiety and focus.   Patient reports compliance with Wellbutrin  300 mg daily and feels it is working well for her. She does admit today that she feels like her memory has been declining intermittently recently.  She gives 2 examples of repeating story to the same person 12 hours later and not  recalling she had told her the story.  She also gives an example of she and her family playing cards, it was a card game she knew well and had been playing for many years, but she made mistakes and confused the rules etc.  Hypertension/HLD/overweight: Pt reports compliance with amlodipine  5 mg daily, Crestor  20 mg daily and with Maxide.  Patient denies chest pain, shortness of breath, dizziness or lower extremity edema.     Syncope/vertigo:  No current episodes. Prior  note: Patient reports in May 2021 she had an event where she had a syncopal episode.  She reports she had no symptoms prior to passing out other than becoming very dizzy and then blacked out.  She states it was a an extremely hot day and they were looking for a new home in the area.  She felt extremely hot and tried to walk outside to get some air, and then was found passed out.  She states she was only passed out for less than 2 minutes.  She does think she hit her head on the sidewalk on the way down.  She was taken to the emergency room with a negative work-up.  She states she did hit her head during that time.  Since then she has had some room spinning vertigo when looking towards the left and sitting forward.  She had cardiac echo and event monitoring for work-up for her her syncope without positive findings.   Review of Systems  Constitutional: Negative.   HENT: Negative.    Eyes: Negative.   Respiratory: Negative.    Cardiovascular: Negative.   Gastrointestinal: Negative.   Genitourinary: Negative.   Musculoskeletal: Negative.   Skin: Negative.   Neurological: Negative.   Endo/Heme/Allergies: Negative.   Psychiatric/Behavioral: Negative.    All other systems reviewed and are negative.   Event monitoring 01/10/2019 -Predominant normal sinus rhythm.  The heart rate ranged from 54 to 138 bpm and the average heart rate was 75 bpm. There was no atrial fibrillation. There was rare ectopic beats. There was one 5 beat episode of nonsustained ventricular tachycardia. No bradycardia arrhythmias and no pauses. Symptoms correlate with normal sinus rhythm without ectopy.  -Transthoracic echo 11/26/2018: Left ventricle: Cavity size is normal.  Wall thickness is normal.  Systolic function is normal with an estimated EF of 60-65%. Left atrium: Volume index is normal. Right atrium: Normal in size Right ventricle: Cavity size appears normal.  Systolic function is normal.  US  CAROTID DOPPLER  BILATERAL ([facility] HX)  10/24/2018 Arteriolosclerosis.  Findings consistent with less than 50% stenosis.  Bilateral vertebral blood flow demonstrate       07/04/2024   10:45 AM 11/23/2023   10:26 AM 09/27/2023   11:36 AM 12/22/2022   10:52 AM 11/10/2022    9:36 AM  Depression screen PHQ 2/9  Decreased Interest 0 0 0 0 0  Down, Depressed, Hopeless 0 0 0 0 0  PHQ - 2 Score 0 0 0 0 0  Altered sleeping 0 1 1    Tired, decreased energy 0 0 0    Change in appetite 0 1 0    Feeling bad or failure about yourself  0 0 0    Trouble concentrating 0 1 0    Moving slowly or fidgety/restless 0 0 0    Suicidal thoughts 0 0 0    PHQ-9 Score 0 3  1     Difficult doing work/chores Not difficult at all Not difficult at all Not difficult at  all       Data saved with a previous flowsheet row definition      07/04/2024   10:45 AM 11/23/2023   10:27 AM 01/14/2022    9:45 AM 07/30/2021    1:10 PM  GAD 7 : Generalized Anxiety Score  Nervous, Anxious, on Edge 0 0  0  0   Control/stop worrying 0 0  0  0   Worry too much - different things 0 0  0  0   Trouble relaxing 0 1  1  0   Restless 0 1  0  0   Easily annoyed or irritable 0 0  0  0   Afraid - awful might happen 0 0  1  0   Total GAD 7 Score 0 2 2 0  Anxiety Difficulty Not difficult at all Not difficult at all       Data saved with a previous flowsheet row definition           07/04/2024   10:45 AM 11/23/2023   10:26 AM 09/27/2023   11:32 AM 12/22/2022   10:52 AM 11/10/2022    9:36 AM  Fall Risk   Falls in the past year? 0 0 0 1 1  Number falls in past yr: 0  0 0 0  Injury with Fall? 0  0  0  0   Risk for fall due to : No Fall Risks    No Fall Risks  Follow up Falls evaluation completed Falls evaluation completed Falls evaluation completed;Education provided;Falls prevention discussed Falls evaluation completed Falls evaluation completed     Data saved with a previous flowsheet row definition     Immunization History  Administered Date(s)  Administered   INFLUENZA, HIGH DOSE SEASONAL PF 06/20/2024   Moderna Covid-19 Vaccine Bivalent Booster 51yrs & up 01/15/2022   PFIZER(Purple Top)SARS-COV-2 Vaccination 06/21/2019, 07/11/2019, 04/15/2020   Pfizer Covid-19 Vaccine Bivalent Booster 53yrs & up 02/16/2021   Zoster Recombinant(Shingrix) 03/26/2013, 12/28/2021, 03/22/2022    No results found.  Past Medical History:  Diagnosis Date   Anhedonia    Anxiety    Arteriosclerosis of carotid artery 03/19/2019   Less than 50%   Chicken pox    Hematuria 12/19/2017   History of fainting spells of unknown cause    Hyperlipidemia    Hypertension    Memory loss    prioor PCP records indicate neuro referral was made   Menopause    Osteoporosis    Rotator cuff (capsule) sprain    R>L   Syncope 09/2018   Vitamin D  deficiency    Allergies  Allergen Reactions   Lisinopril  Cough   Past Surgical History:  Procedure Laterality Date   CESAREAN SECTION  1980   ELECTROCARDIOGRAM  10/24/2018   SR. HR74, PR 167, QTC 406, NL-EKG   event monitor  01/10/2019   Predominant normal sinus rhythm.  HR 54-1 38.  Average HR 75.  No atrial fib.  Rare ectopic beats.  5 beat episode of nonsustained ventricular tachycardia x1.  No bradycardia arrhythmias.  No pauses.  Normal sinus rhythm without ectopy.   Image: CT chest  10/25/2018   normal- r/o PE   TRANSTHORACIC ECHOCARDIOGRAM  11/26/2018   EF 60-65%.  Normal study.  Completed presyncope.   US  CAROTID DOPPLER BILATERAL ([facility] HX)  10/24/2018   Arteriolosclerosis.  Findings consistent with less than 50% stenosis.  Bilateral vertebral blood flow demonstrated.   Family History  Problem Relation Age of Onset  Alzheimer's disease Mother        6: 8 children on her mother's side have Alzheimer's.   Bone cancer Father    Early death Brother    Early death Maternal Grandfather    Heart attack Maternal Grandfather    Social History   Social History Narrative   Marital status/children/pets:  married, 2 children.    Education/employment: retired   Field Seismologist:      -smoke alarm in the home:Yes     - wears seatbelt: Yes     - Feels safe in their relationships: Yes    Allergies as of 07/04/2024       Reactions   Lisinopril  Cough        Medication List        Accurate as of July 04, 2024 11:09 AM. If you have any questions, ask your nurse or doctor.          STOP taking these medications    fluticasone  50 MCG/ACT nasal spray Commonly known as: FLONASE  Stopped by: Charlies Bellini, DO       TAKE these medications    alendronate  70 MG tablet Commonly known as: FOSAMAX  Take 1 tablet (70 mg total) by mouth every 7 (seven) days. Take with a full glass of water on an empty stomach. Must remain upright for 30 minutes   amLODipine  5 MG tablet Commonly known as: NORVASC  Take 1 tablet (5 mg total) by mouth daily.   buPROPion  300 MG 24 hr tablet Commonly known as: WELLBUTRIN  XL TAKE 1 TABLET(300 MG) BY MOUTH DAILY   diclofenac  75 MG EC tablet Commonly known as: VOLTAREN  Take 1 tablet (75 mg total) by mouth 2 (two) times daily.   rosuvastatin  20 MG tablet Commonly known as: CRESTOR  TAKE ONE TABLET BY MOUTH ONE TIME DAILY AT BEDTIME   tiZANidine  4 MG tablet Commonly known as: Zanaflex  Take 0.5-1 tablets (2-4 mg total) by mouth 2 (two) times daily as needed for muscle spasms.   triamterene -hydrochlorothiazide 37.5-25 MG tablet Commonly known as: MAXZIDE-25 Take 1 tablet by mouth daily.        All past medical history, surgical history, allergies, family history, immunizations andmedications were updated in the EMR today and reviewed under the history and medication portions of their EMR.    No results found for this or any previous visit (from the past 2160 hours).   Patient was never admitted.   ROS: 14 pt review of systems performed and negative (unless mentioned in an HPI)  Objective: BP 122/76   Pulse 68   Temp 97.9 F (36.6 C)   Wt 143 lb 6.4  oz (65 kg)   SpO2 98%   BMI 26.23 kg/m  Physical Exam Vitals and nursing note reviewed.  Constitutional:      General: She is not in acute distress.    Appearance: Normal appearance. She is not ill-appearing, toxic-appearing or diaphoretic.  HENT:     Head: Normocephalic and atraumatic.  Eyes:     General: No scleral icterus.       Right eye: No discharge.        Left eye: No discharge.     Extraocular Movements: Extraocular movements intact.     Conjunctiva/sclera: Conjunctivae normal.     Pupils: Pupils are equal, round, and reactive to light.  Cardiovascular:     Rate and Rhythm: Normal rate and regular rhythm.     Heart sounds: No murmur heard. Pulmonary:     Effort: Pulmonary effort is  normal. No respiratory distress.     Breath sounds: Normal breath sounds. No wheezing, rhonchi or rales.  Musculoskeletal:     Cervical back: Neck supple.     Right lower leg: No edema.     Left lower leg: No edema.  Skin:    General: Skin is warm.     Findings: No rash.  Neurological:     Mental Status: She is alert and oriented to person, place, and time. Mental status is at baseline.     Motor: No weakness.     Gait: Gait normal.  Psychiatric:        Mood and Affect: Mood normal.        Behavior: Behavior normal.        Thought Content: Thought content normal.        Judgment: Judgment normal.     Assessment/plan: EDRIS SCHNECK is a 74 y.o. female present for Chronic Conditions/illness Management Essential hypertension/hyperlipidemia/overweight Stable Continue Maxide 37.5-25 mg 1 tab daily Continue amlodipine  5 mg qd continue Crestor : Goal : < 135/85.  If above goal she will make an appointment to follow-up. -Low-sodium diet and exercise encouraged. Labs up-to-date 10/2023-due next visit  Anxiousness: Stable Continue Wellbutrin  300 mg XL QD  Osteoporosis, unspecified osteoporosis type, unspecified pathological fracture presence/Vitamin D  deficiency Dexa 04/10/2023:  (-3.9) 2 yr> ordered for November 2026 [facility] P - Vitamin D  (25 hydroxy) UTD levels-WNL  Neck pain/lumbar pain/arthralgia: stable Continue diclofenac  twice daily as needed Continue Zanaflex  3 times daily as needed Heat, massage, stretches recommended with flares. Patient completed physical therapy. Patient established with rheumatology, which she states did not have much to offer.  Memory decline/family history of Alzheimer's disease in mother and 6 siblings: B12, vitamin D  and thyroid  panel were within normal limits. Strong family history of Alzheimer's We discussed APOE risk labs and she declined today We discussed MRI-she elected to wait We discussed medications she would like to wait for now  Breast cancer screening 3D mammogram ordered for after 03/26/2025-had mobile mammogram last year.  Return in about 24 weeks (around 12/19/2024) for Routine chronic condition follow-up.   Meds ordered this encounter  Medications   alendronate  (FOSAMAX ) 70 MG tablet    Sig: Take 1 tablet (70 mg total) by mouth every 7 (seven) days. Take with a full glass of water on an empty stomach. Must remain upright for 30 minutes    Dispense:  12 tablet    Refill:  3   amLODipine  (NORVASC ) 5 MG tablet    Sig: Take 1 tablet (5 mg total) by mouth daily.    Dispense:  90 tablet    Refill:  1   buPROPion  (WELLBUTRIN  XL) 300 MG 24 hr tablet    Sig: TAKE 1 TABLET(300 MG) BY MOUTH DAILY    Dispense:  90 tablet    Refill:  1   rosuvastatin  (CRESTOR ) 20 MG tablet    Sig: TAKE ONE TABLET BY MOUTH ONE TIME DAILY AT BEDTIME    Dispense:  90 tablet    Refill:  3   tiZANidine  (ZANAFLEX ) 4 MG tablet    Sig: Take 0.5-1 tablets (2-4 mg total) by mouth 2 (two) times daily as needed for muscle spasms.    Dispense:  60 tablet    Refill:  5   triamterene -hydrochlorothiazide (MAXZIDE-25) 37.5-25 MG tablet    Sig: Take 1 tablet by mouth daily.    Dispense:  90 tablet    Refill:  1   diclofenac  (  VOLTAREN ) 75 MG EC  tablet    Sig: Take 1 tablet (75 mg total) by mouth 2 (two) times daily.    Dispense:  180 tablet    Refill:  1   Orders Placed This Encounter  Procedures   DG Bone Density   MM 3D SCREENING MAMMOGRAM BILATERAL BREAST    Note is dictated utilizing voice recognition software. Although note has been proof read prior to signing, occasional typographical errors still can be missed. If any questions arise, please do not hesitate to call for verification.  Electronically signed by: Charlies Bellini, DO [facility]  "

## 2024-07-05 ENCOUNTER — Other Ambulatory Visit: Payer: Self-pay | Admitting: Family Medicine

## 2024-10-02 ENCOUNTER — Encounter

## 2024-12-18 ENCOUNTER — Ambulatory Visit: Admitting: Family Medicine
